# Patient Record
Sex: Female | Born: 1955
Health system: Southern US, Community
[De-identification: ages and names within clinical notes are randomized; demographics above are authoritative.]

## PROBLEM LIST (undated history)

## (undated) DIAGNOSIS — J302 Other seasonal allergic rhinitis: Secondary | ICD-10-CM

## (undated) DIAGNOSIS — F329 Major depressive disorder, single episode, unspecified: Secondary | ICD-10-CM

## (undated) DIAGNOSIS — I10 Essential (primary) hypertension: Secondary | ICD-10-CM

## (undated) DIAGNOSIS — F419 Anxiety disorder, unspecified: Secondary | ICD-10-CM

## (undated) DIAGNOSIS — F32A Depression, unspecified: Secondary | ICD-10-CM

## (undated) DIAGNOSIS — T7840XA Allergy, unspecified, initial encounter: Secondary | ICD-10-CM

## (undated) DIAGNOSIS — D573 Sickle-cell trait: Secondary | ICD-10-CM

## (undated) DIAGNOSIS — N3281 Overactive bladder: Secondary | ICD-10-CM

## (undated) DIAGNOSIS — Z862 Personal history of diseases of the blood and blood-forming organs and certain disorders involving the immune mechanism: Secondary | ICD-10-CM

## (undated) DIAGNOSIS — M1990 Unspecified osteoarthritis, unspecified site: Secondary | ICD-10-CM

## (undated) DIAGNOSIS — D259 Leiomyoma of uterus, unspecified: Secondary | ICD-10-CM

## (undated) DIAGNOSIS — M545 Low back pain, unspecified: Secondary | ICD-10-CM

## (undated) DIAGNOSIS — Z789 Other specified health status: Secondary | ICD-10-CM

## (undated) HISTORY — DX: Depression, unspecified: F32.A

## (undated) HISTORY — DX: Essential (primary) hypertension: I10

## (undated) HISTORY — DX: Low back pain: M54.5

## (undated) HISTORY — DX: Anxiety disorder, unspecified: F41.9

## (undated) HISTORY — DX: Leiomyoma of uterus, unspecified: D25.9

## (undated) HISTORY — DX: Sickle-cell trait: D57.3

## (undated) HISTORY — DX: Personal history of diseases of the blood and blood-forming organs and certain disorders involving the immune mechanism: Z86.2

## (undated) HISTORY — PX: TUBAL LIGATION: SHX77

## (undated) HISTORY — DX: Unspecified osteoarthritis, unspecified site: M19.90

## (undated) HISTORY — DX: Major depressive disorder, single episode, unspecified: F32.9

## (undated) HISTORY — PX: TUMOR REMOVAL: SHX12

## (undated) HISTORY — DX: Other seasonal allergic rhinitis: J30.2

## (undated) HISTORY — DX: Overactive bladder: N32.81

## (undated) HISTORY — DX: Low back pain, unspecified: M54.50

## (undated) HISTORY — DX: Other specified health status: Z78.9

## (undated) HISTORY — DX: Allergy, unspecified, initial encounter: T78.40XA

---

## 1985-10-11 HISTORY — PX: BUNIONECTOMY: SHX129

## 2005-10-11 ENCOUNTER — Encounter (INDEPENDENT_AMBULATORY_CARE_PROVIDER_SITE_OTHER): Payer: Self-pay | Admitting: Family Medicine

## 2005-10-11 LAB — CONVERTED CEMR LAB

## 2006-05-31 ENCOUNTER — Ambulatory Visit: Payer: Self-pay | Admitting: Family Medicine

## 2006-06-01 ENCOUNTER — Ambulatory Visit: Payer: Self-pay | Admitting: *Deleted

## 2006-06-20 ENCOUNTER — Ambulatory Visit: Payer: Self-pay | Admitting: Family Medicine

## 2006-06-20 LAB — CONVERTED CEMR LAB
RBC count: 4.84 10*6/uL
TSH: 1.075 microintl units/mL
WBC, blood: 5.5 10*3/uL

## 2006-06-22 ENCOUNTER — Ambulatory Visit (HOSPITAL_COMMUNITY): Admission: RE | Admit: 2006-06-22 | Discharge: 2006-06-22 | Payer: Self-pay | Admitting: Family Medicine

## 2006-09-13 ENCOUNTER — Ambulatory Visit: Payer: Self-pay | Admitting: Family Medicine

## 2006-11-29 ENCOUNTER — Ambulatory Visit: Payer: Self-pay | Admitting: Internal Medicine

## 2007-03-14 ENCOUNTER — Emergency Department (HOSPITAL_COMMUNITY): Admission: EM | Admit: 2007-03-14 | Discharge: 2007-03-14 | Payer: Self-pay | Admitting: Emergency Medicine

## 2007-05-11 ENCOUNTER — Encounter (INDEPENDENT_AMBULATORY_CARE_PROVIDER_SITE_OTHER): Payer: Self-pay | Admitting: Gynecology

## 2007-05-11 ENCOUNTER — Ambulatory Visit: Payer: Self-pay | Admitting: Gynecology

## 2007-05-19 ENCOUNTER — Ambulatory Visit: Payer: Self-pay | Admitting: Family Medicine

## 2007-05-19 LAB — CONVERTED CEMR LAB: Urinalysis: NORMAL

## 2007-05-23 ENCOUNTER — Ambulatory Visit (HOSPITAL_COMMUNITY): Admission: RE | Admit: 2007-05-23 | Discharge: 2007-05-23 | Payer: Self-pay | Admitting: Family Medicine

## 2007-05-23 LAB — HM MAMMOGRAPHY: HM Mammogram: NORMAL

## 2007-06-15 ENCOUNTER — Encounter (INDEPENDENT_AMBULATORY_CARE_PROVIDER_SITE_OTHER): Payer: Self-pay | Admitting: Family Medicine

## 2007-06-15 DIAGNOSIS — I10 Essential (primary) hypertension: Secondary | ICD-10-CM | POA: Insufficient documentation

## 2007-06-15 DIAGNOSIS — F341 Dysthymic disorder: Secondary | ICD-10-CM | POA: Insufficient documentation

## 2007-06-20 DIAGNOSIS — M5430 Sciatica, unspecified side: Secondary | ICD-10-CM | POA: Insufficient documentation

## 2007-06-20 DIAGNOSIS — D259 Leiomyoma of uterus, unspecified: Secondary | ICD-10-CM | POA: Insufficient documentation

## 2007-06-28 ENCOUNTER — Encounter (INDEPENDENT_AMBULATORY_CARE_PROVIDER_SITE_OTHER): Payer: Self-pay | Admitting: *Deleted

## 2007-07-20 ENCOUNTER — Ambulatory Visit: Payer: Self-pay | Admitting: Internal Medicine

## 2007-07-26 ENCOUNTER — Ambulatory Visit: Payer: Self-pay | Admitting: Family Medicine

## 2007-07-26 LAB — CONVERTED CEMR LAB
Hepatitis B-Post: 9.9 milliintl units/mL
Mumps IgG: 2.45 — ABNORMAL HIGH
Rubella: 85.6 intl units/mL — ABNORMAL HIGH
Rubeola IgG: 1.97 — ABNORMAL HIGH
Varicella IgG: 2.83 — ABNORMAL HIGH

## 2007-07-29 ENCOUNTER — Emergency Department (HOSPITAL_COMMUNITY): Admission: EM | Admit: 2007-07-29 | Discharge: 2007-07-29 | Payer: Self-pay | Admitting: Emergency Medicine

## 2007-10-24 ENCOUNTER — Ambulatory Visit: Payer: Self-pay | Admitting: Family Medicine

## 2007-10-30 ENCOUNTER — Emergency Department (HOSPITAL_COMMUNITY): Admission: EM | Admit: 2007-10-30 | Discharge: 2007-10-30 | Payer: Self-pay | Admitting: Emergency Medicine

## 2008-03-08 ENCOUNTER — Emergency Department (HOSPITAL_COMMUNITY): Admission: EM | Admit: 2008-03-08 | Discharge: 2008-03-08 | Payer: Self-pay | Admitting: Emergency Medicine

## 2008-03-18 ENCOUNTER — Ambulatory Visit: Payer: Self-pay | Admitting: Internal Medicine

## 2008-03-18 DIAGNOSIS — M545 Low back pain, unspecified: Secondary | ICD-10-CM | POA: Insufficient documentation

## 2008-04-15 ENCOUNTER — Telehealth: Payer: Self-pay | Admitting: Internal Medicine

## 2008-04-24 ENCOUNTER — Ambulatory Visit: Payer: Self-pay | Admitting: Internal Medicine

## 2008-06-12 ENCOUNTER — Ambulatory Visit: Payer: Self-pay | Admitting: Internal Medicine

## 2008-06-12 LAB — CONVERTED CEMR LAB
ALT: 20 units/L (ref 0–35)
AST: 25 units/L (ref 0–37)
Albumin: 4.5 g/dL (ref 3.5–5.2)
Alkaline Phosphatase: 79 units/L (ref 39–117)
BUN: 11 mg/dL (ref 6–23)
Basophils Absolute: 0 10*3/uL (ref 0.0–0.1)
Basophils Relative: 0 % (ref 0–1)
CO2: 26 meq/L (ref 19–32)
Calcium: 9.2 mg/dL (ref 8.4–10.5)
Chloride: 105 meq/L (ref 96–112)
Cholesterol: 217 mg/dL — ABNORMAL HIGH (ref 0–200)
Creatinine, Ser: 0.97 mg/dL (ref 0.40–1.20)
Eosinophils Absolute: 0.2 10*3/uL (ref 0.0–0.7)
Eosinophils Relative: 4 % (ref 0–5)
Glucose, Bld: 98 mg/dL (ref 70–99)
HCT: 35.4 % — ABNORMAL LOW (ref 36.0–46.0)
HDL: 66 mg/dL (ref >39)
Hemoglobin: 11.5 g/dL — ABNORMAL LOW (ref 12.0–15.0)
LDL Cholesterol: 139 mg/dL — ABNORMAL HIGH (ref 0–99)
Lymphocytes Relative: 34 % (ref 12–46)
Lymphs Abs: 1.8 10*3/uL (ref 0.7–4.0)
MCHC: 32.5 g/dL (ref 30.0–36.0)
MCV: 78.7 fL (ref 78.0–100.0)
Microalb, Ur: 2.98 mg/dL — ABNORMAL HIGH (ref 0.00–1.89)
Monocytes Absolute: 0.4 10*3/uL (ref 0.1–1.0)
Monocytes Relative: 7 % (ref 3–12)
Neutro Abs: 2.9 10*3/uL (ref 1.7–7.7)
Neutrophils Relative %: 55 % (ref 43–77)
Platelets: 235 10*3/uL (ref 150–400)
Potassium: 3.8 meq/L (ref 3.5–5.3)
RBC: 4.5 M/uL (ref 3.87–5.11)
RDW: 16.2 % — ABNORMAL HIGH (ref 11.5–15.5)
Sodium: 142 meq/L (ref 135–145)
Total Bilirubin: 0.3 mg/dL (ref 0.3–1.2)
Total CHOL/HDL Ratio: 3.3
Total Protein: 7.5 g/dL (ref 6.0–8.3)
Triglycerides: 62 mg/dL (ref <150)
VLDL: 12 mg/dL (ref 0–40)
WBC: 5.4 10*3/uL (ref 4.0–10.5)

## 2008-08-07 ENCOUNTER — Telehealth: Payer: Self-pay | Admitting: Internal Medicine

## 2008-10-24 ENCOUNTER — Ambulatory Visit: Payer: Self-pay | Admitting: Internal Medicine

## 2008-10-24 DIAGNOSIS — N949 Unspecified condition associated with female genital organs and menstrual cycle: Secondary | ICD-10-CM

## 2008-10-24 DIAGNOSIS — N938 Other specified abnormal uterine and vaginal bleeding: Secondary | ICD-10-CM | POA: Insufficient documentation

## 2008-10-24 DIAGNOSIS — N925 Other specified irregular menstruation: Secondary | ICD-10-CM | POA: Insufficient documentation

## 2008-10-25 ENCOUNTER — Encounter: Payer: Self-pay | Admitting: Internal Medicine

## 2008-10-25 ENCOUNTER — Telehealth: Payer: Self-pay | Admitting: Internal Medicine

## 2008-10-26 ENCOUNTER — Encounter: Payer: Self-pay | Admitting: Internal Medicine

## 2008-10-28 ENCOUNTER — Telehealth: Payer: Self-pay | Admitting: Internal Medicine

## 2008-10-29 ENCOUNTER — Encounter: Payer: Self-pay | Admitting: Internal Medicine

## 2008-10-29 ENCOUNTER — Telehealth: Payer: Self-pay | Admitting: Internal Medicine

## 2008-11-05 ENCOUNTER — Telehealth: Payer: Self-pay | Admitting: Internal Medicine

## 2008-12-16 ENCOUNTER — Ambulatory Visit: Payer: Self-pay | Admitting: Family Medicine

## 2008-12-23 ENCOUNTER — Ambulatory Visit (HOSPITAL_COMMUNITY): Admission: RE | Admit: 2008-12-23 | Discharge: 2008-12-23 | Payer: Self-pay | Admitting: Internal Medicine

## 2009-02-13 ENCOUNTER — Ambulatory Visit: Payer: Self-pay | Admitting: Family Medicine

## 2009-05-22 ENCOUNTER — Ambulatory Visit: Payer: Self-pay | Admitting: Family Medicine

## 2009-05-22 LAB — CONVERTED CEMR LAB
BUN: 7 mg/dL (ref 6–23)
CO2: 23 meq/L (ref 19–32)
Calcium: 8.4 mg/dL (ref 8.4–10.5)
Chloride: 105 meq/L (ref 96–112)
Creatinine, Ser: 0.82 mg/dL (ref 0.40–1.20)
Glucose, Bld: 119 mg/dL — ABNORMAL HIGH (ref 70–99)
Potassium: 3.6 meq/L (ref 3.5–5.3)
Sodium: 137 meq/L (ref 135–145)
TSH: 1.139 microintl units/mL (ref 0.350–4.500)
Vit D, 25-Hydroxy: 12 ng/mL — ABNORMAL LOW (ref 30–89)

## 2009-07-29 ENCOUNTER — Encounter: Admission: RE | Admit: 2009-07-29 | Discharge: 2009-09-16 | Payer: Self-pay | Admitting: Family Medicine

## 2009-12-19 ENCOUNTER — Ambulatory Visit: Payer: Self-pay | Admitting: Internal Medicine

## 2009-12-19 DIAGNOSIS — E669 Obesity, unspecified: Secondary | ICD-10-CM | POA: Insufficient documentation

## 2010-01-02 ENCOUNTER — Encounter: Payer: Self-pay | Admitting: Internal Medicine

## 2010-01-09 ENCOUNTER — Telehealth: Payer: Self-pay

## 2010-02-20 ENCOUNTER — Telehealth: Payer: Self-pay | Admitting: Internal Medicine

## 2010-02-24 ENCOUNTER — Encounter: Admission: RE | Admit: 2010-02-24 | Discharge: 2010-03-12 | Payer: Self-pay | Admitting: Internal Medicine

## 2010-03-02 ENCOUNTER — Encounter: Payer: Self-pay | Admitting: Internal Medicine

## 2010-04-02 ENCOUNTER — Encounter: Payer: Self-pay | Admitting: Internal Medicine

## 2010-04-09 ENCOUNTER — Ambulatory Visit: Payer: Self-pay | Admitting: Family Medicine

## 2010-06-22 ENCOUNTER — Telehealth: Payer: Self-pay | Admitting: Internal Medicine

## 2010-07-17 ENCOUNTER — Telehealth: Payer: Self-pay | Admitting: Internal Medicine

## 2010-11-10 NOTE — Letter (Signed)
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   [place], [place] [address]   Phone: [phone]  Fax: [phone]    October 25, 2008   Employee:  Annetta Maw    To Whom It May Concern:   For Medical reasons, please excuse the above named employee from work for the following dates:  Start:   10-24-08  End:   10-31-08  If you need additional information, please feel free to contact our office.         Sincerely,    Gordy Savers  MD  Appended Document: Out of Work ok to return to work 11/04/08 per PK.

## 2010-11-10 NOTE — Miscellaneous (Signed)
Summary: Initial Summary for PT Services/[facility]  Initial Summary for PT Services/[facility]   Imported By: Maryln Gottron 03/05/2010 15:34:53  _____________________________________________________________________  External Attachment:    Type:   Image     Comment:   External Document

## 2010-11-10 NOTE — Progress Notes (Signed)
Summary: Request to Switch PCP  Phone Note Call from Patient Call back at [phone]   Caller: Patient Summary of Call: Pt requests tranfer to Dr. Fabian Sharp.  Please advise if pt can switch pcp. Initial call taken by: Heron Sabins,  July 17, 2010 9:31 AM  Follow-up for Phone Call        ok     if ok with Dr Kirtland Bouchard but  remind her she will be charged for no show appts.   Follow-up by: Madelin Headings MD,  July 19, 2010 10:04 PM  Additional Follow-up for Phone Call Additional follow up Details #1::        OK Additional Follow-up by: Gordy Savers  MD,  July 21, 2010 12:43 PM    Additional Follow-up for Phone Call Additional follow up Details #2::    lmom Follow-up by: Heron Sabins,  July 23, 2010 8:06 AM  Additional Follow-up for Phone Call Additional follow up Details #3:: Details for Additional Follow-up Action Taken: PT IS AWARE OK TO SWITCH TO [facility] Additional Follow-up by: Heron Sabins,  July 27, 2010 9:20 AM

## 2010-11-10 NOTE — Progress Notes (Signed)
Summary: pt ins will not pay for Detrol can she chg to [employer] or [employer] Note Other Incoming   Call placed by: [facility] Call placed to: k Summary of Call: patient insurance will no longer pay for Detrol LA.  They will pay for Enablex or Vesicare.  Can the patient change to one of these?   Initial call taken by: Roselle Locus,  October 28, 2008 3:10 PM  Follow-up for Phone Call        vesicare 15 mg  #90 one daily  RF 4 Follow-up by: Gordy Savers  MD,  October 28, 2008 5:20 PM      Appended Document: pt ins will not pay for Detrol can she chg to Enablex or Vesicar Dr Kirtland Bouchard wrote Vesicare rx on prior authorization form and I faxed it to [facility]   Appended Document: pt ins will not pay for Detrol can she chg to Enablex or Vesicar Medications Added VESICARE 10 MG TABS (SOLIFENACIN SUCCINATE) 1 once daily          Clinical Lists Changes  Medications: Removed medication of DETROL LA 4 MG  CP24 (TOLTERODINE TARTRATE) 1 cap by mouth daily Added new medication of VESICARE 10 MG TABS (SOLIFENACIN SUCCINATE) 1 once daily - Signed Rx of VESICARE 10 MG TABS (SOLIFENACIN SUCCINATE) 1 once daily;  #90 x 4;  Signed;  Entered by: Raechel Ache, RN;  Authorized by: Gordy Savers  MD;  Method used: Historical    Prescriptions: VESICARE 10 MG TABS (SOLIFENACIN SUCCINATE) 1 once daily  #90 x 4   Entered by:   Raechel Ache, RN   Authorized by:   Gordy Savers  MD   Signed by:   Raechel Ache, RN on 10/29/2008   Method used:   Historical   RxID:   [id number]

## 2010-11-10 NOTE — Progress Notes (Signed)
Summary: EFFEXOR XR 75 MG 90 PER MONTH AT LOCAL PHARMACY   Phone Note Other Incoming   Call placed by: [facility] Call placed to: K Summary of Call: EFFEXOR XR 75 MG DOES NOT REQUIRE PRIOR AUTH.  SHE CAN GET 90 PER 30 DAYS FROM LOCAL PHARMACY AND 270 IF SHE USES MAIL ORDER WITH NO PRIOR AUTH  Initial call taken by: Roselle Locus,  November 05, 2008 9:39 AM

## 2010-11-10 NOTE — Progress Notes (Signed)
Summary: rx refill  Phone Note Call from Patient Call back at [phone]   Caller: pt live Call For: K Summary of Call: Patient needs a rx refill for hctz .    [facility] [address]. Initial call taken by: Celine Ahr,  August 07, 2008 2:41 PM      Prescriptions: TRIAMTERENE-HCTZ 37.5-25 MG  CAPS (TRIAMTERENE-HCTZ) one daily  #90 x 3   Entered by:   Raechel Ache, RN   Authorized by:   Gordy Savers  MD   Signed by:   Raechel Ache, RN on 08/08/2008   Method used:   Electronically to        [facility] [address] (retail)       [phone]       [place], [place]  [address]       Ph: [phone]       Fax: [phone]   RxID:   [id number]

## 2010-11-10 NOTE — Progress Notes (Signed)
----   Converted from flag ---- ---- 10/29/2008 12:27 PM, Gordy Savers  MD wrote: 10 mg  ---- 10/29/2008 11:38 AM, Raechel Ache, RN wrote: Assunta Found comes only in 5mg  or 10. ------------------------------

## 2010-11-10 NOTE — Medication Information (Signed)
Summary: Quantity Limit Exception Request Form  Quantity Limit Exception Request Form   Imported By: Maryln Gottron 01/16/2010 13:09:15  _____________________________________________________________________  External Attachment:    Type:   Image     Comment:   External Document

## 2010-11-10 NOTE — Medication Information (Signed)
Summary: Refill Authorization Request-Detrol LA  Refill Authorization Request-Detrol LA   Imported By: Maryln Gottron 11/04/2008 14:37:40  _____________________________________________________________________  External Attachment:    Type:   Image     Comment:   External Document

## 2010-11-10 NOTE — Progress Notes (Signed)
Summary: NO CALL NO SHOW CPX  Phone Note Outgoing Call   Call placed by: Duard Brady LPN,  June 22, 2010 2:48 PM Call placed to: Patient Summary of Call: attempted to call hm# -  "the number or code you have dialed in incorrect , please check the number and try again"   NO CALL NO SHOW for CPX  Initial call taken by: Duard Brady LPN,  June 22, 2010 2:50 PM

## 2010-11-10 NOTE — Progress Notes (Signed)
Summary: out of work note  Phone Note Call from Patient   Caller: Patient Call For: Dr. Kirtland Bouchard Summary of Call: Pt wants an OOW note for next M, T, and Wednesday due to menorrhagia. Call when ready and leave voice message. [phone] Initial call taken by: Lynann Beaver CMA,  October 25, 2008 10:56 AM
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
## 2010-11-10 NOTE — Progress Notes (Signed)
Summary: REQUEST FOR REFERRAL  Phone Note Call from Patient   Caller: Patient    [phone] Summary of Call: Pt called in to request that the referral for a Nutritionist and for Physical Therapy be redone.... Pt adv that she never went to her appts that were scheduled and she wants to have another appt made for her so she can see a Nutritionist and have Physical Therapy.... Pt can be reached at [phone] with any questions or concerns.  Initial call taken by: Debbra Riding,  Feb 20, 2010 12:20 PM  Follow-up for Phone Call        ok to reschedule Follow-up by: Gordy Savers  MD,  Feb 23, 2010 8:01 AM  Additional Follow-up for Phone Call Additional follow up Details #1::        orders done and sent to terri. KIK Additional Follow-up by: Duard Brady LPN,  Feb 23, 2010 11:40 AM

## 2010-11-10 NOTE — Progress Notes (Signed)
Summary: Ins and medication rx  Phone Note Outgoing Call   Call placed by: Duard Brady LPN,  January 09, 2010 4:43 PM Call placed to: Patient Summary of Call: attempt to call r/t  effexor and ins not paying for rx the way it is written. LMTCB to discuss. KIK Initial call taken by: Duard Brady LPN,  January 09, 2010 4:44 PM

## 2010-11-10 NOTE — Assessment & Plan Note (Signed)
Summary: to be est/mhf   Vital Signs:  Patient Profile:   55 Years Old Female Weight:      208 pounds Temp:     98.3 degrees F oral Pulse rate:   92 / minute Pulse rhythm:   regular BP sitting:   162 / 88  (left arm) Cuff size:   regular  Vitals Entered By: Raechel Ache, RN (March 18, 2008 3:13 PM)                 Chief Complaint:  New- to establish. C/o high BP.Marland Kitchen  History of Present Illness: 55 year old, African-American female, who seen to establish with our practice.  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 otherwise she feels well, without concerns or complaints.  Father with a history of hypertension    Current Allergies: No known allergies   Past Medical History:    Reviewed history and no changes required:       Allergic rhinitis       Depression       Low back pain  Past Surgical History:    Reviewed history from 06/15/2007 and no changes required:       S/P Caesarean section and Tubal ligation   (1994)       S/P Foot surgery  (1989)       S/P Removal of Tumor behind Left Eye  (1997)          Family History:    Reviewed history and no changes required:       father died recently at age 21.  History senile dementia, and diabetes, hypertension              Mother is 55 things be in reasonably good health, but daughter unaware of health specifics              One brother in good health       3 sisters positive for schizophrenia and bipolar disorder  Social History:    Single    works in [place] at the psychiatric facility    Never Smoked   Risk Factors:  Tobacco use:  never   Review of Systems  The patient denies anorexia, fever, weight loss, weight gain, vision loss, decreased hearing, hoarseness, chest pain, syncope, dyspnea on exertion, peripheral edema, prolonged cough, headaches, hemoptysis, abdominal pain, melena, hematochezia, severe  indigestion/heartburn, hematuria, incontinence, genital sores, muscle weakness, suspicious skin lesions, transient blindness, difficulty walking, depression, unusual weight change, abnormal bleeding, enlarged lymph nodes, angioedema, and breast masses.     Physical Exam  General:     overweight-appearing.  160/86 Head:     Normocephalic and atraumatic without obvious abnormalities. No apparent alopecia or balding. Eyes:     No corneal or conjunctival inflammation noted. EOMI. Perrla. Funduscopic exam benign, without hemorrhages, exudates or papilledema. Vision grossly normal. Ears:     External ear exam shows no significant lesions or deformities.  Otoscopic examination reveals clear canals, tympanic membranes are intact bilaterally without bulging, retraction, inflammation or discharge. Hearing is grossly normal bilaterally. Mouth:     Oral mucosa and oropharynx without lesions or exudates.  Teeth in good repair. Neck:     No deformities, masses, or tenderness noted. Chest Wall:     No deformities, masses, or tenderness noted. Breasts:     No mass, nodules, thickening, tenderness, bulging, retraction, inflamation, nipple discharge or  skin changes noted.   Lungs:     Normal respiratory effort, chest expands symmetrically. Lungs are clear to auscultation, no crackles or wheezes. Heart:     Normal rate and regular rhythm. S1 and S2 normal without gallop, murmur, click, rub or other extra sounds. Abdomen:     Bowel sounds positive,abdomen soft and non-tender without masses, organomegaly or hernias noted. Msk:     No deformity or scoliosis noted of thoracic or lumbar spine.   Pulses:     R and L carotid,radial,femoral,dorsalis pedis and posterior tibial pulses are full and equal bilaterally Extremities:     No clubbing, cyanosis, edema, or deformity noted with normal full range of motion of all joints.   Neurologic:     No cranial nerve deficits noted. Station and gait are normal.  Plantar reflexes are down-going bilaterally. DTRs are symmetrical throughout. Sensory, motor and coordinative functions appear intact. Skin:     Intact without suspicious lesions or rashes Cervical Nodes:     No lymphadenopathy noted Axillary Nodes:     No palpable lymphadenopathy Inguinal Nodes:     No significant adenopathy Psych:     Cognition and judgment appear intact. Alert and cooperative with normal attention span and concentration. No apparent delusions, illusions, hallucinations    Impression & Recommendations:  Problem # 1:  HYPERTENSION, BENIGN (ICD-401.1)  Her updated medication list for this problem includes:    Triamterene-hctz 37.5-25 Mg Caps (Triamterene-hctz) ..... One daily   Problem # 2:  DEPRESSION (ICD-311)  Her updated medication list for this problem includes:    Effexor Xr 75 Mg Cp24 (Venlafaxine hcl) .Marland KitchenMarland KitchenMarland KitchenMarland Kitchen 3 caps by mouth daily   Problem # 3:  ALLERGIC RHINITIS (ICD-477.9)  Complete Medication List: 1)  Effexor Xr 75 Mg Cp24 (Venlafaxine hcl) .... 3 caps by mouth daily 2)  Ibuprofen 800 Mg Tabs (Ibuprofen) .Marland Kitchen.. 1 tab by mouth every 8 hours as needed for pain 3)  Detrol La 4 Mg Cp24 (Tolterodine tartrate) .Marland Kitchen.. 1 cap by mouth daily 4)  Arthrotec 75 75-200 Mg-mcg Tabs (Diclofenac-misoprostol) .Marland Kitchen.. 1 tab by mouth two times a day with meals 5)  Hemocyte Plus 106-1 Mg Caps (B complex-c-min-fe-fa) .Marland Kitchen.. 1 cap by mouth q day with orange juice 6)  Loratidine 10 Mg  .Marland Kitchen.. 1 tab by mouth daily for allergies 7)  Triamterene-hctz 37.5-25 Mg Caps (Triamterene-hctz) .... One daily   Patient Instructions: 1)  Please schedule a follow-up appointment in 1 month. 2)  Limit your Sodium (Salt) to less than 2 grams a day(slightly less than 1/2 a teaspoon) to prevent fluid retention, swelling, or worsening of symptoms. 3)  It is important that you exercise regularly at least 20 minutes 5 times a week. If you develop chest pain, have severe difficulty breathing, or feel  very tired , stop exercising immediately and seek medical attention. 4)  You need to lose weight. Consider a lower calorie diet and regular exercise.    Prescriptions: TRIAMTERENE-HCTZ 37.5-25 MG  CAPS (TRIAMTERENE-HCTZ) one daily  #90 x 2   Entered and Authorized by:   Gordy Savers  MD   Signed by:   Gordy Savers  MD on 03/18/2008   Method used:   Print then Give to Patient   RxID:   [id number] ARTHROTEC 75 75-200 MG-MCG  TABS (DICLOFENAC-MISOPROSTOL) 1 tab by mouth two times a day with meals  #180 x 4   Entered and Authorized by:   Gordy Savers  MD   Signed  by:   Gordy Savers  MD on 03/18/2008   Method used:   Print then Give to Patient   RxID:   [id number] DETROL LA 4 MG  CP24 (TOLTERODINE TARTRATE) 1 cap by mouth daily  #90 x 4   Entered and Authorized by:   Gordy Savers  MD   Signed by:   Gordy Savers  MD on 03/18/2008   Method used:   Print then Give to Patient   RxID:   [id number] EFFEXOR XR 75 MG  CP24 (VENLAFAXINE HCL) 3 caps by mouth daily  #270 x 4   Entered and Authorized by:   Gordy Savers  MD   Signed by:   Gordy Savers  MD on 03/18/2008   Method used:   Print then Give to Patient   RxID:   [id number]  ]

## 2010-11-10 NOTE — Progress Notes (Signed)
Summary: REQUEST FOR REFERRAL  Phone Note Call from Patient   Caller: Patient   [phone] Reason for Call: Talk to Doctor Summary of Call: Pt called in to request that the referral for a Nutritionist and for Physical Therapy be redone.... Pt adv that she never went to her appts that were scheduled and she wants to have another appt made for her so she can see a Nutritionist and have Physical Therapy.... Pt can be reached at [phone] with any questions or concerns. Initial call taken by: Debbra Riding,  Feb 20, 2010 12:18 PM     Appended Document: REQUEST FOR REFERRAL NEW NOTATION MADE.... ACCIDENTALLY SIGNED OFF BEFORE IT WAS SENT....RS

## 2010-11-10 NOTE — Letter (Signed)
Summary: No Show for Appt./[facility]  No Show for Appt./[facility]   Imported By: Maryln Gottron 04/09/2010 14:17:17  _____________________________________________________________________  External Attachment:    Type:   Image     Comment:   External Document

## 2010-11-10 NOTE — Progress Notes (Signed)
Summary: NO SHOW  Phone Note Outgoing Call   Call placed by: Raechel Ache, RN,  April 15, 2008 10:36 AM Summary of Call: Attempted to call re missed appt; phone disconnected.

## 2010-11-10 NOTE — Medication Information (Signed)
Summary: Refill Request for Effixor XR/[facility]  Refill Request for Effixor XR/[facility]   Imported By: Maryln Gottron 11/05/2008 14:52:49  _____________________________________________________________________  External Attachment:    Type:   Image     Comment:   External Document

## 2010-11-23 ENCOUNTER — Emergency Department (HOSPITAL_COMMUNITY): Payer: Self-pay

## 2010-11-23 ENCOUNTER — Emergency Department (HOSPITAL_COMMUNITY)
Admission: EM | Admit: 2010-11-23 | Discharge: 2010-11-24 | Disposition: A | Discharge status: Home / Self Care | Payer: Self-pay | Attending: Emergency Medicine | Admitting: Emergency Medicine

## 2010-11-23 DIAGNOSIS — I10 Essential (primary) hypertension: Secondary | ICD-10-CM | POA: Insufficient documentation

## 2010-11-23 DIAGNOSIS — J984 Other disorders of lung: Secondary | ICD-10-CM | POA: Insufficient documentation

## 2010-11-23 DIAGNOSIS — R0789 Other chest pain: Secondary | ICD-10-CM | POA: Insufficient documentation

## 2010-11-23 LAB — COMPREHENSIVE METABOLIC PANEL
ALT: 59 U/L — ABNORMAL HIGH (ref 0–35)
AST: 36 U/L (ref 0–37)
Albumin: 3.9 g/dL (ref 3.5–5.2)
Alkaline Phosphatase: 85 U/L (ref 39–117)
BUN: 11 mg/dL (ref 6–23)
CO2: 30 mEq/L (ref 19–32)
Calcium: 9.5 mg/dL (ref 8.4–10.5)
Chloride: 104 mEq/L (ref 96–112)
Creatinine, Ser: 0.99 mg/dL (ref 0.4–1.2)
GFR calc non Af Amer: 58 mL/min — ABNORMAL LOW (ref >60)
Glucose, Bld: 105 mg/dL — ABNORMAL HIGH (ref 70–99)
Potassium: 3.7 mEq/L (ref 3.5–5.1)
Sodium: 141 mEq/L (ref 135–145)
Total Bilirubin: 0.3 mg/dL (ref 0.3–1.2)
Total Protein: 7.2 g/dL (ref 6.0–8.3)

## 2010-11-23 LAB — CBC
HCT: 36.8 % (ref 36.0–46.0)
Hemoglobin: 12.2 g/dL (ref 12.0–15.0)
MCH: 26.1 pg (ref 26.0–34.0)
MCHC: 33.2 g/dL (ref 30.0–36.0)
MCV: 78.6 fL (ref 78.0–100.0)
Platelets: 253 10*3/uL (ref 150–400)
RBC: 4.68 MIL/uL (ref 3.87–5.11)
RDW: 14.2 % (ref 11.5–15.5)
WBC: 8.2 10*3/uL (ref 4.0–10.5)

## 2010-11-23 LAB — DIFFERENTIAL
Basophils Absolute: 0 10*3/uL (ref 0.0–0.1)
Basophils Relative: 0 % (ref 0–1)
Eosinophils Absolute: 0.2 10*3/uL (ref 0.0–0.7)
Eosinophils Relative: 2 % (ref 0–5)
Lymphocytes Relative: 36 % (ref 12–46)
Lymphs Abs: 3 10*3/uL (ref 0.7–4.0)
Monocytes Absolute: 0.5 10*3/uL (ref 0.1–1.0)
Monocytes Relative: 6 % (ref 3–12)
Neutro Abs: 4.5 10*3/uL (ref 1.7–7.7)
Neutrophils Relative %: 55 % (ref 43–77)

## 2010-11-23 LAB — POCT CARDIAC MARKERS
CKMB, poc: 6.7 ng/mL (ref 1.0–8.0)
Myoglobin, poc: 160 ng/mL (ref 12–200)
Troponin i, poc: <0.05 ng/mL (ref 0.00–0.09)

## 2011-02-09 ENCOUNTER — Telehealth: Payer: Self-pay | Admitting: Internal Medicine

## 2011-02-09 NOTE — Telephone Encounter (Signed)
ok 

## 2011-02-09 NOTE — Telephone Encounter (Signed)
Dr. Fabian Sharp please advise if ok with request to switch pcp

## 2011-02-09 NOTE — Telephone Encounter (Signed)
Pt called and is req to change pcps from Dr Amador Cunas to Dr Fabian Sharp, because pt is req a female doctor. Pls advise if ok.

## 2011-02-11 NOTE — Telephone Encounter (Signed)
OK 

## 2011-02-11 NOTE — Telephone Encounter (Signed)
Lft vm for pt re: change of pcp. Waiting on cb.

## 2011-02-23 NOTE — Group Therapy Note (Signed)
NAME:  Rachel Fields, Rachel Fields NO.:  [id number]   MEDICAL RECORD NO.:  [id number]          PATIENT TYPE:  WOC   LOCATION:  [facility]                   FACILITY:  [facility]   PHYSICIAN:  Ginger Carne, MD DATE OF BIRTH:  01-20-1956   DATE OF SERVICE:                                  CLINIC NOTE   CHIEF COMPLAINT:  Fibroids.   HISTORY OF PRESENT ILLNESS:  This is a 55 year old, gravida 3 para 3,  African-American female who was diagnosed with fibroids on a pelvic  ultrasound performed in September 2007 for pelvic pain. There were 2  fibroids, the largest of which was 4 cm. Since that time, she reports a  moderate amount of pain with her menstrual cycle. She occasionally has  to take Motrin which does help with the pain. She reports regular  periods every month that last approximately 7 days. The flow is moderate  and there is no bleeding in between periods. The pain with her menstrual  cycle is not debilitating and she is able to go to work despite the  pain.   GYNECOLOGICAL HISTORY:  Her last pap smear was in April 2007. She has  never had any abnormal pap smear in the past.   SURGICAL HISTORY:  She reports that she had an endometrial ablation  performed in [place], [place] in January 2006 due to heavy periods.   FAMILY HISTORY:  The patient's father has diabetes and high blood  pressure.   CURRENT MEDICATIONS:  1. Effexor.  2. Iron sulfate.  3. Biotin.  4. Detrol LA.   ALLERGIES:  The patient is allergic to LATEX.   VITAL SIGNS:  Pulse 103, blood pressure 154/93, weight 190.2 pounds,  repeat blood pressure was 142/75.   PHYSICAL EXAMINATION:  GENERAL:  The patient is alert and pleasant.  HEART:  Regular rate and rhythm without murmur.  LUNGS:  Clear to auscultation bilaterally with normal effort.  BREASTS:  No skin changes. No nipple discharge. No obvious masses  palpated.  GENITOURINARY:  Introitus is normal with no external lesion. Vagina is  pink and  well rugated with some thin watery clear discharge present.  Uterus appears normal with no lesion. There is no adnexal tenderness or  masses.   ASSESSMENT AND PLAN:  This is a 55 year old African-American female with  a history of fibroids.  1. Fibroids. The patient is having regular menstrual cycles with a      normal amount of flow. She is having some pain with her periods and      I have recommended that she continue taking Motrin as needed for      the pain. I have reassured her that fibroids are very common and      normally do not need any type of surgical procedure.  2. Hypertension. The patient's blood pressure today is mildly      elevated. She will follow up with her regular doctor at the      [facility] clinic.  3. Cervical cancer screening. A pap smear was performed today.  4. Breast cancer screening. The patient was referred for mammography  through the assistance program.     ______________________________  Elsie Lincoln, MD    ______________________________  Ginger Carne, MD    KL/MEDQ  D:  05/11/2007  T:  05/12/2007  Job:  [id number]

## 2011-02-23 NOTE — Telephone Encounter (Signed)
Pt has still not returned call.

## 2011-04-01 ENCOUNTER — Encounter: Payer: Self-pay | Admitting: Internal Medicine

## 2011-04-05 ENCOUNTER — Ambulatory Visit (INDEPENDENT_AMBULATORY_CARE_PROVIDER_SITE_OTHER): Payer: Self-pay | Admitting: Internal Medicine

## 2011-04-05 ENCOUNTER — Encounter: Payer: Self-pay | Admitting: Internal Medicine

## 2011-04-05 VITALS — BP 140/80 | HR 78 | Ht 66.25 in | Wt 211.0 lb

## 2011-04-05 DIAGNOSIS — E669 Obesity, unspecified: Secondary | ICD-10-CM

## 2011-04-05 DIAGNOSIS — M5430 Sciatica, unspecified side: Secondary | ICD-10-CM

## 2011-04-05 DIAGNOSIS — M545 Low back pain, unspecified: Secondary | ICD-10-CM

## 2011-04-05 DIAGNOSIS — R0683 Snoring: Secondary | ICD-10-CM

## 2011-04-05 DIAGNOSIS — F341 Dysthymic disorder: Secondary | ICD-10-CM

## 2011-04-05 DIAGNOSIS — R0989 Other specified symptoms and signs involving the circulatory and respiratory systems: Secondary | ICD-10-CM

## 2011-04-05 DIAGNOSIS — R0609 Other forms of dyspnea: Secondary | ICD-10-CM

## 2011-04-05 DIAGNOSIS — I10 Essential (primary) hypertension: Secondary | ICD-10-CM

## 2011-04-05 MED ORDER — SOLIFENACIN SUCCINATE 10 MG PO TABS: 10.0000 mg | ORAL_TABLET | Freq: Every day | ORAL | Status: DC

## 2011-04-05 NOTE — Patient Instructions (Signed)
Consider weight watchers .   Record  All intake and beverages   For the next 3-4 weeks at least . Include sleep   Patterns  . And any extra exercise .  No change in meds at this point  Will have to review any records  available  .  3500 calories is the energy content of a pound of body weight .Must have a 3500 cal deficit to lose one pound . Thus decrease 500 calorie equivalent per day in food or drink intake / or exercise  for 7 days to lose one pound.  Consider check for sleep apnea  Possible.

## 2011-04-05 NOTE — Progress Notes (Signed)
Subjective:    Patient ID: Rachel Fields, female    DOB: 03-28-1956, 55 y.o.   MRN: [id number]  HPI Patient comes in as new patient to this provider  Has seen Dr Kirtland Bouchard in the past but has been cared for at [facility] over the last year .   She now has a job and insurance so needs PCP and comes in today .  preve records NA but by her hx.   She is concerned about her weight climbing and ways to control weight.As well as back pain. She has had issues with her back has to lift and use back at work. No surgery  Has had some issues in the past but not like this.  NO fever or fal or weakness .  Has tight sciatica and has seen chiro and pt with out resolution.. Is bothering her today at right back area . No bwoel or bladder dysfunction except oab ui.Has been given tramadol for pain.  Also has been on effexor for mood issues   Works 3 12 and 2 12 hour shifts .   Snores bad  NO stopped breathing.  Review of Systems ROS:  GEN/ HEENTNo fever, significant weight changes sweats headaches vision problems hearing changes, CV/ PULM; No chest pain shortness of breath cough, syncope,edema  change in exercise tolerance. GI /GU: No adominal pain, vomiting, change in bowel habits. No blood in the stool. Has some ui sx and has been on meds   Needs refill of vesicare. SKIN/HEME: ,no acute skin rashes suspicious lesions or bleeding. No lymphadenopathy, nodules, masses.  NEURO/ PSYCH:  No neurologic signs such as weakness numbness No depression anxiety. IMM/ Allergy: No unusual infections.   Allergic rhinitis on otc meds as needed. Marland Kitchen   REST of 12 system review negative Except as per HPI  Past Medical History  Diagnosis Date  . Allergic rhinitis   . Depression   . Low back pain   . Uterine fibroid    Past Surgical History  Procedure Date  . Cesarean section   . Tubal ligation   . Foot surgery   . Tumor removal     behind left eye    reports that she has never smoked. She does not have any smokeless tobacco  history on file. She reports that she drinks alcohol. She reports that she does not use illicit drugs. family history includes Bipolar disorder in her sisters; Dementia in her father; Diabetes in her father; Hypertension in her father; Lung cancer in her mother; Other in her mother; and Schizophrenia in her sisters. No Known Allergies     Objective:   Physical Exam Physical Exam: Vital signs reviewed WUJ:WJXB is a well-developed well-nourished alert cooperative  aa female who appears her stated age in no acute distress.  HEENT: normocephalic  traumatic , Eyes: PERRL EOM's full, conjunctiva clear, Nares: paten,t no deformity discharge or tenderness., Ears: no deformity EAC's clear TMs with normal landmarks. Mouth: clear OP, no lesions, edema.  Moist mucous membranes. Dentition in adequate repair. NECK: supple without masses, thyromegaly or bruits. CHEST/PULM:  Clear to auscultation and percussion breath sounds equal no wheeze , rales or rhonchi. No chest wall deformities or tenderness. CV: PMI is nondisplaced, S1 S2 no gallops, murmurs, rubs. Peripheral pulses are full without delay.No JVD .  ABDOMEN: Bowel sounds normal nontender  No guard or rebound, no hepato splenomegal no CVA tenderness.   Extremtities:  No clubbing cyanosis or edema, no acute joint swelling or redness no  focal atrophy NEURO:  Oriented x3, cranial nerves 3-12 appear to be intact, no obvious focal weakness,gait within normal limits no abnormal reflexes or asymmetrical SKIN: No acute rashes normal turgor, color, no bruising or petechiae. PSYCH: Oriented, good eye contact, no obvious depression anxiety, cognition and judgment appear normal. Tender at right si ls area  Nl le strength  Neg slr.       Assessment & Plan:  Weight   Obesity    Exogenous   By hx and back is problematic . Counseled. About strategies . And many  Oral meds ineffective or risk of se.   Back pain  Right sciatic sx  Mechanical  without alarm features   But  Quite  Prolonged . No records available but has had some interventions. OAB  Ok to refill meds  Depression  On meds     HT  Controlled  Allergic rhinitis   > 50% spent counseling and coordinating care   Total visit 45 minutes

## 2011-04-18 ENCOUNTER — Encounter: Payer: Self-pay | Admitting: Internal Medicine

## 2011-04-26 ENCOUNTER — Other Ambulatory Visit (INDEPENDENT_AMBULATORY_CARE_PROVIDER_SITE_OTHER): Payer: Self-pay

## 2011-04-26 DIAGNOSIS — Z0000 Encounter for general adult medical examination without abnormal findings: Secondary | ICD-10-CM

## 2011-04-26 LAB — CBC WITH DIFFERENTIAL/PLATELET
Basophils Absolute: 0 10*3/uL (ref 0.0–0.1)
Basophils Relative: 0.5 % (ref 0.0–3.0)
Eosinophils Absolute: 0.2 10*3/uL (ref 0.0–0.7)
Eosinophils Relative: 2.7 % (ref 0.0–5.0)
HCT: 37.6 % (ref 36.0–46.0)
Hemoglobin: 12.3 g/dL (ref 12.0–15.0)
Lymphocytes Relative: 34.7 % (ref 12.0–46.0)
Lymphs Abs: 1.9 10*3/uL (ref 0.7–4.0)
MCHC: 32.8 g/dL (ref 30.0–36.0)
MCV: 81.6 fl (ref 78.0–100.0)
Monocytes Absolute: 0.4 10*3/uL (ref 0.1–1.0)
Monocytes Relative: 7.5 % (ref 3.0–12.0)
Neutro Abs: 3 10*3/uL (ref 1.4–7.7)
Neutrophils Relative %: 54.6 % (ref 43.0–77.0)
Platelets: 304 10*3/uL (ref 150.0–400.0)
RBC: 4.61 Mil/uL (ref 3.87–5.11)
RDW: 15.4 % — ABNORMAL HIGH (ref 11.5–14.6)
WBC: 5.6 10*3/uL (ref 4.5–10.5)

## 2011-04-26 LAB — BASIC METABOLIC PANEL
BUN: 12 mg/dL (ref 6–23)
CO2: 27 mEq/L (ref 19–32)
Calcium: 8.6 mg/dL (ref 8.4–10.5)
Chloride: 101 mEq/L (ref 96–112)
Creatinine, Ser: 1 mg/dL (ref 0.4–1.2)
GFR: 71.55 mL/min (ref >60.00)
Glucose, Bld: 99 mg/dL (ref 70–99)
Potassium: 3.7 mEq/L (ref 3.5–5.1)
Sodium: 136 mEq/L (ref 135–145)

## 2011-04-26 LAB — LIPID PANEL
Cholesterol: 200 mg/dL (ref 0–200)
HDL: 57.9 mg/dL (ref >39.00)
LDL Cholesterol: 122 mg/dL — ABNORMAL HIGH (ref 0–99)
Total CHOL/HDL Ratio: 3
Triglycerides: 100 mg/dL (ref 0.0–149.0)
VLDL: 20 mg/dL (ref 0.0–40.0)

## 2011-04-26 LAB — POCT URINALYSIS DIPSTICK
Bilirubin, UA: NEGATIVE
Blood, UA: NEGATIVE
Glucose, UA: NEGATIVE
Ketones, UA: NEGATIVE
Leukocytes, UA: NEGATIVE
Nitrite, UA: NEGATIVE
Protein, UA: NEGATIVE
Spec Grav, UA: 1.02
Urobilinogen, UA: 0.2
pH, UA: 6.5

## 2011-04-26 LAB — HEPATIC FUNCTION PANEL
ALT: 48 U/L — ABNORMAL HIGH (ref 0–35)
AST: 37 U/L (ref 0–37)
Albumin: 4.2 g/dL (ref 3.5–5.2)
Alkaline Phosphatase: 100 U/L (ref 39–117)
Bilirubin, Direct: 0 mg/dL (ref 0.0–0.3)
Total Bilirubin: 0.4 mg/dL (ref 0.3–1.2)
Total Protein: 7.2 g/dL (ref 6.0–8.3)

## 2011-04-26 LAB — TSH: TSH: 1.5 u[IU]/mL (ref 0.35–5.50)

## 2011-05-03 ENCOUNTER — Encounter: Payer: Self-pay | Admitting: Internal Medicine

## 2011-05-03 DIAGNOSIS — Z0289 Encounter for other administrative examinations: Secondary | ICD-10-CM

## 2012-04-12 ENCOUNTER — Emergency Department (HOSPITAL_COMMUNITY): Payer: Self-pay

## 2012-04-12 ENCOUNTER — Encounter (HOSPITAL_COMMUNITY): Payer: Self-pay | Admitting: *Deleted

## 2012-04-12 ENCOUNTER — Emergency Department (HOSPITAL_COMMUNITY)
Admission: EM | Admit: 2012-04-12 | Discharge: 2012-04-12 | Disposition: A | Discharge status: Home / Self Care | Payer: Self-pay | Attending: Emergency Medicine | Admitting: Emergency Medicine

## 2012-04-12 DIAGNOSIS — Z801 Family history of malignant neoplasm of trachea, bronchus and lung: Secondary | ICD-10-CM | POA: Insufficient documentation

## 2012-04-12 DIAGNOSIS — F3289 Other specified depressive episodes: Secondary | ICD-10-CM | POA: Insufficient documentation

## 2012-04-12 DIAGNOSIS — R0789 Other chest pain: Secondary | ICD-10-CM

## 2012-04-12 DIAGNOSIS — M545 Low back pain, unspecified: Secondary | ICD-10-CM | POA: Insufficient documentation

## 2012-04-12 DIAGNOSIS — R079 Chest pain, unspecified: Secondary | ICD-10-CM | POA: Insufficient documentation

## 2012-04-12 DIAGNOSIS — Z8249 Family history of ischemic heart disease and other diseases of the circulatory system: Secondary | ICD-10-CM | POA: Insufficient documentation

## 2012-04-12 DIAGNOSIS — F329 Major depressive disorder, single episode, unspecified: Secondary | ICD-10-CM | POA: Insufficient documentation

## 2012-04-12 DIAGNOSIS — J309 Allergic rhinitis, unspecified: Secondary | ICD-10-CM | POA: Insufficient documentation

## 2012-04-12 DIAGNOSIS — I10 Essential (primary) hypertension: Secondary | ICD-10-CM | POA: Insufficient documentation

## 2012-04-12 DIAGNOSIS — Z818 Family history of other mental and behavioral disorders: Secondary | ICD-10-CM | POA: Insufficient documentation

## 2012-04-12 DIAGNOSIS — Z833 Family history of diabetes mellitus: Secondary | ICD-10-CM | POA: Insufficient documentation

## 2012-04-12 HISTORY — DX: Essential (primary) hypertension: I10

## 2012-04-12 LAB — POCT I-STAT, CHEM 8
BUN: 11 mg/dL (ref 6–23)
Calcium, Ion: 1.26 mmol/L (ref 1.12–1.32)
Chloride: 104 mEq/L (ref 96–112)
Creatinine, Ser: 1 mg/dL (ref 0.50–1.10)
Glucose, Bld: 117 mg/dL — ABNORMAL HIGH (ref 70–99)
HCT: 39 % (ref 36.0–46.0)
Hemoglobin: 13.3 g/dL (ref 12.0–15.0)
Potassium: 3.6 mEq/L (ref 3.5–5.1)
Sodium: 141 mEq/L (ref 135–145)
TCO2: 26 mmol/L (ref 0–100)

## 2012-04-12 LAB — CBC
HCT: 36.7 % (ref 36.0–46.0)
Hemoglobin: 12.3 g/dL (ref 12.0–15.0)
MCH: 26 pg (ref 26.0–34.0)
MCHC: 33.5 g/dL (ref 30.0–36.0)
MCV: 77.6 fL — ABNORMAL LOW (ref 78.0–100.0)
Platelets: 228 10*3/uL (ref 150–400)
RBC: 4.73 MIL/uL (ref 3.87–5.11)
RDW: 14.1 % (ref 11.5–15.5)
WBC: 7.9 10*3/uL (ref 4.0–10.5)

## 2012-04-12 LAB — BASIC METABOLIC PANEL
BUN: 12 mg/dL (ref 6–23)
CO2: 28 mEq/L (ref 19–32)
Calcium: 9.7 mg/dL (ref 8.4–10.5)
Chloride: 102 mEq/L (ref 96–112)
Creatinine, Ser: 0.91 mg/dL (ref 0.50–1.10)
GFR calc Af Amer: 81 mL/min — ABNORMAL LOW (ref >90)
GFR calc non Af Amer: 70 mL/min — ABNORMAL LOW (ref >90)
Glucose, Bld: 119 mg/dL — ABNORMAL HIGH (ref 70–99)
Potassium: 3.5 mEq/L (ref 3.5–5.1)
Sodium: 140 mEq/L (ref 135–145)

## 2012-04-12 LAB — POCT I-STAT TROPONIN I
Troponin i, poc: 0 ng/mL (ref 0.00–0.08)
Troponin i, poc: 0 ng/mL (ref 0.00–0.08)

## 2012-04-12 MED ORDER — TRAMADOL HCL 50 MG PO TABS: 50.0000 mg | ORAL_TABLET | Freq: Four times a day (QID) | ORAL | Status: AC | PRN

## 2012-04-12 MED ORDER — TRAMADOL HCL 50 MG PO TABS
ORAL_TABLET | ORAL | Status: AC
  Filled 2012-04-12: qty 1

## 2012-04-12 NOTE — ED Notes (Signed)
Patient reports onset of chest pain x 45 min.  Sharp pressure that was sudden onset while shopping at [employer].  She had also done a workout this morning. Patient states her pain is non radiating.  Patient denies n/v.  No diaphoresis.  No sob.  Patient given aspirin 324mg  and 1 nitro sl.  Patient with nsr on ekg per ems.  Patient has iv to left hand.

## 2012-04-12 NOTE — ED Provider Notes (Addendum)
History     CSN: [id number]  Arrival date & time 04/12/12  0450   First MD Initiated Contact with Patient 04/12/12 [phone]      Chief Complaint  Patient presents with  . Chest Pain    (Consider location/radiation/quality/duration/timing/severity/associated sxs/prior treatment) HPI Complains of left-sided chest pain described as sharp at left parasternal area nonradiating worse with changing positions onset 45 minutes prior to coming here. Pain was onset 15 minutes after completing her exercise program at a gym. Reports doing work with weights and using an exercise bicycle, with no chest pain. Pain feels like a muscle pull . She denies shortness of breath and denies sweatiness with pain, though she was nauseated for a few minutes. She was treated by EMS with aspirin and one sublingual nitroglycerin. Pain began to subside 20 minutes after nitroglycerin was administered. Discomfort is minimal at present. No other associated symptoms Past Medical History  Diagnosis Date  . Allergic rhinitis   . Depression   . Low back pain   . Uterine fibroid   . Hypertension     Past Surgical History  Procedure Date  . Cesarean section   . Tubal ligation   . Foot surgery   . Tumor removal     behind left eye    Family History  Problem Relation Age of Onset  . Diabetes Father   . Hypertension Father   . Dementia Father     senile  . Bipolar disorder Sister   . Schizophrenia Sister   . Schizophrenia Sister   . Bipolar disorder Sister   . Schizophrenia Sister   . Bipolar disorder Sister   . Other Mother     smoker  . Lung cancer Mother     2010    History  Substance Use Topics  . Smoking status: Never Smoker   . Smokeless tobacco: Not on file  . Alcohol Use: Yes     socially    OB History    Grav Para Term Preterm Abortions TAB SAB Ect Mult Living                  Review of Systems  Constitutional: Negative.   HENT: Negative.   Respiratory: Positive for chest tightness.     Cardiovascular: Negative.   Gastrointestinal: Positive for nausea.  Musculoskeletal: Negative.   Skin: Negative.   Neurological: Negative.   Hematological: Negative.   Psychiatric/Behavioral: Negative.   All other systems reviewed and are negative.    Allergies  Review of patient's allergies indicates no known allergies.  Home Medications   Current Outpatient Rx  Name Route Sig Dispense Refill  . HEMOCYTE PLUS 106-1 MG PO CAPS Oral Take 1 capsule by mouth daily.     Marland Kitchen CALCIUM + D PO Oral Take 2 tablets by mouth daily.    Marland Kitchen CLONIDINE HCL 0.1 MG PO TABS Oral Take 0.1 mg by mouth at bedtime.    . CYCLOBENZAPRINE HCL 10 MG PO TABS Oral Take 10 mg by mouth 3 (three) times daily as needed. spasms    . LISINOPRIL 5 MG PO TABS Oral Take 5 mg by mouth daily.    Marland Kitchen LORATADINE 10 MG PO TABS Oral Take 10 mg by mouth daily. Allergies    . FISH OIL 1000 MG PO CAPS Oral Take 3 capsules by mouth daily.    . TOLTERODINE TARTRATE ER 4 MG PO CP24 Oral Take 4 mg by mouth daily.      . TRAMADOL HCL  50 MG PO TABS Oral Take 50 mg by mouth every 6 (six) hours as needed.     . VENLAFAXINE HCL ER 75 MG PO CP24 Oral Take 75 mg by mouth daily. 3 caps daily     . OVER THE COUNTER MEDICATION  2 tablets. Biotin      BP 157/79  Pulse 79  Temp 97.5 F (36.4 C) (Oral)  Ht 5\' 7"  (1.702 m)  Wt 210 lb (95.255 kg)  BMI 32.89 kg/m2  SpO2 100%  Physical Exam  Nursing note and vitals reviewed. Constitutional: She appears well-developed and well-nourished.  HENT:  Head: Normocephalic and atraumatic.  Eyes: Conjunctivae are normal. Pupils are equal, round, and reactive to light.  Neck: Neck supple. No tracheal deviation present. No thyromegaly present.  Cardiovascular: Normal rate and regular rhythm.   No murmur heard. Pulmonary/Chest: Effort normal and breath sounds normal. She exhibits tenderness.       Left-sided parasternal tenderness, reproducing pain exactly  Abdominal: Soft. Bowel sounds are  normal. She exhibits no distension. There is no tenderness.  Musculoskeletal: Normal range of motion. She exhibits no edema and no tenderness.  Neurological: She is alert. Coordination normal.  Skin: Skin is warm and dry. No rash noted.  Psychiatric: She has a normal mood and affect.    ED Course  Procedures (including critical care time)  Labs Reviewed  CBC - Abnormal; Notable for the following:    MCV 77.6 (*)     All other components within normal limits  POCT I-STAT TROPONIN I  BASIC METABOLIC PANEL   Date: 04/12/2012  Rate: 75  Rhythm: normal sinus rhythm  QRS Axis: normal  Intervals: normal  ST/T Wave abnormalities: normal  Conduction Disutrbances: none  Narrative Interpretation: unremarkable Unchanged from 11/23/2010 interpreted by me Results for orders placed during the hospital encounter of 04/12/12  CBC      Component Value Range   WBC 7.9  4.0 - 10.5 K/uL   RBC 4.73  3.87 - 5.11 MIL/uL   Hemoglobin 12.3  12.0 - 15.0 g/dL   HCT 40.9  81.1 - 91.4 %   MCV 77.6 (*) 78.0 - 100.0 fL   MCH 26.0  26.0 - 34.0 pg   MCHC 33.5  30.0 - 36.0 g/dL   RDW 78.2  95.6 - 21.3 %   Platelets 228  150 - 400 K/uL  BASIC METABOLIC PANEL      Component Value Range   Sodium 140  135 - 145 mEq/L   Potassium 3.5  3.5 - 5.1 mEq/L   Chloride 102  96 - 112 mEq/L   CO2 28  19 - 32 mEq/L   Glucose, Bld 119 (*) 70 - 99 mg/dL   BUN 12  6 - 23 mg/dL   Creatinine, Ser 0.86  0.50 - 1.10 mg/dL   Calcium 9.7  8.4 - 57.8 mg/dL   GFR calc non Af Amer 70 (*) >90 mL/min   GFR calc Af Amer 81 (*) >90 mL/min  POCT I-STAT TROPONIN I      Component Value Range   Troponin i, poc 0.00  0.00 - 0.08 ng/mL   Comment 3           POCT I-STAT, CHEM 8      Component Value Range   Sodium 141  135 - 145 mEq/L   Potassium 3.6  3.5 - 5.1 mEq/L   Chloride 104  96 - 112 mEq/L   BUN 11  6 -  23 mg/dL   Creatinine, Ser 4.09  0.50 - 1.10 mg/dL   Glucose, Bld 811 (*) 70 - 99 mg/dL   Calcium, Ion 9.14  7.82 - 1.32  mmol/L   TCO2 26  0 - 100 mmol/L   Hemoglobin 13.3  12.0 - 15.0 g/dL   HCT 95.6  21.3 - 08.6 %   Dg Chest 2 View  04/12/2012  *RADIOLOGY REPORT*  Clinical Data: Chest pain  CHEST - 2 VIEW  Comparison: 11/23/2010  Findings: Lungs are clear.  The previously questioned nodule is not identified on today's examination.  No pleural effusion or pneumothorax. The cardiomediastinal contours are within normal limits. The visualized bones and soft tissues are without significant appreciable abnormality.  IMPRESSION: No radiographic evidence of acute cardiopulmonary process.  Original Report Authenticated By: Waneta Martins, M.D.     No results found.   No diagnosis found.  Chest x-ray reviewed by me  MDM  Very low pretest probability for acute coronary syndrome. Symptoms highly atypical onset after exercising and pain is easily reproducible normal EKG. pain most likely musculoskeletal in etiology Patient signed out to [facility] at 7:45 AM we'll check further lab work and reevaluate patient Diagnosis atypical chest pain        Doug Sou, MD 04/12/12 [id number]  Doug Sou, MD 04/12/12 6962  Repeat troponin is normal. The patient will be discharged and advised to use over-the-counter ibuprofen as needed. Prescription is given for tramadol for times pain might be more severe.  Results for orders placed during the hospital encounter of 04/12/12  CBC      Component Value Range   WBC 7.9  4.0 - 10.5 K/uL   RBC 4.73  3.87 - 5.11 MIL/uL   Hemoglobin 12.3  12.0 - 15.0 g/dL   HCT 95.2  84.1 - 32.4 %   MCV 77.6 (*) 78.0 - 100.0 fL   MCH 26.0  26.0 - 34.0 pg   MCHC 33.5  30.0 - 36.0 g/dL   RDW 40.1  02.7 - 25.3 %   Platelets 228  150 - 400 K/uL  BASIC METABOLIC PANEL      Component Value Range   Sodium 140  135 - 145 mEq/L   Potassium 3.5  3.5 - 5.1 mEq/L   Chloride 102  96 - 112 mEq/L   CO2 28  19 - 32 mEq/L   Glucose, Bld 119 (*) 70 - 99 mg/dL   BUN 12  6 - 23 mg/dL    Creatinine, Ser 6.64  0.50 - 1.10 mg/dL   Calcium 9.7  8.4 - 40.3 mg/dL   GFR calc non Af Amer 70 (*) >90 mL/min   GFR calc Af Amer 81 (*) >90 mL/min  POCT I-STAT TROPONIN I      Component Value Range   Troponin i, poc 0.00  0.00 - 0.08 ng/mL   Comment 3           POCT I-STAT, CHEM 8      Component Value Range   Sodium 141  135 - 145 mEq/L   Potassium 3.6  3.5 - 5.1 mEq/L   Chloride 104  96 - 112 mEq/L   BUN 11  6 - 23 mg/dL   Creatinine, Ser 4.74  0.50 - 1.10 mg/dL   Glucose, Bld 259 (*) 70 - 99 mg/dL   Calcium, Ion 5.63  8.75 - 1.32 mmol/L   TCO2 26  0 - 100 mmol/L   Hemoglobin 13.3  12.0 -  15.0 g/dL   HCT 40.9  81.1 - 91.4 %  POCT I-STAT TROPONIN I      Component Value Range   Troponin i, poc 0.00  0.00 - 0.08 ng/mL   Comment 3            Dg Chest 2 View  04/12/2012  *RADIOLOGY REPORT*  Clinical Data: Chest pain  CHEST - 2 VIEW  Comparison: 11/23/2010  Findings: Lungs are clear.  The previously questioned nodule is not identified on today's examination.  No pleural effusion or pneumothorax. The cardiomediastinal contours are within normal limits. The visualized bones and soft tissues are without significant appreciable abnormality.  IMPRESSION: No radiographic evidence of acute cardiopulmonary process.  Original Report Authenticated By: Waneta Martins, M.D.      Dione Booze, MD 04/12/12 [phone]

## 2012-04-12 NOTE — ED Notes (Signed)
Patient reports she had onset of chest pain while in the grocery.  Patient with no diaphoresis.  No sob.  Patient had performed her exercise routine w/o diff today.    Patient was grocery shopping when she had onset of pain.  No noted edema.  No sob at rest.  Patient noted to get anxious.

## 2012-04-12 NOTE — ED Notes (Signed)
Patient denies any complaints at this time.  Patient resting.  Remains on cardiac monitoring

## 2013-05-15 ENCOUNTER — Telehealth: Payer: Self-pay | Admitting: Internal Medicine

## 2013-05-15 NOTE — Telephone Encounter (Signed)
appt made/kh 

## 2013-05-15 NOTE — Telephone Encounter (Signed)
Pt would like to switch to [employer] from Dr Fabian Sharp. Is that OK w/ you Dr Fabian Sharp? Is that of w/ you Ms Orvan Falconer?

## 2013-05-15 NOTE — Telephone Encounter (Signed)
Ok  With me   ( Have only seen her once 2 years ago.)

## 2013-05-15 NOTE — Telephone Encounter (Signed)
Ok with me 

## 2013-05-30 ENCOUNTER — Ambulatory Visit: Payer: Self-pay | Admitting: Family

## 2013-05-30 DIAGNOSIS — Z0289 Encounter for other administrative examinations: Secondary | ICD-10-CM

## 2013-05-30 NOTE — Telephone Encounter (Signed)
Patient did not show for her new appt with Padonda. Per Oran Rein, do not schedule patient with her again. At this point, Dr Fabian Sharp would still be PCP unless Dr Fabian Sharp chooses not to be. Please advise.

## 2013-05-30 NOTE — Telephone Encounter (Signed)
Do not schedule  With me  But can take a message about her needs . Do not put me as PCP at this time  as she requested to change.

## 2013-05-31 NOTE — Telephone Encounter (Signed)
Noted. Did not change PCP.

## 2014-02-14 ENCOUNTER — Encounter: Payer: Self-pay | Admitting: Internal Medicine

## 2014-02-14 ENCOUNTER — Ambulatory Visit: Payer: Self-pay | Attending: Internal Medicine | Admitting: Internal Medicine

## 2014-02-14 ENCOUNTER — Ambulatory Visit (HOSPITAL_COMMUNITY)
Admission: RE | Admit: 2014-02-14 | Discharge: 2014-02-14 | Disposition: A | Discharge status: Home / Self Care | Payer: Self-pay | Source: Ambulatory Visit | Attending: Internal Medicine | Admitting: Internal Medicine

## 2014-02-14 VITALS — BP 122/77 | HR 82 | Temp 98.3°F | Resp 16 | Ht 67.0 in | Wt 210.0 lb

## 2014-02-14 DIAGNOSIS — Z1211 Encounter for screening for malignant neoplasm of colon: Secondary | ICD-10-CM

## 2014-02-14 DIAGNOSIS — M545 Low back pain, unspecified: Secondary | ICD-10-CM | POA: Insufficient documentation

## 2014-02-14 DIAGNOSIS — I10 Essential (primary) hypertension: Secondary | ICD-10-CM | POA: Insufficient documentation

## 2014-02-14 DIAGNOSIS — M79672 Pain in left foot: Secondary | ICD-10-CM | POA: Insufficient documentation

## 2014-02-14 DIAGNOSIS — F329 Major depressive disorder, single episode, unspecified: Secondary | ICD-10-CM | POA: Insufficient documentation

## 2014-02-14 DIAGNOSIS — M79609 Pain in unspecified limb: Secondary | ICD-10-CM | POA: Insufficient documentation

## 2014-02-14 DIAGNOSIS — E785 Hyperlipidemia, unspecified: Secondary | ICD-10-CM | POA: Insufficient documentation

## 2014-02-14 DIAGNOSIS — J309 Allergic rhinitis, unspecified: Secondary | ICD-10-CM | POA: Insufficient documentation

## 2014-02-14 DIAGNOSIS — M722 Plantar fascial fibromatosis: Secondary | ICD-10-CM | POA: Insufficient documentation

## 2014-02-14 DIAGNOSIS — F3289 Other specified depressive episodes: Secondary | ICD-10-CM | POA: Insufficient documentation

## 2014-02-14 DIAGNOSIS — D259 Leiomyoma of uterus, unspecified: Secondary | ICD-10-CM | POA: Insufficient documentation

## 2014-02-14 DIAGNOSIS — E1169 Type 2 diabetes mellitus with other specified complication: Secondary | ICD-10-CM | POA: Insufficient documentation

## 2014-02-14 LAB — POCT GLYCOSYLATED HEMOGLOBIN (HGB A1C): Hemoglobin A1C: 5.7

## 2014-02-14 MED ORDER — VENLAFAXINE HCL ER 75 MG PO CP24: 75.0000 mg | ORAL_CAPSULE | Freq: Every day | ORAL | Status: DC

## 2014-02-14 MED ORDER — LORATADINE 10 MG PO TABS: 10.0000 mg | ORAL_TABLET | Freq: Every day | ORAL | Status: DC

## 2014-02-14 MED ORDER — TOLTERODINE TARTRATE ER 4 MG PO CP24: 4.0000 mg | ORAL_CAPSULE | Freq: Every day | ORAL | Status: DC

## 2014-02-14 MED ORDER — CYCLOBENZAPRINE HCL 10 MG PO TABS: 10.0000 mg | ORAL_TABLET | Freq: Three times a day (TID) | ORAL | Status: DC | PRN

## 2014-02-14 MED ORDER — TRAMADOL HCL 50 MG PO TABS: 50.0000 mg | ORAL_TABLET | Freq: Four times a day (QID) | ORAL | Status: DC | PRN

## 2014-02-14 MED ORDER — CALCIUM CARBONATE-VITAMIN D 250-125 MG-UNIT PO TABS: 1.0000 | ORAL_TABLET | Freq: Every day | ORAL | Status: AC

## 2014-02-14 MED ORDER — FISH OIL 1000 MG PO CAPS: 3.0000 | ORAL_CAPSULE | Freq: Every day | ORAL | Status: DC

## 2014-02-14 MED ORDER — LISINOPRIL 5 MG PO TABS: 5.0000 mg | ORAL_TABLET | Freq: Every day | ORAL | Status: DC

## 2014-02-14 MED ORDER — HEMOCYTE PLUS 106-1 MG PO CAPS: 1.0000 | ORAL_CAPSULE | Freq: Every day | ORAL | Status: AC

## 2014-02-14 MED ORDER — HEMOCYTE PLUS 106-1 MG PO CAPS: 1.0000 | ORAL_CAPSULE | Freq: Every day | ORAL | Status: DC

## 2014-02-14 MED ORDER — CALCIUM CARBONATE-VITAMIN D 250-125 MG-UNIT PO TABS: 1.0000 | ORAL_TABLET | Freq: Every day | ORAL | Status: DC

## 2014-02-14 NOTE — Addendum Note (Signed)
Addended by: Candie Chroman D on: 02/14/2014 04:50 PM   Modules accepted: Orders

## 2014-02-14 NOTE — Addendum Note (Signed)
Addended by: Candie Chroman D on: 02/14/2014 04:37 PM   Modules accepted: Orders, Medications

## 2014-02-14 NOTE — Progress Notes (Signed)
Pt here to establish care for hypertension,left foot pain and sinus problems' Pt c/o left heel pain x 3 weeks radiating left calf with worsening  Pt need Pap Smear/Mammogram and Colonoscopy

## 2014-02-14 NOTE — Progress Notes (Signed)
Patient ID: Rachel Fields, female   DOB: [birth year], 58 y.o.   MRN: [id number]  CC: New patient  HPI: 58 year old female with past medical history of hypertension, depression, plantar fasciitis who presents to clinic to establish primary care physician. She needs refills on medications and also complains of left heel pain and inability to walk without experiencing pain. She has a history of plantar fasciitis but it subsequently resolved on its own many years ago. No reports of falls. No dizziness or lightheadedness. No chest pain or palpitations.  No Known Allergies Past Medical History  Diagnosis Date  . Allergic rhinitis   . Depression   . Low back pain   . Uterine fibroid   . Hypertension    Current Outpatient Prescriptions on File Prior to Visit  Medication Sig Dispense Refill  . [DISCONTINUED] Calcium Carbonate-Vitamin D (CALCIUM + D PO) Take 2 tablets by mouth daily.      . [DISCONTINUED] tolterodine (DETROL LA) 4 MG 24 hr capsule Take 4 mg by mouth daily.        Marland Kitchen OVER THE COUNTER MEDICATION 2 tablets. Biotin 505mcg       No current facility-administered medications on file prior to visit.   Family History  Problem Relation Age of Onset  . Diabetes Father   . Hypertension Father   . Dementia Father     senile  . Bipolar disorder Sister   . Schizophrenia Sister   . Schizophrenia Sister   . Bipolar disorder Sister   . Schizophrenia Sister   . Bipolar disorder Sister   . Other Mother     smoker  . Lung cancer Mother     2010   History   Social History  . Marital Status: Single    Spouse Name: N/A    Number of Children: N/A  . Years of Education: N/A   Occupational History  . Not on file.   Social History Main Topics  . Smoking status: Never Smoker   . Smokeless tobacco: Not on file  . Alcohol Use: Yes     Comment: socially  . Drug Use: No  . Sexual Activity:    Other Topics Concern  . Not on file   Social History Narrative   Single   Moved from [place].     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   [place]  Of 2  55 yo son.   Pet dogs                 Review of Systems  Constitutional: Negative for fever, chills, diaphoresis, activity change, appetite change and fatigue.  HENT: Negative for ear pain, nosebleeds, congestion, facial swelling, rhinorrhea, neck pain, neck stiffness and ear discharge.   Eyes: Negative for pain, discharge, redness, itching and visual disturbance.  Respiratory: Negative for cough, choking, chest tightness, shortness of breath, wheezing and stridor.   Cardiovascular: Negative for chest pain, palpitations and leg swelling.  Gastrointestinal: Negative for abdominal distention.  Genitourinary: Negative for dysuria, urgency, frequency, hematuria, flank pain, decreased urine volume, difficulty urinating and dyspareunia.  Musculoskeletal: Negative for back pain, joint swelling, arthralgias and gait problem. Left heel pain  Neurological: Negative for dizziness, tremors, seizures, syncope, facial asymmetry, speech difficulty, weakness, light-headedness, numbness and headaches.  Hematological: Negative for adenopathy. Does not bruise/bleed easily.  Psychiatric/Behavioral: Negative for hallucinations, behavioral problems, confusion, dysphoric mood, decreased concentration and agitation.    Objective:   Filed Vitals:   02/14/14 1600  BP: 122/77  Pulse: 82  Temp: 98.3 F (36.8 C)  Resp: 16    Physical Exam  Constitutional: Appears well-developed and well-nourished. No distress.  HENT: Normocephalic. External right and left ear normal. Oropharynx is clear and moist.  Eyes: Conjunctivae and EOM are normal. PERRLA, no scleral icterus.  Neck: Normal ROM. Neck supple. No JVD. No tracheal deviation. No thyromegaly.  CVS: RRR, S1/S2 +, no murmurs, no gallops, no carotid bruit.  Pulmonary: Effort and breath sounds normal, no stridor, rhonchi, wheezes, rales.  Abdominal: Soft. BS +,  no distension, tenderness, rebound or  guarding.  Musculoskeletal: Normal range of motion. No edema and no tenderness.  Lymphadenopathy: No lymphadenopathy noted, cervical, inguinal. Neuro: Alert. Normal reflexes, muscle tone coordination. No cranial nerve deficit. Skin: Skin is warm and dry. No rash noted. Not diaphoretic. No erythema. No pallor.  Psychiatric: Normal mood and affect. Behavior, judgment, thought content normal.   Lab Results  Component Value Date   WBC 7.9 04/12/2012   HGB 13.3 04/12/2012   HCT 39.0 04/12/2012   MCV 77.6* 04/12/2012   PLT 228 04/12/2012   Lab Results  Component Value Date   CREATININE 1.00 04/12/2012   BUN 11 04/12/2012   NA 141 04/12/2012   K 3.6 04/12/2012   CL 104 04/12/2012   CO2 28 04/12/2012    No results found for this basename: HGBA1C   Lipid Panel     Component Value Date/Time   CHOL 200 04/26/2011 0849   TRIG 100.0 04/26/2011 0849   HDL 57.90 04/26/2011 0849   CHOLHDL 3 04/26/2011 0849   VLDL 20.0 04/26/2011 0849   LDLCALC 122* 04/26/2011 0849       Assessment and plan:   Patient Active Problem List   Diagnosis Date Noted  . Dyslipidemia 02/14/2014    Priority: Medium - Check lipid panel  - Continue omega-3   . Pain of left heel 02/14/2014    Priority: Medium - X-ray of the foot ordered; possible plantar fasciitis or a bone spur   . HYPERTENSION, BENIGN 06/15/2007    Priority: Medium - Patient no longer takes clonidine. Continue Prinivil and Venlafaxine - We have discussed target BP range - I have advised pt to check BP regularly and to call us back if the numbers are higher than 140/90 - discussed the importance of compliance with medical therapy and diet

## 2014-02-15 LAB — TSH: TSH: 1.337 u[IU]/mL (ref 0.350–4.500)

## 2014-02-15 LAB — BASIC METABOLIC PANEL
BUN: 16 mg/dL (ref 6–23)
CO2: 32 mEq/L (ref 19–32)
Calcium: 8.6 mg/dL (ref 8.4–10.5)
Chloride: 104 mEq/L (ref 96–112)
Creat: 0.94 mg/dL (ref 0.50–1.10)
Glucose, Bld: 90 mg/dL (ref 70–99)
Potassium: 3.8 mEq/L (ref 3.5–5.3)
Sodium: 140 mEq/L (ref 135–145)

## 2014-02-15 LAB — LIPID PANEL
Cholesterol: 184 mg/dL (ref 0–200)
HDL: 45 mg/dL (ref >39)
LDL Cholesterol: 109 mg/dL — ABNORMAL HIGH (ref 0–99)
Total CHOL/HDL Ratio: 4.1 Ratio
Triglycerides: 149 mg/dL (ref <150)
VLDL: 30 mg/dL (ref 0–40)

## 2014-02-27 ENCOUNTER — Ambulatory Visit: Payer: Self-pay

## 2014-03-19 ENCOUNTER — Ambulatory Visit: Payer: Self-pay | Admitting: Internal Medicine

## 2014-03-20 ENCOUNTER — Encounter: Payer: Self-pay | Admitting: Internal Medicine

## 2014-04-29 ENCOUNTER — Ambulatory Visit: Payer: Self-pay

## 2015-01-21 ENCOUNTER — Ambulatory Visit (INDEPENDENT_AMBULATORY_CARE_PROVIDER_SITE_OTHER): Payer: Managed Care, Other (non HMO) | Admitting: Family Medicine

## 2015-01-21 ENCOUNTER — Encounter: Payer: Self-pay | Admitting: Family Medicine

## 2015-01-21 VITALS — BP 132/98 | HR 84 | Temp 97.9°F | Ht 67.0 in | Wt 206.3 lb

## 2015-01-21 DIAGNOSIS — I10 Essential (primary) hypertension: Secondary | ICD-10-CM

## 2015-01-21 DIAGNOSIS — D649 Anemia, unspecified: Secondary | ICD-10-CM | POA: Diagnosis not present

## 2015-01-21 DIAGNOSIS — E669 Obesity, unspecified: Secondary | ICD-10-CM

## 2015-01-21 DIAGNOSIS — M545 Low back pain: Secondary | ICD-10-CM | POA: Diagnosis not present

## 2015-01-21 DIAGNOSIS — F341 Dysthymic disorder: Secondary | ICD-10-CM

## 2015-01-21 DIAGNOSIS — N3281 Overactive bladder: Secondary | ICD-10-CM

## 2015-01-21 DIAGNOSIS — E785 Hyperlipidemia, unspecified: Secondary | ICD-10-CM

## 2015-01-21 DIAGNOSIS — Z1211 Encounter for screening for malignant neoplasm of colon: Secondary | ICD-10-CM

## 2015-01-21 DIAGNOSIS — J302 Other seasonal allergic rhinitis: Secondary | ICD-10-CM

## 2015-01-21 MED ORDER — VENLAFAXINE HCL ER 75 MG PO CP24: 75.0000 mg | ORAL_CAPSULE | Freq: Every day | ORAL | Status: DC

## 2015-01-21 MED ORDER — FLUTICASONE PROPIONATE 50 MCG/ACT NA SUSP: 1.0000 | Freq: Every day | NASAL | Status: DC

## 2015-01-21 MED ORDER — TOLTERODINE TARTRATE ER 4 MG PO CP24: 4.0000 mg | ORAL_CAPSULE | Freq: Every day | ORAL | Status: DC

## 2015-01-21 MED ORDER — LISINOPRIL 5 MG PO TABS: 5.0000 mg | ORAL_TABLET | Freq: Every day | ORAL | Status: DC

## 2015-01-21 NOTE — Patient Instructions (Addendum)
BEFORE YOU LEAVE: -physical with pap in 3 months - please come fasting (can drink water or black coffee) -low back exercises  Please call the number provided to schedule your mammogram  -We placed a referral for you as discussed for the colonoscopy. It usually takes about 1-2 weeks to process and schedule this referral. If you have not heard from [place] regarding this appointment in 2 weeks please contact our office.  -We placed a referral for you as discussed. It usually takes about 1-2 weeks to process and schedule this referral. If you have not heard from [place] regarding this appointment in 2 weeks please contact our office.  -For the back: -do the exercises at least 3-4 days per week for the rest of your life if possible -for flares in pain: heat, topical sports creams with capsaicin or menthol and tylenol 500-1000mg  up to 3 times daily or 1-2 aleve up to 2 times daily   We recommend the following healthy lifestyle measures: - eat a healthy diet consisting of lots of vegetables, fruits, beans, nuts, seeds, healthy meats such as white chicken and fish and whole grains.  - avoid fried foods, fast food, processed foods, sodas, red meet and other fattening foods.  - get a least 150 minutes of aerobic exercise per week.

## 2015-01-21 NOTE — Progress Notes (Signed)
Pre visit review using our clinic review tool, if applicable. No additional management support is needed unless otherwise documented below in the visit note. 

## 2015-01-21 NOTE — Progress Notes (Signed)
HPI:  Rachel Fields is here to establish care. She used to see Dr. Sharren Bridge but it has been over 3 years as she did not have insurance for some time.  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; in school for RN -denies: hx of SI, manic depression, hospitalization  Sickle Cell Trait/Reports hx of iron def anemia her whole life: -reports takes daily iron -reports hx heavy menstrual bleeding, but no postmenopausal and no bleeding -denies: palpitations, weakness, hx transfusion  Seasonal Allergies: -uses flonase nasal spray -seasonal nasal symptoms  Chronic back pain: -started many years ago -reports hx of DDD per her chiropractor -lumbar -chronic intermittent bilat low back pain -denies: weakness, numbness, bowel or bladder incontinence  Hx over active bladder -chronic after had 3 kids -on detrol in the past and really did great on this -wants refill on the dtrol  Never had colonoscopy and wants referral  ROS negative for unless reported above: fevers, unintentional weight loss, hearing or vision loss, chest pain, palpitations, struggling to breath, hemoptysis, melena, hematochezia, hematuria, falls, loc, si, thoughts of self harm  Past Medical History  Diagnosis Date  . HTN (hypertension)   . Anxiety and depression   . Low back pain     chronic, reports hx DDD treated by chiropractor  . Hx of iron deficiency anemia     reports [place] trait; reports on iron her whole life  . Seasonal allergies   . No blood products     Jehovah's Witness  . OAB (overactive bladder)   . Uterine fibroid     Past Surgical History  Procedure  Laterality Date  . Cesarean section    . Tubal ligation    . Foot surgery    . Tumor removal      behind left eye    Family History  Problem Relation Age of Onset  . Diabetes Father   . Hypertension Father   . Dementia Father     senile  . Bipolar disorder Sister   . Schizophrenia Sister   . Schizophrenia Sister   . Bipolar disorder Sister   . Schizophrenia Sister   . Bipolar disorder Sister   . Other Mother     smoker  . Lung cancer Mother     2010  . Bipolar disorder Daughter     History   Social History  . Marital Status: Single    Spouse Name: N/A  . Number of Children: N/A  . Years of Education: N/A   Social History Main Topics  . Smoking status: Never Smoker   . Smokeless tobacco: Not on file  . Alcohol Use: Yes     Comment: socially - rare; 2 drinks a few times per month  . Drug Use: No  . Sexual Activity: Not on file   Other Topics Concern  . None   Social History Narrative   Work or School: CNA, working on [employer]      Home Situation: Lives with husband - married in 2015; hx of homelessness in 2015      Spiritual Beliefs: [place]  Lifestyle: no regular CV exercise; diet improving - reports she has been able to loose weight                       Current outpatient prescriptions:  .  calcium-vitamin D (CALCIUM + D) 250-125 MG-UNIT per tablet, Take 1 tablet by mouth daily., Disp: 30 tablet, Rfl: 3 .  Fe Fum-FA-B Cmp-C-Zn-Mg-Mn-Cu (HEMOCYTE PLUS) 106-1 MG CAPS, Take 1 capsule by mouth daily., Disp: 30 each, Rfl: 3 .  fluticasone (FLONASE) 50 MCG/ACT nasal spray, Place 1 spray into both nostrils at bedtime., Disp: 16 g, Rfl: 6 .  lisinopril (PRINIVIL,ZESTRIL) 5 MG tablet, Take 1 tablet (5 mg total) by mouth daily., Disp: 90 tablet, Rfl: 3 .  naproxen (NAPROSYN) 500 MG tablet, Take 500 mg by mouth daily., Disp: , Rfl:  .  OVER THE COUNTER MEDICATION, 2 tablets. Biotin 5047mcg, Disp: , Rfl:  .  tolterodine (DETROL LA) 4 MG 24 hr  capsule, Take 1 capsule (4 mg total) by mouth daily., Disp: 90 capsule, Rfl: 3 .  venlafaxine XR (EFFEXOR-XR) 75 MG 24 hr capsule, Take 1 capsule (75 mg total) by mouth daily with breakfast., Disp: 90 capsule, Rfl: 3 .  [DISCONTINUED] Calcium Carbonate-Vitamin D (CALCIUM + D PO), Take 2 tablets by mouth daily., Disp: , Rfl:  .  [DISCONTINUED] tolterodine (DETROL LA) 4 MG 24 hr capsule, Take 4 mg by mouth daily.  , Disp: , Rfl:   EXAM:  Filed Vitals:   01/21/15 1616  BP: 132/98  Pulse: 84  Temp: 97.9 F (36.6 C)    Body mass index is 32.3 kg/(m^2).  GENERAL: vitals reviewed and listed above, alert, oriented, appears well hydrated and in no acute distress  HEENT: atraumatic, conjunttiva clear, no obvious abnormalities on inspection of external nose and ears  NECK: no obvious masses on inspection  LUNGS: clear to auscultation bilaterally, no wheezes, rales or rhonchi, good air movement  CV: HRRR, no peripheral edema  Normal Gait Normal inspection of back, no obvious scoliosis or leg length descrepancy No bony TTP Soft tissue TTP at: R lower lumbar paraspinal muscle ant R PSIS -/+ tests: neg trendelenburg,-facet loading, -SLRT, -CLRT, -FABER, -FADIR Normal muscle strength, sensation to light touch and DTRs in LEs bilaterally  PSYCH: pleasant and cooperative, no obvious depression or anxiety  ASSESSMENT AND PLAN:  Discussed the following assessment and plan:  DEPRESSION/ANXIETY -refilled effexor  HYPERTENSION, BENIGN -recheck back on medication at follow up  Low back pain without sciatica, unspecified back pain laterality -no neurodeficits -HEP, conservative tx, advised against opiods for this type of pain  Anemia, unspecified anemia type -check CBC at physical, if normal may try trial off iron  Dyslipidemia Obesity -check labs at physical  Seasonal allergies  OAB (overactive bladder) -restart detrol  Colon cancer screening - Plan: Ambulatory referral to  Gastroenterology  -We reviewed the PMH, PSH, FH, SH, Meds and Allergies. -We provided refills for any medications we will prescribe as needed. -We addressed current concerns per orders and patient instructions. -We have asked for records for pertinent exams, studies, vaccines and notes from previous providers. -We have advised patient to follow up per instructions below. -needs CPE/pap/labs - she reorts is going to schedule this as she leaves  -Patient advised to return or notify a doctor immediately if symptoms worsen or persist or new concerns arise.  Patient Instructions  BEFORE YOU LEAVE: -physical with pap in 3 months - please come fasting (can drink water  or black coffee) -low back exercises  Please call the number provided to schedule your mammogram  -We placed a referral for you as discussed for the colonoscopy. It usually takes about 1-2 weeks to process and schedule this referral. If you have not heard from [place] regarding this appointment in 2 weeks please contact our office.  -We placed a referral for you as discussed. It usually takes about 1-2 weeks to process and schedule this referral. If you have not heard from [place] regarding this appointment in 2 weeks please contact our office.  -For the back: -do the exercises at least 3-4 days per week for the rest of your life if possible -for flares in pain: heat, topical sports creams with capsaicin or menthol and tylenol 500-1000mg  up to 3 times daily or 1-2 aleve up to 2 times daily   We recommend the following healthy lifestyle measures: - eat a healthy diet consisting of lots of vegetables, fruits, beans, nuts, seeds, healthy meats such as white chicken and fish and whole grains.  - avoid fried foods, fast food, processed foods, sodas, red meet and other fattening foods.  - get a least 150 minutes of aerobic exercise per week.       Colin Benton R.

## 2015-02-28 ENCOUNTER — Other Ambulatory Visit: Payer: Self-pay | Admitting: Family Medicine

## 2015-02-28 ENCOUNTER — Telehealth: Payer: Self-pay | Admitting: *Deleted

## 2015-02-28 DIAGNOSIS — Z1239 Encounter for other screening for malignant neoplasm of breast: Secondary | ICD-10-CM

## 2015-02-28 NOTE — Telephone Encounter (Signed)
Left a message for the pt to return my call.  

## 2015-02-28 NOTE — Telephone Encounter (Signed)
I advise tylenol [phone] mg up to 3 times daily or aleve per instructions only as needed for chronic musculoskeletal pain. Has she been doing the home exercises? If pain is not improving or is that bad despite the home exercises, needs office visit to re-eval.

## 2015-02-28 NOTE — Telephone Encounter (Signed)
Pt stated she seen Dr Maudie Mercury for low back pain last month.  She was wanting know if Dr Maudie Mercury would give her something for pain.  She is not sleeping well at night.  Please advise

## 2015-03-07 NOTE — Telephone Encounter (Signed)
Pt aware, she stated that she has not been doing her home exercises everyday but she will give it a couple more weeks and if not better she will schedule an OV with Dr Maudie Mercury.  Dr Maudie Mercury made aware

## 2015-03-24 ENCOUNTER — Ambulatory Visit: Payer: Self-pay | Admitting: Family Medicine

## 2015-04-09 ENCOUNTER — Other Ambulatory Visit: Payer: Self-pay | Admitting: Family Medicine

## 2015-04-09 MED ORDER — VENLAFAXINE HCL ER 75 MG PO CP24: 225.0000 mg | ORAL_CAPSULE | Freq: Every day | ORAL | Status: DC

## 2015-04-09 NOTE — Progress Notes (Signed)
Received request from pharmacy pt take 225 effexor daily (3 tabs) rx changed.

## 2015-05-12 ENCOUNTER — Ambulatory Visit (INDEPENDENT_AMBULATORY_CARE_PROVIDER_SITE_OTHER): Payer: Self-pay | Admitting: Family Medicine

## 2015-05-12 DIAGNOSIS — R69 Illness, unspecified: Secondary | ICD-10-CM

## 2015-05-12 NOTE — Progress Notes (Signed)
  NO SHOW FOR 30 MIN CPE. 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

## 2015-08-07 ENCOUNTER — Telehealth: Payer: Self-pay | Admitting: Family Medicine

## 2015-08-07 DIAGNOSIS — Z1211 Encounter for screening for malignant neoplasm of colon: Secondary | ICD-10-CM

## 2015-08-07 NOTE — Telephone Encounter (Signed)
Ok to [employer] for [employer] - I placed referral.  I think some of our folks see the dental practice here in our plaza and like it [facility]. Dr. Morrie Sheldon is good, but is in [place]. Please give her the optho list. Thanks.

## 2015-08-07 NOTE — Telephone Encounter (Signed)
Pt states she never went for the colonoscopy because she lost her job. Pt now works for [employer] and the previous referral dr Maudie Mercury put in has expired. Pt would like to know if dr Maudie Mercury will do another referral for GI.  Also, pt wants to know if Dr Maudie Mercury knows a good dentist and an eye dr she can see. Pt does not need a referral to be seen by these drs, just does not know where to go.

## 2015-08-08 ENCOUNTER — Encounter: Payer: Self-pay | Admitting: Gastroenterology

## 2015-08-08 NOTE — Telephone Encounter (Signed)
Left a message for the pt to return my call.  

## 2015-08-20 NOTE — Telephone Encounter (Signed)
I called the pt and informed her of the information below.

## 2015-08-25 ENCOUNTER — Ambulatory Visit: Payer: Self-pay

## 2015-09-10 ENCOUNTER — Ambulatory Visit: Payer: Self-pay

## 2015-10-14 ENCOUNTER — Encounter: Payer: Self-pay | Admitting: Gastroenterology

## 2015-11-01 ENCOUNTER — Encounter (HOSPITAL_COMMUNITY): Payer: Self-pay | Admitting: Emergency Medicine

## 2015-11-01 ENCOUNTER — Emergency Department (HOSPITAL_COMMUNITY)
Admission: EM | Admit: 2015-11-01 | Discharge: 2015-11-01 | Disposition: A | Discharge status: Home / Self Care | Payer: 59 | Attending: Emergency Medicine | Admitting: Emergency Medicine

## 2015-11-01 ENCOUNTER — Emergency Department (HOSPITAL_COMMUNITY): Payer: 59

## 2015-11-01 DIAGNOSIS — Z79899 Other long term (current) drug therapy: Secondary | ICD-10-CM | POA: Insufficient documentation

## 2015-11-01 DIAGNOSIS — Z7951 Long term (current) use of inhaled steroids: Secondary | ICD-10-CM | POA: Insufficient documentation

## 2015-11-01 DIAGNOSIS — R079 Chest pain, unspecified: Secondary | ICD-10-CM | POA: Diagnosis present

## 2015-11-01 DIAGNOSIS — R0789 Other chest pain: Secondary | ICD-10-CM | POA: Insufficient documentation

## 2015-11-01 DIAGNOSIS — Z8742 Personal history of other diseases of the female genital tract: Secondary | ICD-10-CM | POA: Diagnosis not present

## 2015-11-01 DIAGNOSIS — Z862 Personal history of diseases of the blood and blood-forming organs and certain disorders involving the immune mechanism: Secondary | ICD-10-CM | POA: Insufficient documentation

## 2015-11-01 DIAGNOSIS — G8929 Other chronic pain: Secondary | ICD-10-CM | POA: Insufficient documentation

## 2015-11-01 DIAGNOSIS — Z8542 Personal history of malignant neoplasm of other parts of uterus: Secondary | ICD-10-CM | POA: Diagnosis not present

## 2015-11-01 DIAGNOSIS — F419 Anxiety disorder, unspecified: Secondary | ICD-10-CM | POA: Insufficient documentation

## 2015-11-01 DIAGNOSIS — F329 Major depressive disorder, single episode, unspecified: Secondary | ICD-10-CM | POA: Insufficient documentation

## 2015-11-01 DIAGNOSIS — I10 Essential (primary) hypertension: Secondary | ICD-10-CM | POA: Insufficient documentation

## 2015-11-01 DIAGNOSIS — E669 Obesity, unspecified: Secondary | ICD-10-CM | POA: Diagnosis not present

## 2015-11-01 LAB — CBC
HCT: 37.5 % (ref 36.0–46.0)
Hemoglobin: 12.5 g/dL (ref 12.0–15.0)
MCH: 26.3 pg (ref 26.0–34.0)
MCHC: 33.3 g/dL (ref 30.0–36.0)
MCV: 78.8 fL (ref 78.0–100.0)
Platelets: 243 10*3/uL (ref 150–400)
RBC: 4.76 MIL/uL (ref 3.87–5.11)
RDW: 14.4 % (ref 11.5–15.5)
WBC: 8.2 10*3/uL (ref 4.0–10.5)

## 2015-11-01 LAB — BASIC METABOLIC PANEL
Anion gap: 10 (ref 5–15)
BUN: 9 mg/dL (ref 6–20)
CO2: 29 mmol/L (ref 22–32)
Calcium: 9.6 mg/dL (ref 8.9–10.3)
Chloride: 102 mmol/L (ref 101–111)
Creatinine, Ser: 0.92 mg/dL (ref 0.44–1.00)
GFR calc Af Amer: >60 mL/min (ref >60)
GFR calc non Af Amer: >60 mL/min (ref >60)
Glucose, Bld: 119 mg/dL — ABNORMAL HIGH (ref 65–99)
Potassium: 3.6 mmol/L (ref 3.5–5.1)
Sodium: 141 mmol/L (ref 135–145)

## 2015-11-01 LAB — I-STAT TROPONIN, ED: Troponin i, poc: 0.01 ng/mL (ref 0.00–0.08)

## 2015-11-01 MED ORDER — ASPIRIN 81 MG PO CHEW
324.0000 mg | CHEWABLE_TABLET | Freq: Once | ORAL | Status: AC
  Administered 2015-11-01: 324 mg via ORAL
  Filled 2015-11-01: qty 4

## 2015-11-01 MED ORDER — IBUPROFEN 800 MG PO TABS
800.0000 mg | ORAL_TABLET | Freq: Once | ORAL | Status: AC
  Administered 2015-11-01: 800 mg via ORAL
  Filled 2015-11-01: qty 1

## 2015-11-01 MED ORDER — GI COCKTAIL ~~LOC~~
30.0000 mL | Freq: Once | ORAL | Status: AC
  Administered 2015-11-01: 30 mL via ORAL
  Filled 2015-11-01: qty 30

## 2015-11-01 MED ORDER — NAPROXEN 500 MG PO TABS: 500.0000 mg | ORAL_TABLET | Freq: Two times a day (BID) | ORAL | Status: DC

## 2015-11-01 NOTE — Discharge Instructions (Signed)

## 2015-11-01 NOTE — ED Notes (Signed)
Pt denies chest pain at this time.

## 2015-11-01 NOTE — ED Provider Notes (Signed)
CSN: [id number]     Arrival date & time 11/01/15  0025 History   First MD Initiated Contact with Patient 11/01/15 0102     Chief Complaint  Patient presents with  . Chest Pain     (Consider location/radiation/quality/duration/timing/severity/associated sxs/prior Treatment) HPI  Patient is a 60 year old female with history of hypertension, presents to the emergency department with left-sided upper chest pain which began at 10 PM while she was at work moving equipment. She describes the pain as a pressure and sharp which is worse when she pushes on it. There is no increase in chest pain with inspiration or exertion. She states she was sweating before the pain began. She did not have any nausea, vomiting, diarrhea, sweating, lightheadedness, or shortness of breath with the onset of chest pain.  She denies any recent URI symptoms, no cough, no fever.  She denies palpitations,no LE swelling. No recent travel.  The patient is not on any hormone replacement therapy is not a smoker.  At the time of exam in the ER the patient does not have any chest pain at rest but states she can reproduce it if she presses on her chest wall.  She states her father died of a heart attack at age 55.  Past Medical History  Diagnosis Date  . HTN (hypertension)   . Anxiety and depression   . Low back pain     chronic, reports hx DDD treated by chiropractor  . Hx of iron deficiency anemia     reports [place] trait; reports on iron her whole life  . Seasonal allergies   . No blood products     Jehovah's Witness  . OAB (overactive bladder)   . Uterine fibroid    Past Surgical History  Procedure Laterality Date  . Cesarean section    . Tubal ligation    . Foot surgery    . Tumor removal      behind left eye   Family History  Problem Relation Age of Onset  . Diabetes Father   . Hypertension Father   . Dementia Father     senile  . Bipolar disorder Sister   . Schizophrenia Sister   . Schizophrenia Sister   .  Bipolar disorder Sister   . Schizophrenia Sister   . Bipolar disorder Sister   . Other Mother     smoker  . Lung cancer Mother     2010  . Bipolar disorder Daughter    Social History  Substance Use Topics  . Smoking status: Never Smoker   . Smokeless tobacco: None  . Alcohol Use: Yes     Comment: socially - rare; 2 drinks a few times per month   OB History    No data available     Review of Systems  Constitutional: Negative for fever, chills, diaphoresis, activity change, appetite change and fatigue.  HENT: Negative.   Eyes: Negative.   Respiratory: Negative for apnea, cough, choking, chest tightness, shortness of breath, wheezing and stridor.   Cardiovascular: Positive for chest pain. Negative for palpitations and leg swelling.  Gastrointestinal: Negative.  Negative for nausea, vomiting, abdominal pain, diarrhea and constipation.  Endocrine: Negative.   Genitourinary: Negative.   Musculoskeletal: Negative.   Skin: Negative.   Neurological: Negative for dizziness, syncope, weakness, light-headedness and headaches.  Hematological: Negative.   Psychiatric/Behavioral: Negative.       Allergies  Review of patient's allergies indicates no known allergies.  Home Medications   Prior to Admission medications  Medication Sig Start Date End Date Taking? Authorizing Provider  calcium-vitamin D (CALCIUM + D) 250-125 MG-UNIT per tablet Take 1 tablet by mouth daily. 02/14/14  Yes Robbie Lis, MD  Fe Fum-FA-B Cmp-C-Zn-Mg-Mn-Cu (HEMOCYTE PLUS) 106-1 MG CAPS Take 1 capsule by mouth daily. 02/14/14  Yes Robbie Lis, MD  fluticasone [facility]) 50 MCG/ACT nasal spray Place 1 spray into both nostrils at bedtime. 01/21/15  Yes Lucretia Kern, DO  lisinopril (PRINIVIL,ZESTRIL) 5 MG tablet Take 1 tablet (5 mg total) by mouth daily. 01/21/15  Yes Lucretia Kern, DO  OVER THE COUNTER MEDICATION Take 2 tablets by mouth daily. Biotin 5067mcg   Yes Historical Provider, MD  tolterodine (DETROL LA) 4 MG  24 hr capsule Take 1 capsule (4 mg total) by mouth daily. 01/21/15  Yes Lucretia Kern, DO  venlafaxine XR (EFFEXOR-XR) 75 MG 24 hr capsule Take 3 capsules (225 mg total) by mouth daily with breakfast. 04/09/15  Yes Lucretia Kern, DO  naproxen (NAPROSYN) 500 MG tablet Take 1 tablet (500 mg total) by mouth 2 (two) times daily with a meal. 11/01/15   Delsa Grana, PA-C   BP 153/75 mmHg  Pulse 77  Temp(Src) 98.7 F (37.1 C)  Resp 15  SpO2 95% Physical Exam  Constitutional: She is oriented to person, place, and time. Vital signs are normal. She appears well-developed and well-nourished. She is cooperative.  Non-toxic appearance. She does not have a sickly appearance. No distress.  Well appearing female, appears stated age, smiling, cooperative, NAD  HENT:  Head: Normocephalic and atraumatic.  Nose: Nose normal.  Mouth/Throat: Oropharynx is clear and moist. No oropharyngeal exudate.  Eyes: Conjunctivae and EOM are normal. Pupils are equal, round, and reactive to light. Right eye exhibits no discharge. Left eye exhibits no discharge. No scleral icterus.  Neck: Normal range of motion. No JVD present. No tracheal deviation present. No thyromegaly present.  Cardiovascular: Normal rate, regular rhythm, normal heart sounds and intact distal pulses.  Exam reveals no gallop and no friction rub.   No murmur heard. Radial pulse and DP pulses 2+ and symmetrical, no LE edema  Pulmonary/Chest: Effort normal and breath sounds normal. No accessory muscle usage. No respiratory distress. She has no decreased breath sounds. She has no wheezes. She has no rhonchi. She has no rales. She exhibits tenderness.    ttp over left upper anterior chest wall  Abdominal: Soft. Bowel sounds are normal. She exhibits no distension and no mass. There is no tenderness. There is no rebound and no guarding.  Musculoskeletal: Normal range of motion. She exhibits no edema or tenderness.  Lymphadenopathy:    She has no cervical  adenopathy.  Neurological: She is alert and oriented to person, place, and time. She has normal reflexes. No cranial nerve deficit. She exhibits normal muscle tone. Coordination normal.  Skin: Skin is warm and dry. No rash noted. She is not diaphoretic. No erythema. No pallor.  Psychiatric: She has a normal mood and affect. Her behavior is normal. Judgment and thought content normal.  Nursing note and vitals reviewed.   ED Course  Procedures (including critical care time) Labs Review Labs Reviewed  BASIC METABOLIC PANEL - Abnormal; Notable for the following:    Glucose, Bld 119 (*)    All other components within normal limits  CBC  I-STAT TROPOININ, ED    Imaging Review Dg Chest 2 View  11/01/2015  CLINICAL DATA:  60 year old female with left-sided chest pain EXAM: CHEST  2  VIEW COMPARISON:  Radiograph dated 04/12/2012 FINDINGS: The heart size and mediastinal contours are within normal limits. Both lungs are clear. The visualized skeletal structures are unremarkable. IMPRESSION: No active cardiopulmonary disease. Electronically Signed   By: Anner Crete M.D.   On: 11/01/2015 01:22   I have personally reviewed and evaluated these images and lab results as part of my medical decision-making.   EKG Interpretation   Date/Time:  Saturday November 01 2015 00:30:57 EST Ventricular Rate:  88 PR Interval:  150 QRS Duration: 80 QT Interval:  368 QTC Calculation: 445 R Axis:   51 Text Interpretation:  Normal sinus rhythm Normal ECG No significant change  was found Confirmed by CAMPOS  MD, Lennette Bihari ([id number]) on 11/01/2015 2:10:09 AM      MDM   Patient with left-sided chest pain which began at 10 PM Patient has cardiac risk factors of hypertension and obesity, chest pain history non-concerning for ACS, reproducible with palpation of chest wall, began after moving and lifting heavy equipment at work.   The patient's lab work chest x-ray and EKG are unremarkable, EKG shows normal sinus  rhythm, troponin negative  Patient's pain is most consistent with muscle skeletal pain, will be discharged with NSAIDs. Return precautions reviewed. Patient to follow-up with her PCP.  Final diagnoses:  Left-sided chest wall pain      Delsa Grana, PA-C 11/02/15 [place], MD 11/02/15 [id number]

## 2015-11-01 NOTE — ED Notes (Signed)
C/o sharp L sided chest pain with mild sob since 10pm.  Denies nausea and vomiting.

## 2015-11-10 MED FILL — TOLTERODINE TART ER 4 MG CA: 4 | 90 days supply | Qty: 90 | Fill #2

## 2015-11-10 MED FILL — FLUTICASONE PROP 50 MCG SPR: 50 | 30 days supply | Qty: 16 | Fill #6

## 2015-11-10 MED FILL — VENLAFAXINE HCL ER 75 MG CA: 75 | 90 days supply | Qty: 90 | Fill #1

## 2015-12-07 ENCOUNTER — Emergency Department (HOSPITAL_COMMUNITY)
Admission: EM | Admit: 2015-12-07 | Discharge: 2015-12-07 | Disposition: A | Discharge status: Home / Self Care | Payer: 59 | Source: Home / Self Care | Attending: Emergency Medicine | Admitting: Emergency Medicine

## 2015-12-07 ENCOUNTER — Encounter (HOSPITAL_COMMUNITY): Payer: Self-pay | Admitting: Emergency Medicine

## 2015-12-07 DIAGNOSIS — R059 Cough, unspecified: Secondary | ICD-10-CM

## 2015-12-07 DIAGNOSIS — J988 Other specified respiratory disorders: Secondary | ICD-10-CM | POA: Diagnosis not present

## 2015-12-07 DIAGNOSIS — R05 Cough: Secondary | ICD-10-CM | POA: Diagnosis not present

## 2015-12-07 MED ORDER — AMOXICILLIN-POT CLAVULANATE 875-125 MG PO TABS: 1.0000 | ORAL_TABLET | Freq: Two times a day (BID) | ORAL | Status: DC

## 2015-12-07 MED ORDER — HYDROCODONE-HOMATROPINE 5-1.5 MG/5ML PO SYRP
5.0000 mL | ORAL_SOLUTION | Freq: Four times a day (QID) | ORAL | Status: DC | PRN
Start: 2015-12-07 — End: 2016-01-06

## 2015-12-07 NOTE — Discharge Instructions (Signed)
Cough, Adult A cough helps to clear your throat and lungs. A cough may last only 2-3 weeks (acute), or it may last longer than 8 weeks (chronic). Many different things can cause a cough. A cough may be a sign of an illness or another medical condition. HOME CARE  Pay attention to any changes in your cough.  Take medicines only as told by your doctor.  If you were prescribed an antibiotic medicine, take it as told by your doctor. Do not stop taking it even if you start to feel better.  Talk with your doctor before you try using a cough medicine.  Drink enough fluid to keep your pee (urine) clear or pale yellow.  If the air is dry, use a cold steam vaporizer or humidifier in your home.  Stay away from things that make you cough at work or at home.  If your cough is worse at night, try using extra pillows to raise your head up higher while you sleep.  Do not smoke, and try not to be around smoke. If you need help quitting, ask your doctor.  Do not have caffeine.  Do not drink alcohol.  Rest as needed. GET HELP IF:  You have new problems (symptoms).  You cough up yellow fluid (pus).  Your cough does not get better after 2-3 weeks, or your cough gets worse.  Medicine does not help your cough and you are not sleeping well.  You have pain that gets worse or pain that is not helped with medicine.  You have a fever.  You are losing weight and you do not know why.  You have night sweats. GET HELP RIGHT AWAY IF:  You cough up blood.  You have trouble breathing.  Your heartbeat is very fast.   This information is not intended to replace advice given to you by your health care provider. Make sure you discuss any questions you have with your health care provider.  You have a respiratory infection. Covering you with antibiotics. May use night time cough medication as needed or daytime Delsym is best. Mucinex also works well for congestion with a lot of water. Feel better.      Document Released: 06/10/2011 Document Revised: 06/18/2015 Document Reviewed: 12/04/2014 Elsevier Interactive Patient Education [employer].

## 2015-12-07 NOTE — ED Notes (Addendum)
Pt here with frequent cough, nasal congestion and yellow phlegm that started 4 days ago Taking Tussin for cough, diarrhea reported with sx's

## 2015-12-07 NOTE — ED Provider Notes (Signed)
CSN: [id number]     Arrival date & time 12/07/15  1545 History   First MD Initiated Contact with Patient 12/07/15 1718     Chief Complaint  Patient presents with  . Cough  . URI   (Consider location/radiation/quality/duration/timing/severity/associated sxs/prior Treatment) HPI Comments: Rachel Fields is a 60 yo black female who presents with a week of productive cough (yellow), malaise, sinus congestion and subjective fevers. Known "crud" going around at work.   Patient is a 60 y.o. female presenting with cough and URI. The history is provided by the patient.  Cough Associated symptoms: myalgias   Associated symptoms: no shortness of breath and no wheezing   URI Presenting symptoms: cough   Associated symptoms: myalgias   Associated symptoms: no wheezing     Past Medical History  Diagnosis Date  . HTN (hypertension)   . Anxiety and depression   . Low back pain     chronic, reports hx DDD treated by chiropractor  . Hx of iron deficiency anemia     reports [place] trait; reports on iron her whole life  . Seasonal allergies   . No blood products     Jehovah's Witness  . OAB (overactive bladder)   . Uterine fibroid    Past Surgical History  Procedure Laterality Date  . Cesarean section    . Tubal ligation    . Foot surgery    . Tumor removal      behind left eye   Family History  Problem Relation Age of Onset  . Diabetes Father   . Hypertension Father   . Dementia Father     senile  . Bipolar disorder Sister   . Schizophrenia Sister   . Schizophrenia Sister   . Bipolar disorder Sister   . Schizophrenia Sister   . Bipolar disorder Sister   . Other Mother     smoker  . Lung cancer Mother     2010  . Bipolar disorder Daughter    Social History  Substance Use Topics  . Smoking status: Never Smoker   . Smokeless tobacco: None  . Alcohol Use: Yes     Comment: socially - rare; 2 drinks a few times per month   OB History    No data available     Review of Systems   HENT: Positive for postnasal drip and sinus pressure.   Respiratory: Positive for cough. Negative for shortness of breath and wheezing.   Cardiovascular: Negative.   Musculoskeletal: Positive for myalgias.  Psychiatric/Behavioral: Negative.     Allergies  Review of patient's allergies indicates no known allergies.  Home Medications   Prior to Admission medications   Medication Sig Start Date End Date Taking? Authorizing Provider  amoxicillin-clavulanate (AUGMENTIN) 875-125 MG tablet Take 1 tablet by mouth 2 (two) times daily. 12/07/15   Bjorn Pippin, PA-C  calcium-vitamin D (CALCIUM + D) 250-125 MG-UNIT per tablet Take 1 tablet by mouth daily. 02/14/14   Robbie Lis, MD  Fe Fum-FA-B Cmp-C-Zn-Mg-Mn-Cu (HEMOCYTE PLUS) 106-1 MG CAPS Take 1 capsule by mouth daily. 02/14/14   Robbie Lis, MD  fluticasone (FLONASE) 50 MCG/ACT nasal spray Place 1 spray into both nostrils at bedtime. 01/21/15   Lucretia Kern, DO  HYDROcodone-homatropine (HYCODAN) 5-1.5 MG/5ML syrup Take 5 mLs by mouth every 6 (six) hours as needed for cough. 12/07/15   Bjorn Pippin, PA-C  lisinopril (PRINIVIL,ZESTRIL) 5 MG tablet Take 1 tablet (5 mg total) by mouth daily. 01/21/15  Lucretia Kern, DO  naproxen (NAPROSYN) 500 MG tablet Take 1 tablet (500 mg total) by mouth 2 (two) times daily with a meal. 11/01/15   Delsa Grana, PA-C  OVER THE COUNTER MEDICATION Take 2 tablets by mouth daily. Biotin 503mcg    Historical Provider, MD  tolterodine (DETROL LA) 4 MG 24 hr capsule Take 1 capsule (4 mg total) by mouth daily. 01/21/15   Lucretia Kern, DO  venlafaxine XR (EFFEXOR-XR) 75 MG 24 hr capsule Take 3 capsules (225 mg total) by mouth daily with breakfast. 04/09/15   Lucretia Kern, DO   Meds Ordered and Administered this Visit  Medications - No data to display  BP 178/81 mmHg  Pulse 85  Temp(Src) 98.1 F (36.7 C) (Oral)  Resp 17  SpO2 100% No data found.   Physical Exam  Constitutional: She is oriented to person, place,  and time. She appears well-developed and well-nourished. No distress.  HENT:  Head: Normocephalic and atraumatic.  Right Ear: External ear normal.  Left Ear: External ear normal.  Mouth/Throat: Oropharynx is clear and moist. No oropharyngeal exudate.  Neck: Normal range of motion.  Cardiovascular: Normal rate and regular rhythm.   Pulmonary/Chest: Effort normal. No respiratory distress. She has no wheezes. She has no rales.  Few rhonchi in the bases, mild crackle  Lymphadenopathy:    She has no cervical adenopathy.  Neurological: She is alert and oriented to person, place, and time.  Skin: Skin is warm and dry. She is not diaphoretic.  Psychiatric: Her behavior is normal.  Nursing note and vitals reviewed.   ED Course  Procedures (including critical care time)  Labs Review Labs Reviewed - No data to display  Imaging Review No results found.   Visual Acuity Review  Right Eye Distance:   Left Eye Distance:   Bilateral Distance:    Right Eye Near:   Left Eye Near:    Bilateral Near:         MDM   1. Respiratory infection   2. Cough    Given duration and exam treat empirically with abx therapy. Supportive care with Hycodan, rest and fluids. Work note given for 2 days if needed.    Bjorn Pippin, PA-C 12/07/15 1745

## 2015-12-18 ENCOUNTER — Telehealth: Payer: Self-pay | Admitting: Family Medicine

## 2015-12-18 NOTE — Telephone Encounter (Signed)
I left a message at the pts home number to return my call. 

## 2015-12-18 NOTE — Telephone Encounter (Signed)
Rachel Fields w/ [facility] called to advise   pt called them and states she lost her rx  venlafaxine XR (EFFEXOR-XR) 75 MG 24 hr capsule  Pt has refills, but, since it is 52 days early, they need  Dr Maudie Mercury permission to refill early.

## 2015-12-18 NOTE — Telephone Encounter (Signed)
Please call pt. Have not seen her in almost 1 year. Needs CPE in next 1 month. Assit her in scheduling, Then ok to send 1 month refill to get to visit.

## 2015-12-19 MED ORDER — VENLAFAXINE HCL ER 75 MG PO CP24: 225.0000 mg | ORAL_CAPSULE | Freq: Every day | ORAL | Status: DC

## 2015-12-19 MED ORDER — FLUTICASONE PROPIONATE 50 MCG/ACT NA SUSP: 1.0000 | Freq: Every day | NASAL | Status: DC

## 2015-12-19 MED FILL — FLUTICASONE PROP 50 MCG SPR: 50 | 60 days supply | Qty: 16 | Fill #0

## 2015-12-19 MED FILL — VENLAFAXINE HCL ER 75 MG CA: 75 | 30 days supply | Qty: 90 | Fill #0

## 2015-12-19 NOTE — Telephone Encounter (Signed)
I called the pt and informed her of the message below.  Physical appt was scheduled for 4/10 and she is aware the Rx was sent to her pharmacy along with Flonase which she requested today.  She stated the pharmacy will be sending a request for pain medication refill (?Tramadol) and I advised her I have not seen this but will forward this message to Dr Maudie Mercury.

## 2015-12-19 NOTE — Telephone Encounter (Signed)
I am not prescribing tramadol for her. Is she seeing another provider for this.

## 2015-12-19 NOTE — Telephone Encounter (Signed)
I called the pt and informed her of the message below

## 2016-01-06 ENCOUNTER — Ambulatory Visit (INDEPENDENT_AMBULATORY_CARE_PROVIDER_SITE_OTHER): Payer: 59 | Admitting: Family Medicine

## 2016-01-06 ENCOUNTER — Encounter: Payer: Self-pay | Admitting: Family Medicine

## 2016-01-06 VITALS — BP 144/82 | HR 106 | Temp 98.0°F | Ht 67.0 in | Wt 206.1 lb

## 2016-01-06 DIAGNOSIS — R030 Elevated blood-pressure reading, without diagnosis of hypertension: Secondary | ICD-10-CM

## 2016-01-06 DIAGNOSIS — Z1211 Encounter for screening for malignant neoplasm of colon: Secondary | ICD-10-CM | POA: Diagnosis not present

## 2016-01-06 DIAGNOSIS — M545 Low back pain, unspecified: Secondary | ICD-10-CM

## 2016-01-06 DIAGNOSIS — IMO0001 Reserved for inherently not codable concepts without codable children: Secondary | ICD-10-CM

## 2016-01-06 MED ORDER — CYCLOBENZAPRINE HCL 5 MG PO TABS: 5.0000 mg | ORAL_TABLET | Freq: Every day | ORAL | Status: DC

## 2016-01-06 MED FILL — CYCLOBENZAPRINE 5 MG TABLET: 5 | 30 days supply | Qty: 30 | Fill #0

## 2016-01-06 NOTE — Progress Notes (Signed)
HPI:  Rachel Fields is a pleasant 60 year old whom I have not seen in about 1 year. She is here today for an acute visit regarding low back pain. She reports this pain is chronic for at least 10 years. She reports workup remotely with MRI and x-rays. She reports treated by a chiropractor about 4 or 5 years ago. Reports moderate to severe pain at times in the bilateral lower back muscles that is worsened with prolonged sitting or with pushing or lifting, especially if he does so with improper mechanics. Denies radiation of pain, weakness or numbness in the lower extremities, malaise, fevers, bowel or bladder dysfunction. She uses over-the-counter analgesics on occasion, which are helpful with the pain. She has also found that naproxen and muscle relaxers are helpful for the pain. She has not done physical therapy in a long time, but reports this did help in the past.  ROS: See pertinent positives and negatives per HPI.  Past Medical History  Diagnosis Date  . HTN (hypertension)   . Anxiety and depression   . Low back pain     chronic, reports hx DDD treated by chiropractor  . Hx of iron deficiency anemia     reports [place] trait; reports on iron her whole life  . Seasonal allergies   . No blood products     Jehovah's Witness  . OAB (overactive bladder)   . Uterine fibroid     Past Surgical History  Procedure Laterality Date  . Cesarean section    . Tubal ligation    . Foot surgery    . Tumor removal      behind left eye    Family History  Problem Relation Age of Onset  . Diabetes Father   . Hypertension Father   . Dementia Father     senile  . Bipolar disorder Sister   . Schizophrenia Sister   . Schizophrenia Sister   . Bipolar disorder Sister   . Schizophrenia Sister   . Bipolar disorder Sister   . Other Mother     smoker  . Lung cancer Mother     2010  . Bipolar disorder Daughter     Social History   Social History  . Marital Status: Single    Spouse Name: N/A  .  Number of Children: N/A  . Years of Education: N/A   Social History Main Topics  . Smoking status: Never Smoker   . Smokeless tobacco: None  . Alcohol Use: Yes     Comment: socially - rare; 2 drinks a few times per month  . Drug Use: No  . Sexual Activity: Not Asked   Other Topics Concern  . None   Social History Narrative   Work or School: Quarry manager, working on Paediatric nurse Situation: Lives with husband - married in 2015; hx of homelessness in 2015      Spiritual Beliefs: Jehovah's Witness      Lifestyle: no regular CV exercise; diet improving - reports she has been able to loose weight                       Current outpatient prescriptions:  .  calcium-vitamin D (CALCIUM + D) 250-125 MG-UNIT per tablet, Take 1 tablet by mouth daily., Disp: 30 tablet, Rfl: 3 .  Fe Fum-FA-B Cmp-C-Zn-Mg-Mn-Cu (HEMOCYTE PLUS) 106-1 MG CAPS, Take 1 capsule by mouth daily., Disp: 30 each, Rfl: 3 .  fluticasone (FLONASE) 50 MCG/ACT  nasal spray, Place 1 spray into both nostrils at bedtime., Disp: 16 g, Rfl: 0 .  lisinopril (PRINIVIL,ZESTRIL) 5 MG tablet, Take 1 tablet (5 mg total) by mouth daily., Disp: 90 tablet, Rfl: 3 .  naproxen (NAPROSYN) 500 MG tablet, Take 1 tablet (500 mg total) by mouth 2 (two) times daily with a meal., Disp: 30 tablet, Rfl: 0 .  OVER THE COUNTER MEDICATION, Take 2 tablets by mouth daily. Biotin 5068mcg, Disp: , Rfl:  .  tolterodine (DETROL LA) 4 MG 24 hr capsule, Take 1 capsule (4 mg total) by mouth daily., Disp: 90 capsule, Rfl: 3 .  venlafaxine XR (EFFEXOR-XR) 75 MG 24 hr capsule, Take 3 capsules (225 mg total) by mouth daily with breakfast., Disp: 90 capsule, Rfl: 0 .  cyclobenzaprine (FLEXERIL) 5 MG tablet, Take 1 tablet (5 mg total) by mouth at bedtime., Disp: 30 tablet, Rfl: 0  EXAM:  Filed Vitals:   01/06/16 1615  BP: 144/82  Pulse: 106  Temp: 98 F (36.7 C)    Body mass index is 32.27 kg/(m^2).  GENERAL: vitals reviewed and listed above, alert,  oriented, appears well hydrated and in no acute distress  HEENT: atraumatic, conjunttiva clear, no obvious abnormalities on inspection of external nose and ears  NECK: no obvious masses on inspection  LUNGS: clear to auscultation bilaterally, no wheezes, rales or rhonchi, good air movement  CV: HRRR, no peripheral edema  MS: moves all extremities without noticeable abnormality Normal Gait Normal inspection of back, no obvious scoliosis or leg length descrepancy No bony TTP Soft tissue TTP at: Bilateral lumbar paraspinal muscles -/+ tests: neg trendelenburg,-facet loading, -SLRT, -CLRT, -FABER, -FADIR Normal muscle strength, sensation to light touch and DTRs in LEs bilaterally  PSYCH: pleasant and cooperative, no obvious depression or anxiety  ASSESSMENT AND PLAN:  Discussed the following assessment and plan:  Bilateral low back pain without sciatica - Plan: DG Lumbar Spine Complete -Discussed potential etiologies and evaluation- -Long-standing, suspect DDD, lumbar strain -No neurological deficits or severe symptoms -We will obtain plain lumbar films, start physical therapy with home exercises provided,  proper mechanics when lifting and pulling discussed, muscle relaxer at night, and as needed over-the-counter analgesics after discussion of risks/benefits -Follow up in 1 month   Colon cancer screening - Plan: Ambulatory referral to Gastroenterology  Elevated blood pressure  Poor compliance with follow-up in the past. Needs physical. Many health maintenance measures to. Advised physical. Recheck blood pressure before leaving. Follow up in 1 month.    -Patient advised to return or notify a doctor immediately if symptoms worsen or persist or new concerns arise.  Patient Instructions  BEFORE YOU LEAVE: -low back exercises -xray sheet -recheck blood pressure -schedule physical/back follow up in 1 month  Flexeril at night  Tylenol 500-1000mg  up to 3 times daily as needed  for pain  Heat 15 minutes twice daily  Proper mechanics when lifting  Ease into regular aerobic exercise  Do the exercises provided 4 days per week     Ayauna Mcnay R.

## 2016-01-06 NOTE — Patient Instructions (Addendum)
BEFORE YOU LEAVE: -low back exercises -xray sheet -recheck blood pressure -schedule physical/back follow up in 1 month  Flexeril at night  Tylenol 500-1000mg  up to 3 times daily as needed for pain  Heat 15 minutes twice daily  Proper mechanics when lifting  Ease into regular aerobic exercise  Do the exercises provided 4 days per week

## 2016-01-06 NOTE — Progress Notes (Signed)
Pre visit review using our clinic review tool, if applicable. No additional management support is needed unless otherwise documented below in the visit note. 

## 2016-01-14 ENCOUNTER — Emergency Department (HOSPITAL_COMMUNITY): Payer: 59

## 2016-01-14 ENCOUNTER — Encounter (HOSPITAL_COMMUNITY): Payer: Self-pay | Admitting: Emergency Medicine

## 2016-01-14 ENCOUNTER — Emergency Department (HOSPITAL_COMMUNITY)
Admission: EM | Admit: 2016-01-14 | Discharge: 2016-01-14 | Disposition: A | Discharge status: Home / Self Care | Payer: 59 | Attending: Emergency Medicine | Admitting: Emergency Medicine

## 2016-01-14 ENCOUNTER — Ambulatory Visit (INDEPENDENT_AMBULATORY_CARE_PROVIDER_SITE_OTHER): Payer: 59 | Admitting: Family Medicine

## 2016-01-14 DIAGNOSIS — R234 Changes in skin texture: Secondary | ICD-10-CM | POA: Diagnosis not present

## 2016-01-14 DIAGNOSIS — Z86018 Personal history of other benign neoplasm: Secondary | ICD-10-CM | POA: Diagnosis not present

## 2016-01-14 DIAGNOSIS — Z7951 Long term (current) use of inhaled steroids: Secondary | ICD-10-CM | POA: Diagnosis not present

## 2016-01-14 DIAGNOSIS — G8929 Other chronic pain: Secondary | ICD-10-CM | POA: Diagnosis not present

## 2016-01-14 DIAGNOSIS — I10 Essential (primary) hypertension: Secondary | ICD-10-CM | POA: Insufficient documentation

## 2016-01-14 DIAGNOSIS — Z79899 Other long term (current) drug therapy: Secondary | ICD-10-CM | POA: Insufficient documentation

## 2016-01-14 DIAGNOSIS — N3281 Overactive bladder: Secondary | ICD-10-CM | POA: Diagnosis not present

## 2016-01-14 DIAGNOSIS — M79675 Pain in left toe(s): Secondary | ICD-10-CM | POA: Diagnosis not present

## 2016-01-14 DIAGNOSIS — Z791 Long term (current) use of non-steroidal anti-inflammatories (NSAID): Secondary | ICD-10-CM | POA: Insufficient documentation

## 2016-01-14 DIAGNOSIS — F419 Anxiety disorder, unspecified: Secondary | ICD-10-CM | POA: Diagnosis not present

## 2016-01-14 DIAGNOSIS — F329 Major depressive disorder, single episode, unspecified: Secondary | ICD-10-CM | POA: Insufficient documentation

## 2016-01-14 DIAGNOSIS — D509 Iron deficiency anemia, unspecified: Secondary | ICD-10-CM | POA: Insufficient documentation

## 2016-01-14 DIAGNOSIS — Z0289 Encounter for other administrative examinations: Secondary | ICD-10-CM

## 2016-01-14 DIAGNOSIS — R69 Illness, unspecified: Secondary | ICD-10-CM

## 2016-01-14 NOTE — ED Notes (Signed)
Patient able to ambulate independently  

## 2016-01-14 NOTE — ED Notes (Signed)
Pt. reports left middle toe pain for 2 weeks , denies injury . No swelling or deformity.

## 2016-01-14 NOTE — Progress Notes (Signed)
No-show. However, it appears this visit was scheduled in error. Patient has a set up in 1 month as well. Have advised assistant that patient not be charged a no-show fee.  On recheck. 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; in school for RN -denied in the past: hx of SI, manic depression, hospitalization  Sickle Cell Trait/Reports hx of iron def anemia her whole life: -hx of daily iron -hx heavy menstrual bleeding -denies:  Seasonal Allergies: -uses flonase nasal spray -seasonal nasal symptoms  Hx over active bladder -chronic after had 3 kids -on detrol in the past and really did great on this  Back pain: -seen 1 week ago for this and adviesed CPE/f/u in 1 month -ongoing for > 10 years -flares intermittently -pain, mod-severe at times with certain movements, bilat low back muscles -no radiation, fevers, malaise, weakness or numbness in leg or bowel/bladder dysfunction -Nsaids help, muscle relaxers help -report had plain films, MRI and PT remotely

## 2016-01-14 NOTE — ED Provider Notes (Signed)
CSN: [id number]     Arrival date & time 01/14/16  1918 History  By signing my name below, I, Rachel Fields, attest that this documentation has been prepared under the direction and in the presence of [place], NP.   Electronically Signed: [name], ED Scribe 01/15/2016 at 9:23 PM.    Chief Complaint  Patient presents with  . Toe Injury    Patient is a 60 y.o. female presenting with toe pain. The history is provided by the patient. No language interpreter was used.  Toe Pain This is a new problem. The current episode started more than 2 days ago. The problem occurs constantly. The problem has not changed since onset.Nothing aggravates the symptoms. Nothing relieves the symptoms. She has tried nothing for the symptoms.   HPI Comments: Rachel Fields is a 60 y.o. female who presents to the Emergency Department complaining of gradual onset, left third toe pain, onset about one week ago. She states she is on her feet a lot for her job. No associated symptoms noted. No worsening or alleviating factors noted. Pt denies injuring the area, drainage, swelling, deformity, or any other pertinent symptoms.   Past Medical History  Diagnosis Date  . HTN (hypertension)   . Anxiety and depression   . Low back pain     chronic, reports hx DDD treated by chiropractor  . Hx of iron deficiency anemia     reports [place] trait; reports on iron her whole life  . Seasonal allergies   . No blood products     Jehovah's Witness  . OAB (overactive bladder)   . Uterine fibroid    Past Surgical History  Procedure Laterality Date  . Cesarean section    . Tubal ligation    . Foot surgery    . Tumor removal      behind left eye   Family History  Problem Relation Age of Onset  . Diabetes Father   . Hypertension Father   . Dementia Father     senile  . Bipolar disorder Sister   . Schizophrenia Sister   . Schizophrenia Sister   . Bipolar disorder Sister   . Schizophrenia Sister   . Bipolar  disorder Sister   . Other Mother     smoker  . Lung cancer Mother     2010  . Bipolar disorder Daughter    Social History  Substance Use Topics  . Smoking status: Never Smoker   . Smokeless tobacco: None  . Alcohol Use: Yes     Comment: socially - rare; 2 drinks a few times per month   OB History    No data available     Review of Systems  Musculoskeletal: Positive for arthralgias. Negative for joint swelling.       Left, third toe pain.   Skin: Negative for color change, rash and wound.    Allergies  Review of patient's allergies indicates no known allergies.  Home Medications   Prior to Admission medications   Medication Sig Start Date End Date Taking? Authorizing Provider  calcium-vitamin D (CALCIUM + D) 250-125 MG-UNIT per tablet Take 1 tablet by mouth daily. 02/14/14   Robbie Lis, MD  cyclobenzaprine (FLEXERIL) 5 MG tablet Take 1 tablet (5 mg total) by mouth at bedtime. 01/06/16   Lucretia Kern, DO  Fe Fum-FA-B Cmp-C-Zn-Mg-Mn-Cu (HEMOCYTE PLUS) 106-1 MG CAPS Take 1 capsule by mouth daily. 02/14/14   Robbie Lis, MD  fluticasone (FLONASE) 50 MCG/ACT  nasal spray Place 1 spray into both nostrils at bedtime. 12/19/15   Lucretia Kern, DO  lisinopril (PRINIVIL,ZESTRIL) 5 MG tablet Take 1 tablet (5 mg total) by mouth daily. 01/21/15   Lucretia Kern, DO  naproxen (NAPROSYN) 500 MG tablet Take 1 tablet (500 mg total) by mouth 2 (two) times daily with a meal. 11/01/15   Delsa Grana, PA-C  OVER THE COUNTER MEDICATION Take 2 tablets by mouth daily. Biotin 5058mcg    Historical Provider, MD  tolterodine (DETROL LA) 4 MG 24 hr capsule Take 1 capsule (4 mg total) by mouth daily. 01/21/15   Lucretia Kern, DO  venlafaxine XR (EFFEXOR-XR) 75 MG 24 hr capsule Take 3 capsules (225 mg total) by mouth daily with breakfast. 12/19/15   Lucretia Kern, DO   BP 174/87 mmHg  Pulse 95  Temp(Src) 97.7 F (36.5 C) (Oral)  Resp 18  Ht 5\' 7"  (1.702 m)  Wt 209 lb 8 oz (95.029 kg)  BMI 32.80 kg/m2  SpO2  97% Physical Exam  Constitutional: She is oriented to person, place, and time. She appears well-developed and well-nourished.  HENT:  Head: Normocephalic.  Eyes: EOM are normal.  Neck: Normal range of motion.  Cardiovascular: Normal rate.   Pulmonary/Chest: Effort normal.  Musculoskeletal: Normal range of motion. She exhibits no edema.       Left foot: There is tenderness. There is normal range of motion, no swelling, normal capillary refill and no deformity.       Feet:  Pulses 2+. Sensation normal.   Neurological: She is alert and oriented to person, place, and time. No sensory deficit.  Skin: Skin is warm and dry.  Cracks in the skin between the left 3rd and 4th toes and at the base of the 3rd toe plantar aspect. Tender on exam  Psychiatric: She has a normal mood and affect. Her behavior is normal.  Nursing note and vitals reviewed.   ED Course  Procedures (including critical care time) DIAGNOSTIC STUDIES: Oxygen Saturation is 97% on RA, normal by my interpretation.    COORDINATION OF CARE: 9:07 PM-Discussed treatment plan which includes DG toe 3rd left with pt at bedside and pt agreed to plan.  Imaging Review Dg Toe 3rd Left  01/14/2016  CLINICAL DATA:  Nontraumatic left third toe pain for 1 week. EXAM: LEFT THIRD TOE COMPARISON:  None. FINDINGS: Negative for acute fracture, dislocation or radiopaque foreign body. No bone lesion or bony destruction. No soft tissue gas. Prior osteotomy at the first MTP. IMPRESSION: No acute findings. Electronically Signed   By: Andreas Newport M.D.   On: 01/14/2016 21:26    MDM  60 y.o. female with left toe pain x 2 weeks stable for d/c without acute finding on x-ray. No red streaking or signs of infection. I discussed break down in skin between toes and at base of third toe and need for drying toes completely after bath or shower. She will f/u with Dr. Paulla Dolly for further evaluation.   Final diagnoses:  Toe pain, left    I personally  performed the services described in this documentation, which was scribed in my presence. The recorded information has been reviewed and is accurate.    [place], [place] 01/15/16 2129  Veryl Speak, MD 01/15/16 [phone]

## 2016-01-14 NOTE — Discharge Instructions (Signed)
Your x-ray tonight shows not broken bone, dislocation, foreign body or signs of infection. We are buddy taping your toes and putting cotton between them. Be sure to dry between your toes and follow up with Dr. Paulla Dolly.

## 2016-01-23 ENCOUNTER — Other Ambulatory Visit: Payer: Self-pay | Admitting: Family Medicine

## 2016-01-26 MED FILL — VENLAFAXINE HCL ER 75 MG CA: 75 | 30 days supply | Qty: 90 | Fill #0

## 2016-01-27 ENCOUNTER — Other Ambulatory Visit: Payer: Self-pay | Admitting: Family Medicine

## 2016-01-27 MED FILL — FLUTICASONE PROP 50 MCG SPR: 50 | 60 days supply | Qty: 16 | Fill #0

## 2016-02-03 ENCOUNTER — Telehealth: Payer: Self-pay | Admitting: *Deleted

## 2016-02-03 MED ORDER — LISINOPRIL 5 MG PO TABS: 5.0000 mg | ORAL_TABLET | Freq: Every day | ORAL | Status: DC

## 2016-02-03 MED FILL — LISINOPRIL 5 MG TABLET: 5 | 90 days supply | Qty: 90 | Fill #0

## 2016-02-03 NOTE — Telephone Encounter (Signed)
I called the pt and informed her of the message below and she stated she will discuss this at her appt on 4/27.

## 2016-02-03 NOTE — Telephone Encounter (Signed)
Lisinopril sent. I don't usually recommend or prescribe opioid pain medications (tramadol) for chronic musculoskeletal pain. Can discuss pain concerns at her appointment.okay to work in sooner if she cannot wait until her scheduled appointment.

## 2016-02-03 NOTE — Telephone Encounter (Signed)
[facility] [phone] Refill request  Tramadol HCL 50 mg tablet Take 1 tablet by mouth every 6 hours as needed

## 2016-02-03 NOTE — Telephone Encounter (Signed)
Patient also request a refill of Lisinopril 5 mg, 1 tab daily [place]

## 2016-02-05 ENCOUNTER — Encounter: Payer: 59 | Admitting: Family Medicine

## 2016-02-17 ENCOUNTER — Encounter: Payer: 59 | Admitting: Family Medicine

## 2016-02-25 ENCOUNTER — Ambulatory Visit (INDEPENDENT_AMBULATORY_CARE_PROVIDER_SITE_OTHER)
Admission: RE | Admit: 2016-02-25 | Discharge: 2016-02-25 | Disposition: A | Discharge status: Home / Self Care | Payer: 59 | Source: Ambulatory Visit | Attending: Family Medicine | Admitting: Family Medicine

## 2016-02-25 DIAGNOSIS — M545 Low back pain, unspecified: Secondary | ICD-10-CM

## 2016-02-25 DIAGNOSIS — M5136 Other intervertebral disc degeneration, lumbar region: Secondary | ICD-10-CM | POA: Diagnosis not present

## 2016-02-26 ENCOUNTER — Ambulatory Visit (INDEPENDENT_AMBULATORY_CARE_PROVIDER_SITE_OTHER): Payer: 59 | Admitting: Family Medicine

## 2016-02-26 ENCOUNTER — Other Ambulatory Visit (HOSPITAL_COMMUNITY)
Admission: RE | Admit: 2016-02-26 | Discharge: 2016-02-26 | Disposition: A | Discharge status: Home / Self Care | Payer: 59 | Source: Ambulatory Visit | Attending: Family Medicine | Admitting: Family Medicine

## 2016-02-26 ENCOUNTER — Encounter: Payer: Self-pay | Admitting: Family Medicine

## 2016-02-26 VITALS — BP 140/90 | HR 86 | Temp 98.0°F | Ht 66.0 in | Wt 201.4 lb

## 2016-02-26 DIAGNOSIS — Z0001 Encounter for general adult medical examination with abnormal findings: Secondary | ICD-10-CM

## 2016-02-26 DIAGNOSIS — M545 Low back pain: Secondary | ICD-10-CM

## 2016-02-26 DIAGNOSIS — Z1151 Encounter for screening for human papillomavirus (HPV): Secondary | ICD-10-CM | POA: Insufficient documentation

## 2016-02-26 DIAGNOSIS — Z1211 Encounter for screening for malignant neoplasm of colon: Secondary | ICD-10-CM

## 2016-02-26 DIAGNOSIS — I10 Essential (primary) hypertension: Secondary | ICD-10-CM

## 2016-02-26 DIAGNOSIS — Z124 Encounter for screening for malignant neoplasm of cervix: Secondary | ICD-10-CM

## 2016-02-26 DIAGNOSIS — F341 Dysthymic disorder: Secondary | ICD-10-CM

## 2016-02-26 DIAGNOSIS — Z01419 Encounter for gynecological examination (general) (routine) without abnormal findings: Secondary | ICD-10-CM | POA: Insufficient documentation

## 2016-02-26 DIAGNOSIS — E785 Hyperlipidemia, unspecified: Secondary | ICD-10-CM

## 2016-02-26 DIAGNOSIS — M79675 Pain in left toe(s): Secondary | ICD-10-CM

## 2016-02-26 DIAGNOSIS — Z0000 Encounter for general adult medical examination without abnormal findings: Secondary | ICD-10-CM | POA: Diagnosis not present

## 2016-02-26 LAB — HEMOGLOBIN A1C: Hgb A1c MFr Bld: 6.4 % (ref 4.6–6.5)

## 2016-02-26 LAB — LIPID PANEL
Cholesterol: 206 mg/dL — ABNORMAL HIGH (ref 0–200)
HDL: 47.6 mg/dL (ref >39.00)
LDL Cholesterol: 140 mg/dL — ABNORMAL HIGH (ref 0–99)
NonHDL: 158.68
Total CHOL/HDL Ratio: 4
Triglycerides: 92 mg/dL (ref 0.0–149.0)
VLDL: 18.4 mg/dL (ref 0.0–40.0)

## 2016-02-26 MED ORDER — VENLAFAXINE HCL ER 75 MG PO CP24: ORAL_CAPSULE | ORAL | Status: DC

## 2016-02-26 MED ORDER — LISINOPRIL 5 MG PO TABS: 5.0000 mg | ORAL_TABLET | Freq: Every day | ORAL | Status: DC

## 2016-02-26 MED ORDER — TOLTERODINE TARTRATE ER 4 MG PO CP24: 4.0000 mg | ORAL_CAPSULE | Freq: Every day | ORAL | Status: DC

## 2016-02-26 MED ORDER — CYCLOBENZAPRINE HCL 5 MG PO TABS: 5.0000 mg | ORAL_TABLET | Freq: Every day | ORAL | Status: DC

## 2016-02-26 MED ORDER — LISINOPRIL 10 MG PO TABS: 10.0000 mg | ORAL_TABLET | Freq: Every day | ORAL | Status: DC

## 2016-02-26 MED ORDER — FLUTICASONE PROPIONATE 50 MCG/ACT NA SUSP: 1.0000 | Freq: Every day | NASAL | Status: DC

## 2016-02-26 MED FILL — VENLAFAXINE HCL ER 75 MG CA: 75 | 90 days supply | Qty: 270 | Fill #0

## 2016-02-26 NOTE — Patient Instructions (Addendum)
BEFORE YOU LEAVE: -labs -follow up in 3-4 months  Call the breast center today to schedule your mammogram  -We placed a referral for you as discussed for the back pain and for a colonoscopy. It usually takes about 1-2 weeks to process and schedule this referral. If you have not heard from [place] regarding this appointment in 2 weeks please contact our office.  Refill on flexeril sent Do the home exercises 4 days per week for the back See the specialist as planned per your request  For the toe pain: -insert/pad between 3rd and 4th toes -shoes with wide toe and plenty of room -call if this is not helping and we will send referral for podiatrist  We recommend the following healthy lifestyle measures: - eat a healthy whole foods diet consisting of regular small meals composed of vegetables, fruits, beans, nuts, seeds, healthy meats such as white chicken and fish and whole grains.  - avoid sweets, white starchy foods, fried foods, fast food, processed foods, sodas, red meet and other fattening foods.  - get a least 150-300 minutes of aerobic exercise per week.

## 2016-02-26 NOTE — Progress Notes (Signed)
Pre visit review using our clinic review tool, if applicable. No additional management support is needed unless otherwise documented below in the visit note. 

## 2016-02-26 NOTE — Progress Notes (Signed)
HPI:  Here for CPE. She has a number of concerns she also wishes to address today. We will do our best. She was given a heads up and written handout that explained that extra charge can occur when addressing new or worsening issues at a physical exam.  Concerns and/or follow up today:   Back pain: -ongoing for > 10 years; feels worse for 4 months -flares intermittently, she finally did xray advised at last appt yesterday (radiology report not yet available) -she has not been doing exercises advised at last visit -pain, mod-severe at times with certain movements, bilat low back muscles -no radiation, fevers, malaise, weakness or numbness in leg or bowel/bladder dysfunction -Nsaids help, tylenol and muscle relaxers help a lot -she wants a referral to a specialist for this and wonders if she should have work restrictions -reports had plain films, MRI and PT remotely  Toe pain -L 3rd and 4th digits -for > several months -went to ER for this as reports pain is severe at times -she thinks is ingrown toenail -denies swelling, redness, weakness, injury  HTN: -meds: lisinopril 5 mg -needs refills  GAD/Depression: -meds: effexor xr 75 mg -reports is doing great as long as on effexor -she was gong to the [facility] - for medications, when she was homeless  -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; in school for RN -denied in the past: hx of SI, manic depression, hospitalization  Sickle Cell Trait/Reports hx of iron def anemia her whole life: -hx of daily iron -hx heavy menstrual bleeding -no bleeding currently  Seasonal Allergies: -uses flonase nasal spray -seasonal nasal symptoms  Hx over active bladder -chronic after had 3 kids -on detrol in the past and really did great on this  -Diet: variety of foods, balance and well rounded, larger portion sizes  -Exercise: no regular exercise  -Taking folic acid, vitamin D or calcium: no  -Diabetes and Dyslipidemia Screening:  FASTING for labs  -Vaccines: UTD  -pap history: she thinks last pap was 4 years ago, reports never had abnormal pap  -FDLMP: n/a, no bleedings  -sexual activity: yes, female partner, no new partners  -wants STI testing (Hep C if born [birth year]): no  -FH breast, colon or ovarian ca: see FH Last mammogram: advised to schedule Last colon cancer screening: no FH, wants colonoscopy after discussion options  -Alcohol, Tobacco, drug use: see social history  Review of Systems - no fevers, unintentional weight loss, vision loss, hearing loss, chest pain, sob, hemoptysis, melena, hematochezia, hematuria, genital discharge, changing or concerning skin lesions, bleeding, bruising, loc, thoughts of self harm or SI  Past Medical History  Diagnosis Date  . HTN (hypertension)   . Anxiety and depression   . Low back pain     chronic, reports hx DDD treated by chiropractor  . Hx of iron deficiency anemia     reports  trait; reports on iron her whole life  . Seasonal allergies   . No blood products     Jehovah's Witness  . OAB (overactive bladder)   . Uterine fibroid     Past Surgical History  Procedure Laterality Date  . Cesarean section    . Tubal ligation    . Foot surgery    . Tumor removal      behind left eye    Family History  Problem Relation Age of Onset  . Diabetes Father   . Hypertension Father   . Dementia Father     senile  .  Bipolar disorder Sister   . Schizophrenia Sister   . Schizophrenia Sister   . Bipolar disorder Sister   . Schizophrenia Sister   . Bipolar disorder Sister   . Other Mother     smoker  . Lung cancer Mother     2010  . Bipolar disorder Daughter     Social History   Social History  . Marital Status: Single    Spouse Name: N/A  . Number of Children: N/A  . Years of Education: N/A   Social History Main Topics  . Smoking status: Never Smoker   . Smokeless tobacco: None  . Alcohol Use: Yes     Comment: socially - rare; 2 drinks a few  times per month  . Drug Use: No  . Sexual Activity: Not Asked   Other Topics Concern  . None   Social History Narrative   Work or School: Quarry manager, working on Paediatric nurse Situation: Lives with husband - married in 2015; hx of homelessness in 2015      Spiritual Beliefs: Jehovah's Witness      Lifestyle: no regular CV exercise; diet improving - reports she has been able to loose weight                       Current outpatient prescriptions:  .  calcium-vitamin D (CALCIUM + D) 250-125 MG-UNIT per tablet, Take 1 tablet by mouth daily., Disp: 30 tablet, Rfl: 3 .  cyclobenzaprine (FLEXERIL) 5 MG tablet, Take 1 tablet (5 mg total) by mouth at bedtime., Disp: 30 tablet, Rfl: 0 .  Fe Fum-FA-B Cmp-C-Zn-Mg-Mn-Cu (HEMOCYTE PLUS) 106-1 MG CAPS, Take 1 capsule by mouth daily., Disp: 30 each, Rfl: 3 .  fluticasone (FLONASE) 50 MCG/ACT nasal spray, Place 1 spray into both nostrils at bedtime., Disp: 16 g, Rfl: 3 .  lisinopril (PRINIVIL,ZESTRIL) 10 MG tablet, Take 1 tablet (10 mg total) by mouth daily., Disp: 90 tablet, Rfl: 3 .  naproxen (NAPROSYN) 500 MG tablet, Take 1 tablet (500 mg total) by mouth 2 (two) times daily with a meal., Disp: 30 tablet, Rfl: 0 .  OVER THE COUNTER MEDICATION, Take 2 tablets by mouth daily. Biotin 50102mcg, Disp: , Rfl:  .  tolterodine (DETROL LA) 4 MG 24 hr capsule, Take 1 capsule (4 mg total) by mouth daily., Disp: 90 capsule, Rfl: 3 .  venlafaxine XR (EFFEXOR-XR) 75 MG 24 hr capsule, Take 3 capsules by mouth daily with breakfast, Disp: 270 capsule, Rfl: 3  EXAM:  Filed Vitals:   02/26/16 0737  BP: 140/90  Pulse: 86  Temp: 98 F (36.7 C)    GENERAL: vitals reviewed and listed below, alert, oriented, appears well hydrated and in no acute distress  HEENT: head atraumatic, PERRLA, normal appearance of eyes, ears, nose and mouth. moist mucus membranes.  NECK: supple, no masses or lymphadenopathy  LUNGS: clear to auscultation bilaterally, no rales,  rhonchi or wheeze  CV: HRRR, no peripheral edema or cyanosis, normal pedal pulses  BREAST: normal appearance - no lesions or discharge, on palpation normal breast tissue without any suspicious masses  ABDOMEN: bowel sounds normal, soft, non tender to palpation, no masses, no rebound or guarding  GU: normal appearance of external genitalia - no lesions or masses, normal vaginal mucosa - no abnormal discharge, normal appearance of cervix - no lesions or abnormal discharge, limited pelvic exam due to body habitus.  SKIN: no rash or abnormal lesions  MS:  normal gait, moves all extremities normally, normal gait - no limping, moves into the lithotomy position for her pap without any signs of weakness or disability in the lower extermities, L 4th digit curves medially under 3rd digit and edge of nail articulates with distal IP jt in area of pain, no swelling or signs of infection, no ingrown toenail, pes planus   NEURO: CN II-XII grossly intact, normal muscle strength and sensation to light touch on extremities  PSYCH: normal affect, pleasant and cooperative  ASSESSMENT AND PLAN:  She was here for a physical, but had a number of issues to address today including new problems and worsening or changing chronic issues that required significant work above and beyond the routine physical.  Discussed the following assessment and plan:  Visit for preventive health examination - Plan: Lipid Panel, Hemoglobin A1c, HIV antibody (with reflex), Hep C Antibody -Discussed and advised all [place] preventive services health task force level A and B recommendations for age, sex and risks. -referral for colonoscopy -pap obtained -advised needs to do mammo, offered to set up, she prefers to call to schedule, number provided -Advised at least 150 minutes of exercise per week and a healthy diet low in saturated fats and sweets and consisting of fresh fruits and vegetables, lean meats such as fish and white chicken and  whole grains. -FASTING labs, studies and vaccines per orders this encounter  Low back pain without sciatica, unspecified back pain laterality - Plan: Ambulatory referral to Orthopedic Surgery, Ambulatory referral to Orthopedic Surgery -she wonders if needs work restrictions for direct pt care with her back issues, she wants to see specialist and referral place. I did recommend proper body mechanics and advised will defer to specialist to make determination if work restrictions or limitations needed as advised regular activities and exercise usually has > benefits then risks if done properly -she has not done the HEP, advised to start -advised PT to learn proper mechanics while doing her work may be helpful, advised staying active and exercise would likely have more benefits then risks -tylenol and flexeril for symptoms as this seems to help -xray report pending at time of appt -received report after appt with mild-mod ddd - attempted to call pt to go over report, LM and will have assistant review with her and provide her with copy of report to take to her specialist appt  DEPRESSION/ANXIETY -she reports she is doing great -continue effexor  HYPERTENSION, BENIGN -elevated -will advise increased lisinopril to 10mg  -recheck in 1 month  Dyslipidemia -labs  Pain of toe of left foot -this does not appear to be an ingrown toenail, but rather a toe deformity/mechanical issue -I think a pad between the toes and a wide toe box may help short term and she opted to try this -no infection, corn, callus or ingrown toenail today -advised podiatry eval if pain persist and she agreed to call if wishes to pursue  Colon cancer screening - Plan: Ambulatory referral to Gastroenterology -discussed options, she wishes to do colonscopy  Cervical cancer screening - Plan: PAP [[place]]   Orders Placed This Encounter  Procedures  . Lipid Panel  . Hemoglobin A1c  . HIV antibody (with reflex)  . Hep C  Antibody  . Ambulatory referral to Orthopedic Surgery    Referral Priority:  Routine    Referral Type:  Surgical    Referral Reason:  Specialty Services Required    Requested Specialty:  Orthopedic Surgery    Number of Visits  Requested:  1  . Ambulatory referral to Gastroenterology    Referral Priority:  Routine    Referral Type:  Consultation    Referral Reason:  Specialty Services Required    Number of Visits Requested:  1  . Ambulatory referral to Orthopedic Surgery    Referral Priority:  Routine    Referral Type:  Surgical    Referral Reason:  Specialty Services Required    Requested Specialty:  Orthopedic Surgery    Number of Visits Requested:  1    Patient advised to return to clinic immediately if symptoms worsen or persist or new concerns.  Patient Instructions  BEFORE YOU LEAVE: -labs -follow up in 3-4 months  Call the breast center today to schedule your mammogram  -We placed a referral for you as discussed for the back pain and for a colonoscopy. It usually takes about 1-2 weeks to process and schedule this referral. If you have not heard from [place] regarding this appointment in 2 weeks please contact our office.  Refill on flexeril sent Do the home exercises 4 days per week for the back See the specialist as planned per your request  For the toe pain: -insert/pad between 3rd and 4th toes -shoes with wide toe and plenty of room -call if this is not helping and we will send referral for podiatrist  We recommend the following healthy lifestyle measures: - eat a healthy whole foods diet consisting of regular small meals composed of vegetables, fruits, beans, nuts, seeds, healthy meats such as white chicken and fish and whole grains.  - avoid sweets, white starchy foods, fried foods, fast food, processed foods, sodas, red meet and other fattening foods.  - get a least 150-300 minutes of aerobic exercise per week.      No Follow-up on file.  Colin Benton R.

## 2016-02-27 LAB — HIV ANTIBODY (ROUTINE TESTING W REFLEX): HIV 1&2 Ab, 4th Generation: NONREACTIVE

## 2016-02-27 LAB — HEPATITIS C ANTIBODY: HCV Ab: NEGATIVE

## 2016-03-01 LAB — CYTOLOGY - PAP

## 2016-03-03 MED FILL — TOLTERODINE TART ER 4 MG CA: 4 | 30 days supply | Qty: 30 | Fill #0

## 2016-03-04 ENCOUNTER — Telehealth: Payer: Self-pay | Admitting: Family Medicine

## 2016-03-04 NOTE — Telephone Encounter (Signed)
Pt would like a call back about her x-ray  results 

## 2016-03-09 ENCOUNTER — Encounter: Payer: Self-pay | Admitting: *Deleted

## 2016-03-09 NOTE — Telephone Encounter (Signed)
No answer at the pts cell number and message at home number states this number is no longer accepting calls.

## 2016-03-09 NOTE — Telephone Encounter (Signed)
Pt following up on request for xray results. Please call on [phone]  Also pt is using more than one spray of fluticasone (FLONASE) 50 MCG/ACT nasal spray  Would like another rx sent to  [facility] out pt pharm/ [address]

## 2016-03-09 NOTE — Telephone Encounter (Signed)
Patient called back and I informed her of the results.  She asked that the instructions for Flonase be changed as one spray does not help her.  Message forwarded to Dr Maudie Mercury.

## 2016-03-10 NOTE — Telephone Encounter (Signed)
Ok to use flonase 2 sprays each nostril daily for 1 month, but then should be one spray after that. Thanks

## 2016-03-10 NOTE — Telephone Encounter (Signed)
Attempted to contact patient on home & mobile. Unable to leave message. No voicemail

## 2016-03-11 MED ORDER — FLUTICASONE PROPIONATE 50 MCG/ACT NA SUSP: 2.0000 | Freq: Every day | NASAL | Status: DC

## 2016-03-11 NOTE — Addendum Note (Signed)
Addended by: Agnes Lawrence on: 03/11/2016 07:57 AM   Modules accepted: Orders

## 2016-03-11 NOTE — Telephone Encounter (Signed)
Rx done.  No answer at the pts cell number.

## 2016-03-26 ENCOUNTER — Telehealth: Payer: Self-pay | Admitting: Family Medicine

## 2016-03-26 ENCOUNTER — Ambulatory Visit: Payer: 59 | Admitting: *Deleted

## 2016-03-26 NOTE — Telephone Encounter (Signed)
Spoke with patient and stated that we can perform the 10 panel drug test here. Informed her that Dr. Maudie Mercury was concerned about her job accepting it if it was done here and that without a diagnosis code the cost of the test could be really expensive. Patient stated she had one previously done through [facility] and that she would see if she could get the results from that. Patient is scheduled for an appointment on Monday but didn't want to cancel it until she found out about getting a copy of the results from [facility].

## 2016-03-26 NOTE — Telephone Encounter (Signed)
[facility] Day - Client [place] Call Center Patient Name: ARABEL BOO DOB: 1956-03-13 Initial Comment Caller states, has question about 10 panel drug screenings. She needs to know if this office does it here ? she needs it for her employer. Nurse Assessment Nurse: Dimas Chyle, RN, Dellis Filbert Date/Time Eilene Ghazi Time): 03/26/2016 10:30:02 AM Confirm and document reason for call. If symptomatic, describe symptoms. You must click the next button to save text entered. ---Caller states, has question about 10 panel drug screenings. She needs to know if this office does it here? She needs it for her employer. Has the patient traveled out of the country within the last 30 days? ---No Does the patient have any new or worsening symptoms? ---No Please document clinical information provided and list any resource used. ---Contacted office on backline and they advised that the lab at the office was able to do a 10 panel drug screen but has to be ordered by PCP. Advised caller and scheduled appointment with PCP for lab order. Guidelines Guideline Title Affirmed Question Affirmed Notes Final Disposition User Clinical Call [place], RN, Dellis Filbert

## 2016-03-29 ENCOUNTER — Other Ambulatory Visit: Payer: Self-pay | Admitting: Family Medicine

## 2016-03-30 ENCOUNTER — Encounter: Payer: Self-pay | Admitting: Family Medicine

## 2016-04-02 ENCOUNTER — Ambulatory Visit (INDEPENDENT_AMBULATORY_CARE_PROVIDER_SITE_OTHER): Payer: 59 | Admitting: *Deleted

## 2016-04-02 DIAGNOSIS — Z111 Encounter for screening for respiratory tuberculosis: Secondary | ICD-10-CM

## 2016-04-05 ENCOUNTER — Ambulatory Visit: Payer: 59 | Admitting: *Deleted

## 2016-04-05 ENCOUNTER — Encounter: Payer: Self-pay | Admitting: *Deleted

## 2016-04-05 LAB — TB SKIN TEST
Induration: 0 mm
TB Skin Test: NEGATIVE

## 2016-04-07 ENCOUNTER — Other Ambulatory Visit: Payer: Self-pay | Admitting: Family Medicine

## 2016-04-07 DIAGNOSIS — Z1231 Encounter for screening mammogram for malignant neoplasm of breast: Secondary | ICD-10-CM

## 2016-04-08 DIAGNOSIS — M5136 Other intervertebral disc degeneration, lumbar region: Secondary | ICD-10-CM | POA: Diagnosis not present

## 2016-04-09 ENCOUNTER — Ambulatory Visit: Payer: 59

## 2016-04-22 ENCOUNTER — Ambulatory Visit
Admission: RE | Admit: 2016-04-22 | Discharge: 2016-04-22 | Disposition: A | Discharge status: Home / Self Care | Payer: 59 | Source: Ambulatory Visit | Attending: Family Medicine | Admitting: Family Medicine

## 2016-04-22 DIAGNOSIS — Z1231 Encounter for screening mammogram for malignant neoplasm of breast: Secondary | ICD-10-CM

## 2016-04-27 ENCOUNTER — Encounter: Payer: Self-pay | Admitting: Physical Therapy

## 2016-04-27 ENCOUNTER — Ambulatory Visit: Payer: 59 | Attending: Family Medicine | Admitting: Physical Therapy

## 2016-04-27 DIAGNOSIS — M545 Low back pain: Secondary | ICD-10-CM | POA: Insufficient documentation
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
## 2016-04-27 NOTE — Patient Instructions (Signed)
Access Code: [email or web address]  URL: [email or web address]  Date: 04/27/2016  Prepared by: Selinda Eon   Exercises  Hooklying Transversus Abdominis Palpation  Supine Double Knee to Chest - 5 reps - 1 sets - 5 hold - 2x daily - 7x weekly  Hooklying Isometric Hip Abduction Adduciton with Belt and Ball - 10 reps - 1 sets - 5 hold - 2x daily - 7x weekly

## 2016-04-28 ENCOUNTER — Ambulatory Visit: Payer: 59 | Admitting: Physical Therapy

## 2016-04-28 ENCOUNTER — Encounter: Payer: Self-pay | Admitting: Physical Therapy

## 2016-04-28 DIAGNOSIS — M545 Low back pain: Secondary | ICD-10-CM | POA: Diagnosis not present

## 2016-04-28 NOTE — Therapy (Signed)
[place], [place], [address] Phone: [phone]   Fax:  [phone]  Physical Therapy Treatment  Patient Details  Name: Rachel Fields MRN: [id number] Date of Birth: Oct 30, 1955 Referring Provider: Wandra Feinstein, MD  Encounter Date: 04/28/2016      PT End of Session - 04/28/16 1547    Visit Number 2   Number of Visits 13   Date for PT Re-Evaluation 06/11/16   Authorization Type [employer]   PT Start Time 1548   PT Stop Time 1637   PT Time Calculation (min) 49 min   Activity Tolerance Patient tolerated treatment well   Behavior During Therapy [facility] for tasks assessed/performed      Past Medical History  Diagnosis Date  . HTN (hypertension)   . Anxiety and depression   . Low back pain     chronic, reports hx DDD treated by chiropractor  . Hx of iron deficiency anemia     reports [place] trait; reports on iron her whole life  . Seasonal allergies   . No blood products     Jehovah's Witness  . OAB (overactive bladder)   . Uterine fibroid     Past Surgical History  Procedure Laterality Date  . Cesarean section    . Tubal ligation    . Foot surgery    . Tumor removal      behind left eye    There were no vitals filed for this visit.      Subjective Assessment - 04/28/16 1548    Subjective Was doing work around house today and lifted plant which was a little too heavy.   Currently in Pain? Yes   Pain Score 6    Pain Location Back   Pain Orientation Lower;Medial   Pain Descriptors / Indicators Sore                         [facility] Adult PT Treatment/Exercise - 04/28/16 0001    Exercises   Exercises Other Exercises   Other Exercises  supine pec stretch   Lumbar Exercises: Stretches   Single Knee to Chest Stretch Other (comment)  10x5s holds each   Quadruped Mid Back Stretch Limitations child post 5x10s   Lumbar Exercises: Aerobic   Stationary Bike Nustep 5 min L6    Lumbar Exercises: Standing   Functional Squats Limitations squats to lift kettle bell 10lb, cues for form/core   Shoulder Extension 20 reps;Theraband   Theraband Level (Shoulder Extension) Level 2 (Red)   Lumbar Exercises: Supine   Bent Knee Raise 20 reps  both, x10with green tband around knees; x10 with UE abd   Bridge 20 reps   Bridge Limitations x10 with ball squeeze, x10 with clam   Lumbar Exercises: Quadruped   Madcat/Old Horse Limitations rounded spine only x10   Moist Heat Therapy   Number Minutes Moist Heat 10 Minutes   Moist Heat Location Lumbar Spine                PT Education - 04/28/16 1551    Education provided Yes   Education Details exercise form/rationale   Person(s) Educated Patient   Methods Explanation;Demonstration;Tactile cues;Verbal cues   Comprehension Verbalized understanding;Returned demonstration;Verbal cues required;Tactile cues required;Need further instruction          PT Short Term Goals - 04/27/16 1335    PT SHORT TERM GOAL #1   Title pt will verbalize ability to incorporate  abdominal control into daily and work activities to reduce pain by 8/8   Time 3   Period Weeks   Status New   PT SHORT TERM GOAL #2   Title Pt will verbalize centralization of pain to lower back   Time 3   Period Weeks   Status New   PT SHORT TERM GOAL #3   Title independent with HEP as it has been established    Time 3   Period Weeks   Status New           PT Long Term Goals - 04/27/16 1336    PT LONG TERM GOAL #1   Title Pt will be able to perform pt transfers with good abdominal control in order to keep LBP <=2/10 by 9/1   Time 6   Period Weeks   Status New   PT LONG TERM GOAL #2   Title Pt will be able to drive her car with LBP <=2/10   Time 6   Period Weeks   Status New   PT LONG TERM GOAL #3   Title Pt will demonstrate ability to appropraitely push/pull with appropriate core control and without LBP   Time 6   Period Weeks   Status  New   PT LONG TERM GOAL #4   Title FOTO to 53% ability to indicate significant functional improvement   Baseline 43% ability at evaluation   Time 6   Period Weeks   Status New               Plan - 04/28/16 1555    Clinical Impression Statement Pt required heavy VC for proper core contraction and verbalized fatigue. Will continue to benfit from functional training and core strengthening to provide support to spine.    PT Next Visit Plan pilates/reformer with Delsa Sale.  Caution: L4 anterolisthesis   Consulted and Agree with Plan of Care Patient      Patient will benefit from skilled therapeutic intervention in order to improve the following deficits and impairments:     Visit Diagnosis: Midline low back pain, with sciatica presence unspecified     Problem List Patient Active Problem List   Diagnosis Date Noted  . Seasonal allergies 01/21/2015  . OAB (overactive bladder) 01/21/2015  . Dyslipidemia 02/14/2014  . Obesity 12/19/2009  . LOW BACK PAIN 03/18/2008  . DEPRESSION/ANXIETY 06/15/2007  . HYPERTENSION, BENIGN 06/15/2007    Earley Grobe C. Yeilyn Gent PT, DPT 04/28/2016 4:41 PM   [place] [facility] [address] [place], [place], [address] Phone: [phone]   Fax:  [phone]  Name: Rachel Fields MRN: [id number] Date of Birth: 06/24/1956

## 2016-05-04 ENCOUNTER — Ambulatory Visit: Payer: 59 | Admitting: Physical Therapy

## 2016-05-11 ENCOUNTER — Encounter: Payer: 59 | Admitting: Physical Therapy

## 2016-05-14 ENCOUNTER — Encounter: Payer: 59 | Admitting: Physical Therapy

## 2016-05-17 ENCOUNTER — Ambulatory Visit: Payer: 59 | Attending: Family Medicine | Admitting: Physical Therapy

## 2016-05-17 DIAGNOSIS — M545 Low back pain: Secondary | ICD-10-CM | POA: Diagnosis not present

## 2016-05-17 NOTE — Therapy (Signed)
Rachel Fields, [place], [address] Phone: [phone]   Fax:  [phone]  Physical Therapy Treatment  Patient Details  Name: Rachel Fields MRN: [id number] Date of Birth: 11/30/1955 Referring Provider: Wandra Feinstein, MD  Encounter Date: 05/17/2016      PT End of Session - 05/17/16 1629    Visit Number 3   Number of Visits 13   Date for PT Re-Evaluation 06/11/16   Authorization Type [employer]   PT Start Time 1600  pt arrived late   PT Stop Time 1644   PT Time Calculation (min) 44 min   Activity Tolerance Patient tolerated treatment well   Behavior During Therapy [facility] for tasks assessed/performed      Past Medical History:  Diagnosis Date  . Anxiety and depression   . HTN (hypertension)   . Hx of iron deficiency anemia    reports [place] trait; reports on iron her whole life  . Low back pain    chronic, reports hx DDD treated by chiropractor  . No blood products    Jehovah's Witness  . OAB (overactive bladder)   . Seasonal allergies   . Uterine fibroid     Past Surgical History:  Procedure Laterality Date  . CESAREAN SECTION    . FOOT SURGERY    . TUBAL LIGATION    . TUMOR REMOVAL     behind left eye    There were no vitals filed for this visit.      Subjective Assessment - 05/17/16 1601    Subjective Pt reports back is wonderful when she does exercises but has been working doubles the last few days and has not been doing them so pain is increased.    Patient Stated Goals driving, pulling heavy objects, bending and lifting, pulling patients, sleep   Currently in Pain? Yes   Pain Score 7    Pain Location Back   Pain Orientation Lower;Medial   Pain Descriptors / Indicators Sore;Nagging   Aggravating Factors  bending, lifting, standing                         [facility] Adult PT Treatment/Exercise - 05/17/16 0001      Lumbar Exercises: Stretches   Quadruped Mid Back  Stretch Limitations cat/camel/child pose in qped  also educated in standing at counter     Lumbar Exercises: Aerobic   Stationary Bike Nustep 5 min L6     Lumbar Exercises: Seated   Hip Flexion on Ball Limitations post pelvic tilts seated on physioball; pelvic tilts paired with quick trunk rotations     Lumbar Exercises: Supine   Ab Set 20 reps     Lumbar Exercises: Quadruped   Madcat/Old Horse Limitations Qped post pelvic tilts 10x10s     Moist Heat Therapy   Number Minutes Moist Heat 15 Minutes   Moist Heat Location Lumbar Spine                PT Education - 05/17/16 1621    Education provided Yes   Education Details exercsise form/rationale, HEP at home and work   Forensic psychologist) Educated Patient   Methods Explanation;Demonstration;Tactile cues;Verbal cues   Comprehension Verbalized understanding;Returned demonstration;Verbal cues required;Tactile cues required;Need further instruction          PT Short Term Goals - 05/17/16 1710      PT SHORT TERM GOAL #1   Title pt will verbalize ability to incorporate abdominal  control into daily and work activities to reduce pain by 8/8   Status Achieved     PT SHORT TERM GOAL #2   Title Pt will verbalize centralization of pain to lower back   Status Achieved     PT SHORT TERM GOAL #3   Title independent with HEP as it has been established    Status Achieved           PT Long Term Goals - 04/27/16 1336      PT LONG TERM GOAL #1   Title Pt will be able to perform pt transfers with good abdominal control in order to keep LBP <=2/10 by 9/1   Time 6   Period Weeks   Status New     PT LONG TERM GOAL #2   Title Pt will be able to drive her car with LBP <=2/10   Time 6   Period Weeks   Status New     PT LONG TERM GOAL #3   Title Pt will demonstrate ability to appropraitely push/pull with appropriate core control and without LBP   Time 6   Period Weeks   Status New     PT LONG TERM GOAL #4   Title FOTO to 53%  ability to indicate significant functional improvement   Baseline 43% ability at evaluation   Time 6   Period Weeks   Status New               Plan - 05/17/16 1708    Clinical Impression Statement Pt presented today very sleepy and high reports of pain that were significantly decreased with treatment. Discussed seated posture which pt required frequent verbal and tactile cuing for avoidance of excessive lumbar extension.    PT Next Visit Plan pilates/reformer with Delsa Sale.  Caution: L4 anterolisthesis   PT Home Exercise Plan transverse abdominis/postural awareness, supine iso LE abd/add   Consulted and Agree with Plan of Care Patient      Patient will benefit from skilled therapeutic intervention in order to improve the following deficits and impairments:     Visit Diagnosis: Midline low back pain, with sciatica presence unspecified     Problem List Patient Active Problem List   Diagnosis Date Noted  . Seasonal allergies 01/21/2015  . OAB (overactive bladder) 01/21/2015  . Dyslipidemia 02/14/2014  . Obesity 12/19/2009  . LOW BACK PAIN 03/18/2008  . DEPRESSION/ANXIETY 06/15/2007  . HYPERTENSION, BENIGN 06/15/2007    Rachel Fields C. Rachel Fields PT, DPT 05/17/16 5:11 PM   [facility] [address] [place], [place], [address] Phone: [phone]   Fax:  [phone]  Name: Rachel Fields MRN: [id number] Date of Birth: [birth year]

## 2016-05-17 NOTE — Patient Instructions (Signed)
Access Code: [email or web address]  URL: [email or web address]  Date: 05/17/2016  Prepared by: Selinda Eon   Exercises  Cat-Camel to Child's Pose - 3 reps - 1 sets - 3 hold - 2x daily - 7x weekly  (written on to perform VERY minimal arch in camel)

## 2016-05-18 ENCOUNTER — Encounter: Payer: 59 | Admitting: Physical Therapy

## 2016-05-20 ENCOUNTER — Ambulatory Visit: Payer: 59 | Admitting: Physical Therapy

## 2016-05-20 ENCOUNTER — Encounter: Payer: 59 | Admitting: Physical Therapy

## 2016-05-20 MED FILL — TOLTERODINE TART ER 4 MG CA: 4 | 30 days supply | Qty: 30 | Fill #1

## 2016-05-20 MED FILL — VENLAFAXINE HCL ER 75 MG CA: 75 | 90 days supply | Qty: 270 | Fill #1

## 2016-05-20 MED FILL — LISINOPRIL 10 MG TABLET: 10 | 90 days supply | Qty: 90 | Fill #0

## 2016-05-21 MED FILL — traMADol HCL 50 MG TABS: 50 | 5 days supply | Qty: 20 | Fill #0

## 2016-05-24 ENCOUNTER — Encounter: Payer: 59 | Admitting: Physical Therapy

## 2016-05-25 ENCOUNTER — Encounter: Payer: 59 | Admitting: Physical Therapy

## 2016-05-25 ENCOUNTER — Ambulatory Visit: Payer: 59 | Admitting: Physical Therapy

## 2016-05-25 DIAGNOSIS — M545 Low back pain: Secondary | ICD-10-CM

## 2016-05-25 NOTE — Therapy (Addendum)
[place], [place], [address] Phone: [phone]   Fax:  [phone]  Physical Therapy Treatment/Discharge  Patient Details  Name: Rachel Fields MRN: [id number] Date of Birth: 1956/01/11 Referring Provider: Wandra Feinstein, MD  Encounter Date: 05/25/2016      PT End of Session - 05/25/16 1554    Visit Number 4   Number of Visits 13   Date for PT Re-Evaluation 06/11/16   Authorization Type [employer]   PT Start Time 9379   PT Stop Time 1630   PT Time Calculation (min) 45 min   Activity Tolerance Patient tolerated treatment well   Behavior During Therapy [facility] for tasks assessed/performed      Past Medical History:  Diagnosis Date  . Anxiety and depression   . HTN (hypertension)   . Hx of iron deficiency anemia    reports [place] trait; reports on iron her whole life  . Low back pain    chronic, reports hx DDD treated by chiropractor  . No blood products    Jehovah's Witness  . OAB (overactive bladder)   . Seasonal allergies   . Uterine fibroid     Past Surgical History:  Procedure Laterality Date  . CESAREAN SECTION    . FOOT SURGERY    . TUBAL LIGATION    . TUMOR REMOVAL     behind left eye    There were no vitals filed for this visit.      Subjective Assessment - 05/25/16 1544    Subjective Today was a good day. Exercises help.  want to learn about Pilates.    Currently in Pain? No/denies      Pilates Reformer used for LE/core strength, postural strength, lumbopelvic disassociation and core control.  Exercises included:  Footwork 2 red 1 blue x 3 sets with various foot positions, cues to relax ribcage and thoracic  Bridging all springs x 8 flat back and x 8 articulating (unable to finish due to hamstring tightness)  Supine Arm work Writer and T press with legs in table top min A  Long box Prone 1 red x 10 pulling straps   Pt tired and requesting MHP              PT  Education - 05/25/16 1553    Education provided Yes   Education Details Pilates    Person(s) Educated Patient   Methods Explanation;Demonstration   Comprehension Verbalized understanding;Returned demonstration;Need further instruction          PT Short Term Goals - 05/17/16 1710      PT SHORT TERM GOAL #1   Title pt will verbalize ability to incorporate abdominal control into daily and work activities to reduce pain by 8/8   Status Achieved     PT SHORT TERM GOAL #2   Title Pt will verbalize centralization of pain to lower back   Status Achieved     PT SHORT TERM GOAL #3   Title independent with HEP as it has been established    Status Achieved           PT Long Term Goals - 05/25/16 1623      PT LONG TERM GOAL #1   Title Pt will be able to perform pt transfers with good abdominal control in order to keep LBP <=2/10 by 9/1   Status Unable to assess     PT LONG TERM GOAL #2   Title Pt will be able to  drive her car with LBP <=2/10   Status Partially Met     PT LONG TERM GOAL #3   Title Pt will demonstrate ability to appropraitely push/pull with appropriate core control and without LBP   Status On-going     PT LONG TERM GOAL #4   Title FOTO to 53% ability to indicate significant functional improvement   Status On-going               Plan - 05/25/16 1621    Clinical Impression Statement Pt having a good day today, no increase in pain with basic Pilates stabilization ex, kept spine in neutral for exercises.        Patient will benefit from skilled therapeutic intervention in order to improve the following deficits and impairments:  Pain, Improper body mechanics, Postural dysfunction, Decreased strength, Difficulty walking, Decreased activity tolerance  Visit Diagnosis: Midline low back pain, with sciatica presence unspecified     Problem List Patient Active Problem List   Diagnosis Date Noted  . Seasonal allergies 01/21/2015  . OAB (overactive  bladder) 01/21/2015  . Dyslipidemia 02/14/2014  . Obesity 12/19/2009  . LOW BACK PAIN 03/18/2008  . DEPRESSION/ANXIETY 06/15/2007  . HYPERTENSION, BENIGN 06/15/2007    Rachel Fields 05/25/2016, 4:26 PM  [facility] [address] [place], [place], [address] Phone: [phone]   Fax:  [phone]  Name: Rachel Fields MRN: [id number] Date of Birth: [birth year]  Raeford Razor, PT 05/25/16 4:26 PM Phone: [phone] Fax: [phone]   PHYSICAL THERAPY DISCHARGE SUMMARY  Visits from Start of Care: 4  Current functional level related to goals / functional outcomes: See above for most recent level of function.    Remaining deficits: Unknown see above    Education / Equipment: HEP and core stab, posture and body mechanics  Plan: Patient agrees to discharge.  Patient goals were partially met. Patient is being discharged due to not returning since the last visit.  ?????    Raeford Razor, PT 07/21/16 2:13 PM Phone: [phone] Fax: [phone]

## 2016-05-26 ENCOUNTER — Ambulatory Visit: Payer: 59 | Admitting: Physical Therapy

## 2016-05-27 ENCOUNTER — Ambulatory Visit: Payer: 59 | Admitting: Family Medicine

## 2016-05-28 ENCOUNTER — Encounter: Payer: 59 | Admitting: Physical Therapy

## 2016-05-31 ENCOUNTER — Encounter: Payer: 59 | Admitting: Physical Therapy

## 2016-06-01 ENCOUNTER — Ambulatory Visit: Payer: 59 | Admitting: Physical Therapy

## 2016-06-02 ENCOUNTER — Encounter: Payer: 59 | Admitting: Physical Therapy

## 2016-06-03 ENCOUNTER — Ambulatory Visit: Payer: 59 | Admitting: Physical Therapy

## 2016-06-11 ENCOUNTER — Other Ambulatory Visit: Payer: Self-pay | Admitting: Sports Medicine

## 2016-06-11 DIAGNOSIS — M5136 Other intervertebral disc degeneration, lumbar region: Secondary | ICD-10-CM

## 2016-06-11 MED FILL — traMADol HCL 50 MG TABS: 50 | 5 days supply | Qty: 20 | Fill #0

## 2016-06-19 ENCOUNTER — Other Ambulatory Visit: Payer: 59

## 2016-06-23 ENCOUNTER — Other Ambulatory Visit: Payer: 59

## 2016-06-24 ENCOUNTER — Telehealth: Payer: Self-pay | Admitting: Family Medicine

## 2016-06-24 NOTE — Telephone Encounter (Signed)
See other message, pt scheduled tomorrow to see Dr.Kim.

## 2016-06-24 NOTE — Telephone Encounter (Signed)
FYI, pt coming tomorrow.

## 2016-06-24 NOTE — Telephone Encounter (Signed)
[facility] - Client [facility] Patient Name: Rachel Fields DOB: Jan 10, 1956 Initial Comment Caller states, She has high blood pressure - 180/100 - maybe a new rx. Nurse Assessment Nurse: Dimas Chyle, RN, Dellis Filbert Date/Time Eilene Ghazi Time): 06/24/2016 4:19:32 PM Confirm and document reason for call. If symptomatic, describe symptoms. You must click the next button to save text entered. ---Caller states, she has high blood pressure - 180/100 - maybe a new rx. Symptoms started yesterday. Has the patient traveled out of the country within the last 30 days? ---No Does the patient have any new or worsening symptoms? ---Yes Will a triage be completed? ---Yes Related visit to physician within the last 2 weeks? ---No Does the PT have any chronic conditions? (i.e. diabetes, asthma, etc.) ---Yes List chronic conditions. ---HTN Is this a behavioral health or substance abuse call? ---No Guidelines Guideline Title Affirmed Question Affirmed Notes High Blood Pressure BP # 180/110 Final Disposition User See Physician within 24 Hours Dimas Chyle, RN, [employer] Referrals REFERRED TO PCP OFFICE Disagree/Comply: Leta Baptist

## 2016-06-24 NOTE — Telephone Encounter (Signed)
Spoke to pt, blood pressure has been elevated all night and is concerned, thinks medication is not working. Told pt needs to be evaluated can she come in tomorrow to see Dr.Kim? Pt said yes. Appointment scheduled for tomorrow at !:00 PM to see Dr.Kim. Pt verbalized understanding.

## 2016-06-24 NOTE — Telephone Encounter (Signed)
[facility] - Client [place] [facility] Patient Name: Rachel Fields DOB: 1956-09-23 Initial Comment Caller states bp 180/100, has been monitoring it all night at work Nurse Assessment Guidelines Guideline Title Affirmed Question Affirmed Notes Final Disposition User FINAL ATTEMPT MADE - message left [place], Therapist, sports, [employer]

## 2016-06-25 ENCOUNTER — Encounter: Payer: Self-pay | Admitting: Gastroenterology

## 2016-06-25 ENCOUNTER — Encounter: Payer: Self-pay | Admitting: Family Medicine

## 2016-06-25 ENCOUNTER — Encounter: Payer: Self-pay | Admitting: *Deleted

## 2016-06-25 ENCOUNTER — Ambulatory Visit (INDEPENDENT_AMBULATORY_CARE_PROVIDER_SITE_OTHER): Payer: 59 | Admitting: Family Medicine

## 2016-06-25 ENCOUNTER — Ambulatory Visit: Payer: 59 | Admitting: Family Medicine

## 2016-06-25 VITALS — BP 150/96 | HR 90 | Temp 97.7°F | Ht 66.0 in | Wt 202.7 lb

## 2016-06-25 DIAGNOSIS — Z23 Encounter for immunization: Secondary | ICD-10-CM | POA: Diagnosis not present

## 2016-06-25 DIAGNOSIS — Z1211 Encounter for screening for malignant neoplasm of colon: Secondary | ICD-10-CM

## 2016-06-25 DIAGNOSIS — I10 Essential (primary) hypertension: Secondary | ICD-10-CM | POA: Diagnosis not present

## 2016-06-25 DIAGNOSIS — M549 Dorsalgia, unspecified: Secondary | ICD-10-CM

## 2016-06-25 MED ORDER — AMLODIPINE BESYLATE 5 MG PO TABS: 5.0000 mg | ORAL_TABLET | Freq: Every day | ORAL | 3 refills | Status: DC

## 2016-06-25 MED FILL — AMLODIPINE BESYLATE 5 MG TA: 5 | 90 days supply | Qty: 90 | Fill #0

## 2016-06-25 NOTE — Progress Notes (Signed)
HPI:  Acute visit for:  HTN: -meds lisinopril -poor compliance with follow up in the past - advised 1 month f/u after last visit, has been 4 months -reports: elevated on home cuff recently, reports feels fine -denies:CP, HA, SOB -hyperglycemia and hyperlipidemia last labs  Wants to do colonosocpy  ROS: See pertinent positives and negatives per HPI.  Past Medical History:  Diagnosis Date  . Anxiety and depression   . HTN (hypertension)   . Hx of iron deficiency anemia    reports [place] trait; reports on iron her whole life  . Low back pain    chronic, reports hx DDD treated by chiropractor  . No blood products    Jehovah's Witness  . OAB (overactive bladder)   . Seasonal allergies   . Uterine fibroid     Past Surgical History:  Procedure Laterality Date  . CESAREAN SECTION    . FOOT SURGERY    . TUBAL LIGATION    . TUMOR REMOVAL     behind left eye    Family History  Problem Relation Age of Onset  . Diabetes Father   . Hypertension Father   . Dementia Father     senile  . Bipolar disorder Sister   . Schizophrenia Sister   . Schizophrenia Sister   . Bipolar disorder Sister   . Schizophrenia Sister   . Bipolar disorder Sister   . Other Mother     smoker  . Lung cancer Mother     2010  . Bipolar disorder Daughter     Social History   Social History  . Marital status: Single    Spouse name: N/A  . Number of children: N/A  . Years of education: N/A   Social History Main Topics  . Smoking status: Never Smoker  . Smokeless tobacco: None  . Alcohol use Yes     Comment: socially - rare; 2 drinks a few times per month  . Drug use: No  . Sexual activity: Not Asked   Other Topics Concern  . None   Social History Narrative   Work or School: Quarry manager, working on Paediatric nurse Situation: Lives with husband - married in 2015; hx of homelessness in 2015      Spiritual Beliefs: Jehovah's Witness      Lifestyle: no regular CV exercise; diet improving -  reports she has been able to loose weight                       Current Outpatient Prescriptions:  .  calcium-vitamin D (CALCIUM + D) 250-125 MG-UNIT per tablet, Take 1 tablet by mouth daily., Disp: 30 tablet, Rfl: 3 .  cyclobenzaprine (FLEXERIL) 5 MG tablet, Take 1 tablet (5 mg total) by mouth at bedtime., Disp: 30 tablet, Rfl: 0 .  Fe Fum-FA-B Cmp-C-Zn-Mg-Mn-Cu (HEMOCYTE PLUS) 106-1 MG CAPS, Take 1 capsule by mouth daily., Disp: 30 each, Rfl: 3 .  fluticasone (FLONASE) 50 MCG/ACT nasal spray, Place 1 spray into both nostrils at bedtime., Disp: 16 g, Rfl: 3 .  fluticasone (FLONASE) 50 MCG/ACT nasal spray, Place 2 sprays into both nostrils daily. For 1 month, then 1 spray thereafter, Disp: 16 g, Rfl: 6 .  lisinopril (PRINIVIL,ZESTRIL) 10 MG tablet, Take 1 tablet (10 mg total) by mouth daily., Disp: 90 tablet, Rfl: 3 .  naproxen (NAPROSYN) 500 MG tablet, Take 1 tablet (500 mg total) by mouth 2 (two) times daily with a meal.,  Disp: 30 tablet, Rfl: 0 .  OVER THE COUNTER MEDICATION, Take 2 tablets by mouth daily. Biotin 5044mcg, Disp: , Rfl:  .  tolterodine (DETROL LA) 4 MG 24 hr capsule, Take 1 capsule (4 mg total) by mouth daily., Disp: 90 capsule, Rfl: 3 .  venlafaxine XR (EFFEXOR-XR) 75 MG 24 hr capsule, Take 3 capsules by mouth daily with breakfast, Disp: 270 capsule, Rfl: 3 .  amLODipine (NORVASC) 5 MG tablet, Take 1 tablet (5 mg total) by mouth daily., Disp: 90 tablet, Rfl: 3  EXAM:  Vitals:   06/25/16 1036  BP: (!) 150/96  Pulse: 90  Temp: 97.7 F (36.5 C)    Body mass index is 32.72 kg/m.  GENERAL: vitals reviewed and listed above, alert, oriented, appears well hydrated and in no acute distress  HEENT: atraumatic, conjunttiva clear, no obvious abnormalities on inspection of external nose and ears  NECK: no obvious masses on inspection  LUNGS: clear to auscultation bilaterally, no wheezes, rales or rhonchi, good air movement  CV: HRRR, no peripheral edema  MS: moves  all extremities without noticeable abnormality  PSYCH: pleasant and cooperative, no obvious depression or anxiety  ASSESSMENT AND PLAN:  Discussed the following assessment and plan:  HYPERTENSION, BENIGN -discussed options and risks, she decided to add norvasc, monitor and increase lisinopril if needed -follow up in 1 month, labs then -advised on how to check home BP  Screening for colon cancer - Plan: Ambulatory referral to Gastroenterology -discussed options for colon ca screening risks/benefits - she wants to do colonoscopy - referral placed  Side note: discussed options for chronic back pain, advised againt opiods/narcotics. Advised comprehensive PT/exercise, stress management, heat, topical products, tylenol - aleve - risks and advised alt/conservative use  -Patient advised to return or notify a doctor immediately if symptoms worsen or persist or new concerns arise.  Patient Instructions  BEFORE YOU LEAVE: -follow up: in 1 month - morning if possible and fasting if possible to recheck labs  Start the norvasc and take daily in the morning.  Monitor blood pressure at home. Sit for 5 minutes. Take 3 blood pressure readings 1 minute apart with arm cuff and record average. If still elevated in 1 week, increase lisinopril to 20mg  daily.   We recommend the following healthy lifestyle for LIFE: 1) Small portions.   Tip: eat off of a salad plate instead of a dinner plate.  Tip: It is ok to feel hungry after a meal - that likely means you ate an appropriate portion.  Tip: if you need more or a snack choose fruits, veggies and/or a handful of nuts or seeds.  2) Eat a healthy clean diet.  * Tip: Avoid (less then 1 serving per week): processed foods, sweets, sweetened drinks, white starches (rice, flour, bread, potatoes, pasta, etc), red meat, fast foods, butter  *Tip: CHOOSE instead   * 5-9 servings per day of fresh or frozen fruits and vegetables (but not corn, potatoes, bananas,  canned or dried fruit)   *nuts and seeds, beans   *olives and olive oil   *small portions of lean meats such as fish and white chicken    *small portions of whole grains  3)Get at least 150 minutes of sweaty aerobic exercise per week.  4)Reduce stress - consider counseling, meditation and relaxation to balance other aspects of your life.     Colin Benton R., DO

## 2016-06-25 NOTE — Progress Notes (Signed)
Pre visit review using our clinic review tool, if applicable. No additional management support is needed unless otherwise documented below in the visit note. 

## 2016-06-25 NOTE — Patient Instructions (Signed)
BEFORE YOU LEAVE: -follow up: in 1 month - morning if possible and fasting if possible to recheck labs  Start the norvasc and take daily in the morning.  Monitor blood pressure at home. Sit for 5 minutes. Take 3 blood pressure readings 1 minute apart with arm cuff and record average. If still elevated in 1 week, increase lisinopril to 20mg  daily.   We recommend the following healthy lifestyle for LIFE: 1) Small portions.   Tip: eat off of a salad plate instead of a dinner plate.  Tip: It is ok to feel hungry after a meal - that likely means you ate an appropriate portion.  Tip: if you need more or a snack choose fruits, veggies and/or a handful of nuts or seeds.  2) Eat a healthy clean diet.  * Tip: Avoid (less then 1 serving per week): processed foods, sweets, sweetened drinks, white starches (rice, flour, bread, potatoes, pasta, etc), red meat, fast foods, butter  *Tip: CHOOSE instead   * 5-9 servings per day of fresh or frozen fruits and vegetables (but not corn, potatoes, bananas, canned or dried fruit)   *nuts and seeds, beans   *olives and olive oil   *small portions of lean meats such as fish and white chicken    *small portions of whole grains  3)Get at least 150 minutes of sweaty aerobic exercise per week.  4)Reduce stress - consider counseling, meditation and relaxation to balance other aspects of your life.

## 2016-07-28 NOTE — Progress Notes (Deleted)
HPI:  Follow up:  HTN: -meds lisinopril, added norvasc last visit and advised increase in lisinopril as needed per home monitoring -poor compliance with follow up in the past  -reports:  -denies:CP, HA, SOB -hyperglycemia and hyperlipidemia last labs  Obesity: -wt last visit 202; healthy diet and regular aerobic exercise advised -reports:  Prediabetes/HLD: -lifestyle recs advised -reports: see above -denies:   ROS: See pertinent positives and negatives per HPI.  Past Medical History:  Diagnosis Date  . Anxiety and depression   . HTN (hypertension)   . Hx of iron deficiency anemia    reports [place] trait; reports on iron her whole life  . Low back pain    chronic, reports hx DDD treated by chiropractor  . No blood products    Jehovah's Witness  . OAB (overactive bladder)   . Seasonal allergies   . Uterine fibroid     Past Surgical History:  Procedure Laterality Date  . CESAREAN SECTION    . FOOT SURGERY    . TUBAL LIGATION    . TUMOR REMOVAL     behind left eye    Family History  Problem Relation Age of Onset  . Diabetes Father   . Hypertension Father   . Dementia Father     senile  . Bipolar disorder Sister   . Schizophrenia Sister   . Schizophrenia Sister   . Bipolar disorder Sister   . Schizophrenia Sister   . Bipolar disorder Sister   . Other Mother     smoker  . Lung cancer Mother     2010  . Bipolar disorder Daughter     Social History   Social History  . Marital status: Single    Spouse name: N/A  . Number of children: N/A  . Years of education: N/A   Social History Main Topics  . Smoking status: Never Smoker  . Smokeless tobacco: Not on file  . Alcohol use Yes     Comment: socially - rare; 2 drinks a few times per month  . Drug use: No  . Sexual activity: Not on file   Other Topics Concern  . Not on file   Social History Narrative   Work or School: CNA, working on Pensions consultant      Home Situation: Lives with husband - married  in 2015; hx of homelessness in 2015      Spiritual Beliefs: Jehovah's Witness      Lifestyle: no regular CV exercise; diet improving - reports she has been able to loose weight                       Current Outpatient Prescriptions:  .  amLODipine (NORVASC) 5 MG tablet, Take 1 tablet (5 mg total) by mouth daily., Disp: 90 tablet, Rfl: 3 .  calcium-vitamin D (CALCIUM + D) 250-125 MG-UNIT per tablet, Take 1 tablet by mouth daily., Disp: 30 tablet, Rfl: 3 .  cyclobenzaprine (FLEXERIL) 5 MG tablet, Take 1 tablet (5 mg total) by mouth at bedtime., Disp: 30 tablet, Rfl: 0 .  Fe Fum-FA-B Cmp-C-Zn-Mg-Mn-Cu (HEMOCYTE PLUS) 106-1 MG CAPS, Take 1 capsule by mouth daily., Disp: 30 each, Rfl: 3 .  fluticasone (FLONASE) 50 MCG/ACT nasal spray, Place 1 spray into both nostrils at bedtime., Disp: 16 g, Rfl: 3 .  fluticasone (FLONASE) 50 MCG/ACT nasal spray, Place 2 sprays into both nostrils daily. For 1 month, then 1 spray thereafter, Disp: 16 g, Rfl: 6 .  lisinopril (  PRINIVIL,ZESTRIL) 10 MG tablet, Take 1 tablet (10 mg total) by mouth daily., Disp: 90 tablet, Rfl: 3 .  naproxen (NAPROSYN) 500 MG tablet, Take 1 tablet (500 mg total) by mouth 2 (two) times daily with a meal., Disp: 30 tablet, Rfl: 0 .  OVER THE COUNTER MEDICATION, Take 2 tablets by mouth daily. Biotin 5086mcg, Disp: , Rfl:  .  tolterodine (DETROL LA) 4 MG 24 hr capsule, Take 1 capsule (4 mg total) by mouth daily., Disp: 90 capsule, Rfl: 3 .  venlafaxine XR (EFFEXOR-XR) 75 MG 24 hr capsule, Take 3 capsules by mouth daily with breakfast, Disp: 270 capsule, Rfl: 3  EXAM:  There were no vitals filed for this visit.  There is no height or weight on file to calculate BMI.  GENERAL: vitals reviewed and listed above, alert, oriented, appears well hydrated and in no acute distress  HEENT: atraumatic, conjunttiva clear, no obvious abnormalities on inspection of external nose and ears  NECK: no obvious masses on inspection  LUNGS: clear  to auscultation bilaterally, no wheezes, rales or rhonchi, good air movement  CV: HRRR, no peripheral edema  MS: moves all extremities without noticeable abnormality  PSYCH: pleasant and cooperative, no obvious depression or anxiety  ASSESSMENT AND PLAN:  Discussed the following assessment and plan:  No diagnosis found. -labs -lifestyle recs -implications of current metabolic disturbances and role of unhealthy lifestyle and potential risks/progression if changes not initiated discussed -Patient advised to return or notify a doctor immediately if symptoms worsen or persist or new concerns arise.  There are no Patient Instructions on file for this visit.  Colin Benton R., DO

## 2016-07-29 ENCOUNTER — Telehealth: Payer: Self-pay | Admitting: *Deleted

## 2016-07-29 ENCOUNTER — Ambulatory Visit: Payer: 59 | Admitting: Family Medicine

## 2016-07-29 DIAGNOSIS — Z0289 Encounter for other administrative examinations: Secondary | ICD-10-CM

## 2016-07-29 NOTE — Telephone Encounter (Signed)
Patient was a no-show for today's appt.  I called to check on her and she stated she was seen here today by someone since she was sick and rescheduled the appt.

## 2016-08-25 ENCOUNTER — Encounter: Payer: 59 | Admitting: Gastroenterology

## 2016-08-25 NOTE — Progress Notes (Deleted)
HPI:  Madison Herzog is a pleasant 60 yo with a PMH sig for HTN, chronic back pain, HLD, hyperglycemia, obesity, poor compliance, anxiety and depression here for follow up. Added norvasc last visit and advised to titrate lisinopril if needed. Due for labs. Reports. Deneis.  ROS: See pertinent positives and negatives per HPI.  Past Medical History:  Diagnosis Date  . Anxiety and depression   . HTN (hypertension)   . Hx of iron deficiency anemia    reports [place] trait; reports on iron her whole life  . Low back pain    chronic, reports hx DDD treated by chiropractor  . No blood products    Jehovah's Witness  . OAB (overactive bladder)   . Seasonal allergies   . Uterine fibroid     Past Surgical History:  Procedure Laterality Date  . CESAREAN SECTION    . FOOT SURGERY    . TUBAL LIGATION    . TUMOR REMOVAL     behind left eye    Family History  Problem Relation Age of Onset  . Diabetes Father   . Hypertension Father   . Dementia Father     senile  . Bipolar disorder Sister   . Schizophrenia Sister   . Schizophrenia Sister   . Bipolar disorder Sister   . Schizophrenia Sister   . Bipolar disorder Sister   . Other Mother     smoker  . Lung cancer Mother     2010  . Bipolar disorder Daughter     Social History   Social History  . Marital status: Single    Spouse name: N/A  . Number of children: N/A  . Years of education: N/A   Social History Main Topics  . Smoking status: Never Smoker  . Smokeless tobacco: Not on file  . Alcohol use Yes     Comment: socially - rare; 2 drinks a few times per month  . Drug use: No  . Sexual activity: Not on file   Other Topics Concern  . Not on file   Social History Narrative   Work or School: CNA, working on Pensions consultant      Home Situation: Lives with husband - married in 2015; hx of homelessness in 2015      Spiritual Beliefs: Jehovah's Witness      Lifestyle: no regular CV exercise; diet improving - reports she has been  able to loose weight                       Current Outpatient Prescriptions:  .  amLODipine (NORVASC) 5 MG tablet, Take 1 tablet (5 mg total) by mouth daily., Disp: 90 tablet, Rfl: 3 .  calcium-vitamin D (CALCIUM + D) 250-125 MG-UNIT per tablet, Take 1 tablet by mouth daily., Disp: 30 tablet, Rfl: 3 .  cyclobenzaprine (FLEXERIL) 5 MG tablet, Take 1 tablet (5 mg total) by mouth at bedtime., Disp: 30 tablet, Rfl: 0 .  Fe Fum-FA-B Cmp-C-Zn-Mg-Mn-Cu (HEMOCYTE PLUS) 106-1 MG CAPS, Take 1 capsule by mouth daily., Disp: 30 each, Rfl: 3 .  fluticasone (FLONASE) 50 MCG/ACT nasal spray, Place 1 spray into both nostrils at bedtime., Disp: 16 g, Rfl: 3 .  fluticasone (FLONASE) 50 MCG/ACT nasal spray, Place 2 sprays into both nostrils daily. For 1 month, then 1 spray thereafter, Disp: 16 g, Rfl: 6 .  lisinopril (PRINIVIL,ZESTRIL) 10 MG tablet, Take 1 tablet (10 mg total) by mouth daily., Disp: 90 tablet, Rfl: 3 .  naproxen (NAPROSYN) 500 MG tablet, Take 1 tablet (500 mg total) by mouth 2 (two) times daily with a meal., Disp: 30 tablet, Rfl: 0 .  OVER THE COUNTER MEDICATION, Take 2 tablets by mouth daily. Biotin 5010mcg, Disp: , Rfl:  .  tolterodine (DETROL LA) 4 MG 24 hr capsule, Take 1 capsule (4 mg total) by mouth daily., Disp: 90 capsule, Rfl: 3 .  venlafaxine XR (EFFEXOR-XR) 75 MG 24 hr capsule, Take 3 capsules by mouth daily with breakfast, Disp: 270 capsule, Rfl: 3  EXAM:  There were no vitals filed for this visit.  There is no height or weight on file to calculate BMI.  GENERAL: vitals reviewed and listed above, alert, oriented, appears well hydrated and in no acute distress  HEENT: atraumatic, conjunttiva clear, no obvious abnormalities on inspection of external nose and ears  NECK: no obvious masses on inspection  LUNGS: clear to auscultation bilaterally, no wheezes, rales or rhonchi, good air movement  CV: HRRR, no peripheral edema  MS: moves all extremities without noticeable  abnormality  PSYCH: pleasant and cooperative, no obvious depression or anxiety  ASSESSMENT AND PLAN:  Discussed the following assessment and plan:  No diagnosis found.  -Patient advised to return or notify a doctor immediately if symptoms worsen or persist or new concerns arise.  There are no Patient Instructions on file for this visit.  Colin Benton R., DO

## 2016-08-26 ENCOUNTER — Ambulatory Visit: Payer: 59 | Admitting: Family Medicine

## 2016-08-26 ENCOUNTER — Telehealth: Payer: Self-pay | Admitting: *Deleted

## 2016-08-26 DIAGNOSIS — Z0289 Encounter for other administrative examinations: Secondary | ICD-10-CM

## 2016-08-26 NOTE — Telephone Encounter (Signed)
Patient was a no-show for today's visit.  I left a detailed message at her home number to check on her and asked that she call back to reschedule.

## 2016-08-27 ENCOUNTER — Emergency Department (HOSPITAL_COMMUNITY)
Admission: EM | Admit: 2016-08-27 | Discharge: 2016-08-28 | Disposition: A | Discharge status: Home / Self Care | Payer: 59 | Attending: Emergency Medicine | Admitting: Emergency Medicine

## 2016-08-27 ENCOUNTER — Encounter (HOSPITAL_COMMUNITY): Payer: Self-pay | Admitting: Nurse Practitioner

## 2016-08-27 ENCOUNTER — Emergency Department (HOSPITAL_COMMUNITY): Payer: 59

## 2016-08-27 DIAGNOSIS — Z79899 Other long term (current) drug therapy: Secondary | ICD-10-CM | POA: Insufficient documentation

## 2016-08-27 DIAGNOSIS — R071 Chest pain on breathing: Secondary | ICD-10-CM | POA: Diagnosis not present

## 2016-08-27 DIAGNOSIS — J189 Pneumonia, unspecified organism: Secondary | ICD-10-CM | POA: Diagnosis not present

## 2016-08-27 DIAGNOSIS — I10 Essential (primary) hypertension: Secondary | ICD-10-CM | POA: Insufficient documentation

## 2016-08-27 DIAGNOSIS — R05 Cough: Secondary | ICD-10-CM | POA: Diagnosis not present

## 2016-08-27 DIAGNOSIS — R079 Chest pain, unspecified: Secondary | ICD-10-CM | POA: Diagnosis not present

## 2016-08-27 LAB — CBC
HCT: 31.4 % — ABNORMAL LOW (ref 36.0–46.0)
Hemoglobin: 10.5 g/dL — ABNORMAL LOW (ref 12.0–15.0)
MCH: 25.8 pg — ABNORMAL LOW (ref 26.0–34.0)
MCHC: 33.4 g/dL (ref 30.0–36.0)
MCV: 77.1 fL — ABNORMAL LOW (ref 78.0–100.0)
Platelets: 274 10*3/uL (ref 150–400)
RBC: 4.07 MIL/uL (ref 3.87–5.11)
RDW: 13.8 % (ref 11.5–15.5)
WBC: 15.2 10*3/uL — ABNORMAL HIGH (ref 4.0–10.5)

## 2016-08-27 LAB — BASIC METABOLIC PANEL
Anion gap: 8 (ref 5–15)
BUN: 10 mg/dL (ref 6–20)
CO2: 25 mmol/L (ref 22–32)
Calcium: 8.9 mg/dL (ref 8.9–10.3)
Chloride: 104 mmol/L (ref 101–111)
Creatinine, Ser: 0.95 mg/dL (ref 0.44–1.00)
GFR calc Af Amer: >60 mL/min (ref >60)
GFR calc non Af Amer: >60 mL/min (ref >60)
Glucose, Bld: 154 mg/dL — ABNORMAL HIGH (ref 65–99)
Potassium: 3.6 mmol/L (ref 3.5–5.1)
Sodium: 137 mmol/L (ref 135–145)

## 2016-08-27 LAB — I-STAT TROPONIN, ED: Troponin i, poc: 0.01 ng/mL (ref 0.00–0.08)

## 2016-08-27 MED ORDER — MORPHINE SULFATE (PF) 4 MG/ML IV SOLN
4.0000 mg | Freq: Once | INTRAVENOUS | Status: AC
  Administered 2016-08-27: 4 mg via INTRAVENOUS
  Filled 2016-08-27: qty 1

## 2016-08-27 MED ORDER — ASPIRIN 81 MG PO CHEW
324.0000 mg | CHEWABLE_TABLET | Freq: Once | ORAL | Status: AC
  Administered 2016-08-27: 324 mg via ORAL
  Filled 2016-08-27: qty 4

## 2016-08-27 MED ORDER — KETOROLAC TROMETHAMINE 30 MG/ML IJ SOLN
30.0000 mg | Freq: Once | INTRAMUSCULAR | Status: AC
  Administered 2016-08-27: 30 mg via INTRAVENOUS
  Filled 2016-08-27: qty 1

## 2016-08-27 MED ORDER — LEVOFLOXACIN IN D5W 750 MG/150ML IV SOLN
750.0000 mg | Freq: Once | INTRAVENOUS | Status: AC
  Administered 2016-08-27: 750 mg via INTRAVENOUS
  Filled 2016-08-27: qty 150

## 2016-08-27 MED ORDER — ONDANSETRON HCL 4 MG/2ML IJ SOLN
4.0000 mg | Freq: Once | INTRAMUSCULAR | Status: AC
  Administered 2016-08-27: 4 mg via INTRAVENOUS
  Filled 2016-08-27: qty 2

## 2016-08-27 MED ORDER — SODIUM CHLORIDE 0.9 % IV BOLUS (SEPSIS)
1000.0000 mL | Freq: Once | INTRAVENOUS | Status: AC
  Administered 2016-08-27: 1000 mL via INTRAVENOUS

## 2016-08-27 NOTE — ED Provider Notes (Signed)
[name] Provider Note   CSN: [id number] Arrival date & time: 08/27/16  1940  By signing my name below, I, [name], attest that this documentation has been prepared under the direction and in the presence of [employer], PA-C. Electronically Signed: [name], ED Scribe. 08/27/16. 8:32 PM.  History   Chief Complaint Chief Complaint  Patient presents with  . Chest Pain  The history is provided by the patient. No language interpreter was used.   HPI Comments: Rachel Fields is a 60 y.o. Female with a hx of HTN who presents to the Emergency Department complaining of stabbing, left sided chest pain that radiates to the back onset one day ago. She currently rates her pain 10/10. Pt reports chills, subjective fever, cough, and elevated BP of 191/101. She says that her BP is typically [id number] systolic. Pt's pain is worsened by deep breathing and coughing. 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 She has taken ibuprofen for her pain to relief. She says that her father had his heart attack at age 41. She denies recent surgeries, recent hospitalizations, leg swelling, nausea or vomiting.   Past Medical History:  Diagnosis Date  . Anxiety and depression   . HTN (hypertension)   . Hx of iron deficiency anemia    reports [place] trait; reports on iron her whole life  . Low back pain    chronic, reports hx DDD treated by chiropractor  . No blood products    Jehovah's Witness  . OAB (overactive bladder)   . Seasonal allergies   . Uterine fibroid     Patient Active Problem List   Diagnosis Date Noted  . Seasonal allergies 01/21/2015  . OAB (overactive bladder) 01/21/2015  . Dyslipidemia 02/14/2014  . Obesity 12/19/2009  . LOW BACK PAIN 03/18/2008  . DEPRESSION/ANXIETY 06/15/2007  . HYPERTENSION, BENIGN 06/15/2007    Past Surgical History:  Procedure Laterality Date  . CESAREAN SECTION    . FOOT SURGERY    . TUBAL LIGATION    . TUMOR REMOVAL     behind left eye    OB History    No data available       Home Medications    Prior to Admission medications   Medication Sig Start Date End Date Taking? Authorizing Provider  amLODipine (NORVASC) 5 MG tablet Take 1 tablet (5 mg total) by mouth daily. 06/25/16  Yes Lucretia Kern, DO  cyclobenzaprine (FLEXERIL) 5 MG tablet Take 1 tablet (5 mg total) by mouth at bedtime. Patient taking differently: Take 5 mg by mouth daily.  02/26/16  Yes Lucretia Kern, DO  Fe Fum-FA-B Cmp-C-Zn-Mg-Mn-Cu (HEMOCYTE PLUS) 106-1 MG CAPS Take 1 capsule by mouth daily. 02/14/14  Yes Robbie Lis, MD  ibuprofen (ADVIL,MOTRIN) 200 MG tablet Take 1,000 mg by mouth every 6 (six) hours as needed for moderate pain.   Yes Historical Provider, MD  lisinopril (PRINIVIL,ZESTRIL) 10 MG tablet Take 1 tablet (10 mg total) by mouth daily. 02/26/16  Yes Lucretia Kern, DO  venlafaxine XR (EFFEXOR-XR) 75 MG 24 hr capsule Take 3 capsules by mouth daily with breakfast 02/26/16  Yes Lucretia Kern, DO  benzonatate (TESSALON) 100 MG capsule Take 1 capsule (100 mg total) by mouth every 8 (eight) hours. 08/28/16   Antonietta Breach, PA-C  calcium-vitamin D (CALCIUM + D) 250-125 MG-UNIT per tablet Take 1 tablet by mouth daily. Patient not taking: Reported on 08/27/2016 02/14/14   Robbie Lis, MD  fluticasone (FLONASE) 50 MCG/ACT nasal spray Place 1 spray into both nostrils at bedtime. Patient taking differently: Place 1 spray into both nostrils at bedtime as needed for allergies.  02/26/16   Lucretia Kern, DO  fluticasone (FLONASE) 50 MCG/ACT nasal spray Place 2 sprays into both nostrils daily. For 1 month, then 1 spray thereafter Patient not taking: Reported on 08/27/2016 03/11/16   Lucretia Kern, DO  levofloxacin (LEVAQUIN) 750 MG tablet Take 1 tablet (750 mg total) by mouth daily. Start on the evening of 08/28/16. Take until finished. 08/28/16   Antonietta Breach, PA-C  naproxen (NAPROSYN) 500 MG tablet Take 1 tablet (500 mg total) by mouth 2 (two) times daily  with a meal. Patient not taking: Reported on 08/27/2016 11/01/15   Delsa Grana, PA-C  OVER THE COUNTER MEDICATION Take 2 tablets by mouth daily. Biotin 5012mcg    Historical Provider, MD  oxyCODONE-acetaminophen (PERCOCET/ROXICET) 5-325 MG tablet Take 1-2 tablets by mouth every 6 (six) hours as needed for severe pain. 08/28/16   Antonietta Breach, PA-C  tolterodine (DETROL LA) 4 MG 24 hr capsule Take 1 capsule (4 mg total) by mouth daily. Patient not taking: Reported on 08/27/2016 02/26/16   Lucretia Kern, DO    Family History Family History  Problem Relation Age of Onset  . Diabetes Father   . Hypertension Father   . Dementia Father     senile  . Other Mother     smoker  . Lung cancer Mother     2010  . Bipolar disorder Sister   . Schizophrenia Sister   . Schizophrenia Sister   . Bipolar disorder Sister   . Schizophrenia Sister   . Bipolar disorder Sister   . Bipolar disorder Daughter     Social History Social History  Substance Use Topics  . Smoking status: Never Smoker  . Smokeless tobacco: Never Used  . Alcohol use Yes     Comment: socially - rare; 2 drinks a few times per month     Allergies   Patient has no known allergies.   Review of Systems Review of Systems  Ten systems reviewed and are negative for acute change, except as noted in the HPI.   Physical Exam Updated Vital Signs BP 135/78 (BP Location: Left Arm)   Pulse 88   Temp 98.1 F (36.7 C) (Oral)   Resp 17   Ht 5\' 7"  (1.702 m)   Wt 90.7 kg   SpO2 97%   BMI 31.32 kg/m   Physical Exam  Constitutional: She is oriented to person, place, and time. She appears well-developed and well-nourished. No distress.  Nontoxic appearing and in NAD  HENT:  Head: Normocephalic and atraumatic.  Eyes: Conjunctivae and EOM are normal. No scleral icterus.  Neck: Normal range of motion.  Cardiovascular: Normal heart sounds and intact distal pulses.   Mild tachycardia to 104bpm during encounter  Pulmonary/Chest:  Effort normal. No respiratory distress. She has no wheezes. She has no rales.  Respirations even and unlabored. Lungs CTAB.  Musculoskeletal: Normal range of motion.  No pitting edema in BLE  Neurological: She is alert and oriented to person, place, and time. She exhibits normal muscle tone. Coordination normal.  GCS 15. Patient moving all extremities.  Skin: Skin is warm and dry. No rash noted. She is not diaphoretic. No erythema. No pallor.  Psychiatric: She has a normal mood and affect. Her behavior is normal.  Nursing note and vitals reviewed.    ED Treatments /  Results  DIAGNOSTIC STUDIES: Oxygen Saturation is 99% on RA, normal by my interpretation.    COORDINATION OF CARE: 8:22 PM-Discussed treatment plan which includes labs, EKG, and x-ray with pt at bedside and pt agreed to plan.    Labs (all labs ordered are listed, but only abnormal results are displayed) Labs Reviewed  BASIC METABOLIC PANEL - Abnormal; Notable for the following:       Result Value   Glucose, Bld 154 (*)    All other components within normal limits  CBC - Abnormal; Notable for the following:    WBC 15.2 (*)    Hemoglobin 10.5 (*)    HCT 31.4 (*)    MCV 77.1 (*)    MCH 25.8 (*)    All other components within normal limits  I-STAT TROPOININ, ED    EKG  EKG Interpretation  Date/Time:  Friday August 27 2016 20:07:23 EST Ventricular Rate:  102 PR Interval:    QRS Duration: 77 QT Interval:  324 QTC Calculation: 422 R Axis:   46 Text Interpretation:  Sinus tachycardia Probable left atrial enlargement Abnormal R-wave progression, early transition Probable LVH with secondary repol abnrm Baseline wander in lead(s) V6 Compared to previous. no significant chance since last tracing Confirmed by [facility] MD, CAMERON [id number]) on 08/27/2016 9:28:52 PM       Radiology Dg Chest 2 View  Result Date: 08/27/2016 CLINICAL DATA:  Chest pain.  Chills, fever, cough. EXAM: CHEST  2 VIEW COMPARISON:  11/01/2015  FINDINGS: Airspace opacity in the lingula compatible with pneumonia. Right lung is clear. Heart is normal size. No effusions or acute bony abnormality. IMPRESSION: Lingular pneumonia. Followup PA and lateral chest X-ray is recommended in 3-4 weeks following trial of antibiotic therapy to ensure resolution and exclude underlying malignancy. Electronically Signed   By: Rolm Baptise M.D.   On: 08/27/2016 20:37    Procedures Procedures (including critical care time)  Medications Ordered in ED Medications  morphine 4 MG/ML injection 4 mg (4 mg Intravenous Given 08/27/16 2046)  ondansetron (ZOFRAN) injection 4 mg (4 mg Intravenous Given 08/27/16 2046)  aspirin chewable tablet 324 mg (324 mg Oral Given 08/27/16 2054)  ketorolac (TORADOL) 30 MG/ML injection 30 mg (30 mg Intravenous Given 08/27/16 2210)  sodium chloride 0.9 % bolus 1,000 mL (0 mLs Intravenous Stopped 08/27/16 2300)  levofloxacin (LEVAQUIN) IVPB 750 mg (0 mg Intravenous Stopped 08/27/16 2337)  albuterol (PROVENTIL HFA;VENTOLIN HFA) 108 (90 Base) MCG/ACT inhaler 2 puff (2 puffs Inhalation Given 08/28/16 0049)     Initial Impression / Assessment and Plan / ED Course  I have reviewed the triage vital signs and the nursing notes.  Pertinent labs & imaging results that were available during my care of the patient were reviewed by me and considered in my medical decision making (see chart for details).  Clinical Course     60 year old female presents to the emergency department for evaluation of left-sided pleuritic chest pain. Location of pain correlates with evidence of lingular pneumonia seen on chest x-ray. Patient treated with Levaquin in the emergency department. She has no fever, persistent tachycardia, tachypnea, or hypotension to suggest SIRS or SEPSIS. CURB-65 score is 0, correlating with low risk of mortality with outpatient treatment. She is ambulatory in the emergency department without hypoxia. Plan to manage supportively  with additional 4 day course of Levaquin, tessalon, incentive spirometry, and albuterol as needed. Patient advised to follow-up with her primary care doctor to ensure resolution of symptoms. Return precautions discussed and  provided. Patient discharged in stable condition with no unaddressed concerns.   Final Clinical Impressions(s) / ED Diagnoses   Final diagnoses:  HCAP (healthcare-associated pneumonia)    New Prescriptions New Prescriptions   BENZONATATE (TESSALON) 100 MG CAPSULE    Take 1 capsule (100 mg total) by mouth every 8 (eight) hours.   LEVOFLOXACIN (LEVAQUIN) 750 MG TABLET    Take 1 tablet (750 mg total) by mouth daily. Start on the evening of 08/28/16. Take until finished.   OXYCODONE-ACETAMINOPHEN (PERCOCET/ROXICET) 5-325 MG TABLET    Take 1-2 tablets by mouth every 6 (six) hours as needed for severe pain.    I personally performed the services described in this documentation, which was scribed in my presence. The recorded information has been reviewed and is accurate.      Antonietta Breach, PA-C 08/28/16 0104    Duffy Bruce, MD 08/28/16 1241    Duffy Bruce, MD 08/28/16 [phone]

## 2016-08-27 NOTE — ED Triage Notes (Signed)
Pt c/o left sided chest pain that radiates to the back and worsened by deep breathing. Add chills and fever at home with elevated bp.

## 2016-08-27 NOTE — ED Notes (Signed)
Pt. Ambulated down the hall and back to the bathroom on 98% room air without assistance. Pt. Gait steady on her feet.

## 2016-08-28 DIAGNOSIS — R071 Chest pain on breathing: Secondary | ICD-10-CM | POA: Diagnosis not present

## 2016-08-28 DIAGNOSIS — Z79899 Other long term (current) drug therapy: Secondary | ICD-10-CM | POA: Diagnosis not present

## 2016-08-28 DIAGNOSIS — J189 Pneumonia, unspecified organism: Secondary | ICD-10-CM | POA: Diagnosis not present

## 2016-08-28 DIAGNOSIS — I10 Essential (primary) hypertension: Secondary | ICD-10-CM | POA: Diagnosis not present

## 2016-08-28 MED ORDER — ALBUTEROL SULFATE HFA 108 (90 BASE) MCG/ACT IN AERS
2.0000 | INHALATION_SPRAY | Freq: Once | RESPIRATORY_TRACT | Status: AC
  Administered 2016-08-28: 2 via RESPIRATORY_TRACT
  Filled 2016-08-28: qty 6.7

## 2016-08-28 MED ORDER — OXYCODONE-ACETAMINOPHEN 5-325 MG PO TABS: 1.0000 | ORAL_TABLET | Freq: Four times a day (QID) | ORAL | 0 refills | Status: DC | PRN

## 2016-08-28 MED ORDER — LEVOFLOXACIN 750 MG PO TABS: 750.0000 mg | ORAL_TABLET | Freq: Every day | ORAL | 0 refills | Status: DC

## 2016-08-28 MED ORDER — BENZONATATE 100 MG PO CAPS: 100.0000 mg | ORAL_CAPSULE | Freq: Three times a day (TID) | ORAL | 0 refills | Status: DC

## 2016-08-28 NOTE — Discharge Instructions (Signed)
Take Levaquin as prescribed until finished. Use an incentive from your once per hour while awake. Use an albuterol inhaler, 2 puffs every 6 hours, as needed for cough or shortness of breath. You may take Tessalon as needed for cough. You may take Percocet as needed for severe pain and ibuprofen for fever or body aches. Follow-up with your primary care doctor to ensure resolution of symptoms. Return for new or concerning symptoms.

## 2016-08-31 ENCOUNTER — Encounter: Payer: Self-pay | Admitting: Family Medicine

## 2016-08-31 ENCOUNTER — Ambulatory Visit (INDEPENDENT_AMBULATORY_CARE_PROVIDER_SITE_OTHER): Payer: 59 | Admitting: Family Medicine

## 2016-08-31 VITALS — BP 122/70 | HR 87 | Temp 98.1°F | Ht 67.0 in | Wt 202.5 lb

## 2016-08-31 DIAGNOSIS — E6609 Other obesity due to excess calories: Secondary | ICD-10-CM

## 2016-08-31 DIAGNOSIS — I10 Essential (primary) hypertension: Secondary | ICD-10-CM

## 2016-08-31 DIAGNOSIS — J189 Pneumonia, unspecified organism: Secondary | ICD-10-CM

## 2016-08-31 NOTE — Progress Notes (Signed)
HPI:  Rachel Fields is here for follow up after a recent emergency room visit for CAP: -ER visit 08/27/2016 per review of chart for pleuritis chest pain, chills, cough, fever -treated with levaquin, tessaon, IP and alb prn -reports: felling much better after a few days of levaquin - chest pain much improved, cough better, sputum less thick and no more Sok suptum -denies:fevers, sob, wheezing -PMH HTN, hyperlipidemia, hyperglycemia, poor compliance -added norvasc to her BP regimen in sept and advised 1 month follow up for recheck and fasting labs, she did not follow up. She no showed her last 2 appointments. Reports is taking her norvasc and lisinopril daily.  ROS: See pertinent positives and negatives per HPI.  Past Medical History:  Diagnosis Date  . Anxiety and depression   . HTN (hypertension)   . Hx of iron deficiency anemia    reports [place] trait; reports on iron her whole life  . Low back pain    chronic, reports hx DDD treated by chiropractor  . No blood products    Jehovah's Witness  . OAB (overactive bladder)   . Seasonal allergies   . Uterine fibroid     Past Surgical History:  Procedure Laterality Date  . CESAREAN SECTION    . FOOT SURGERY    . TUBAL LIGATION    . TUMOR REMOVAL     behind left eye    Family History  Problem Relation Age of Onset  . Diabetes Father   . Hypertension Father   . Dementia Father     senile  . Other Mother     smoker  . Lung cancer Mother     2010  . Bipolar disorder Sister   . Schizophrenia Sister   . Schizophrenia Sister   . Bipolar disorder Sister   . Schizophrenia Sister   . Bipolar disorder Sister   . Bipolar disorder Daughter     Social History   Social History  . Marital status: Single    Spouse name: N/A  . Number of children: N/A  . Years of education: N/A   Social History Main Topics  . Smoking status: Never Smoker  . Smokeless tobacco: Never Used  . Alcohol use Yes     Comment: socially - rare; 2  drinks a few times per month  . Drug use: No  . Sexual activity: Not Asked   Other Topics Concern  . None   Social History Narrative   Work or School: Quarry manager, working on Paediatric nurse Situation: Lives with husband - married in 2015; hx of homelessness in 2015      Spiritual Beliefs: Jehovah's Witness      Lifestyle: no regular CV exercise; diet improving - reports she has been able to loose weight                       Current Outpatient Prescriptions:  .  amLODipine (NORVASC) 5 MG tablet, Take 1 tablet (5 mg total) by mouth daily., Disp: 90 tablet, Rfl: 3 .  benzonatate (TESSALON) 100 MG capsule, Take 1 capsule (100 mg total) by mouth every 8 (eight) hours., Disp: 21 capsule, Rfl: 0 .  calcium-vitamin D (CALCIUM + D) 250-125 MG-UNIT per tablet, Take 1 tablet by mouth daily., Disp: 30 tablet, Rfl: 3 .  cyclobenzaprine (FLEXERIL) 5 MG tablet, Take 1 tablet (5 mg total) by mouth at bedtime. (Patient taking differently: Take 5 mg by mouth daily. ),  Disp: 30 tablet, Rfl: 0 .  Fe Fum-FA-B Cmp-C-Zn-Mg-Mn-Cu (HEMOCYTE PLUS) 106-1 MG CAPS, Take 1 capsule by mouth daily., Disp: 30 each, Rfl: 3 .  fluticasone (FLONASE) 50 MCG/ACT nasal spray, Place 1 spray into both nostrils at bedtime. (Patient taking differently: Place 1 spray into both nostrils at bedtime as needed for allergies. ), Disp: 16 g, Rfl: 3 .  fluticasone (FLONASE) 50 MCG/ACT nasal spray, Place 2 sprays into both nostrils daily. For 1 month, then 1 spray thereafter, Disp: 16 g, Rfl: 6 .  ibuprofen (ADVIL,MOTRIN) 200 MG tablet, Take 1,000 mg by mouth every 6 (six) hours as needed for moderate pain., Disp: , Rfl:  .  levofloxacin (LEVAQUIN) 750 MG tablet, Take 1 tablet (750 mg total) by mouth daily. Start on the evening of 08/28/16. Take until finished., Disp: 4 tablet, Rfl: 0 .  lisinopril (PRINIVIL,ZESTRIL) 10 MG tablet, Take 1 tablet (10 mg total) by mouth daily., Disp: 90 tablet, Rfl: 3 .  naproxen (NAPROSYN) 500 MG  tablet, Take 1 tablet (500 mg total) by mouth 2 (two) times daily with a meal., Disp: 30 tablet, Rfl: 0 .  OVER THE COUNTER MEDICATION, Take 2 tablets by mouth daily. Biotin 5028mcg, Disp: , Rfl:  .  oxyCODONE-acetaminophen (PERCOCET/ROXICET) 5-325 MG tablet, Take 1-2 tablets by mouth every 6 (six) hours as needed for severe pain., Disp: 10 tablet, Rfl: 0 .  tolterodine (DETROL LA) 4 MG 24 hr capsule, Take 1 capsule (4 mg total) by mouth daily., Disp: 90 capsule, Rfl: 3 .  venlafaxine XR (EFFEXOR-XR) 75 MG 24 hr capsule, Take 3 capsules by mouth daily with breakfast, Disp: 270 capsule, Rfl: 3  EXAM:  Vitals:   08/31/16 1419  BP: 122/70  Pulse: 87  Temp: 98.1 F (36.7 C)    Body mass index is 31.72 kg/m.  GENERAL: vitals reviewed and listed above, alert, oriented, appears well hydrated and in no acute distress  HEENT: atraumatic, conjunttiva clear, no obvious abnormalities on inspection of external nose and ears  NECK: no obvious masses on inspection  LUNGS: clear to auscultation bilaterally, no wheezes, rales or rhonchi, good air movement  CV: HRRR, no peripheral edema  MS: moves all extremities without noticeable abnormality  PSYCH: pleasant and cooperative, no obvious depression or anxiety  ASSESSMENT AND PLAN:  Discussed the following assessment and plan:  HCAP (healthcare-associated pneumonia)  HYPERTENSION, BENIGN  Obesity due to excess calories, unspecified classification, unspecified whether serious comorbidity present  -seems to be doing well and improving significantly on Levaquin, advised prompt eval over the holidays if were to rebound or get worse again -bp great on recheck  -follow up in 1 month, advised will need repeat cxr, recheck BP -Patient advised to return or notify a doctor immediately if symptoms worsen or persist or new concerns arise.  Patient Instructions  BEFORE YOU LEAVE: -follow up: 1 month, will need to get CXR and recheck BP  Finish  the course of levaquin. Seek care if worsening, recurrent symptoms, fevers or other concerns.  Take both of your blood pressure medications every day (lisinopril and norvasc)   We recommend the following healthy lifestyle for LIFE: 1) Small portions.   Tip: eat off of a salad plate instead of a dinner plate.  Tip: if you need more or a snack choose fruits, veggies and/or a handful of nuts or seeds.  2) Eat a healthy clean diet.  * Tip: Avoid (less then 1 serving per week): processed foods, sweets, sweetened drinks, white  starches (rice, flour, bread, potatoes, pasta, etc), red meat, fast foods, butter  *Tip: CHOOSE instead   * 5-9 servings per day of fresh or frozen fruits and vegetables (but not corn, potatoes, bananas, canned or dried fruit)   *nuts and seeds, beans   *olives and olive oil   *small portions of lean meats such as fish and white chicken    *small portions of whole grains  3)Get at least 150 minutes of sweaty aerobic exercise per week.  4)Reduce stress - consider counseling, meditation and relaxation to balance other aspects of your life.'    Lucretia Kern., DO

## 2016-08-31 NOTE — Progress Notes (Signed)
Pre visit review using our clinic review tool, if applicable. No additional management support is needed unless otherwise documented below in the visit note. 

## 2016-08-31 NOTE — Patient Instructions (Signed)
BEFORE YOU LEAVE: -follow up: 1 month, will need to get CXR and recheck BP  Finish the course of levaquin. Seek care if worsening, recurrent symptoms, fevers or other concerns.  Take both of your blood pressure medications every day (lisinopril and norvasc)   We recommend the following healthy lifestyle for LIFE: 1) Small portions.   Tip: eat off of a salad plate instead of a dinner plate.  Tip: if you need more or a snack choose fruits, veggies and/or a handful of nuts or seeds.  2) Eat a healthy clean diet.  * Tip: Avoid (less then 1 serving per week): processed foods, sweets, sweetened drinks, white starches (rice, flour, bread, potatoes, pasta, etc), red meat, fast foods, butter  *Tip: CHOOSE instead   * 5-9 servings per day of fresh or frozen fruits and vegetables (but not corn, potatoes, bananas, canned or dried fruit)   *nuts and seeds, beans   *olives and olive oil   *small portions of lean meats such as fish and white chicken    *small portions of whole grains  3)Get at least 150 minutes of sweaty aerobic exercise per week.  4)Reduce stress - consider counseling, meditation and relaxation to balance other aspects of your life.'

## 2016-09-15 MED FILL — LISINOPRIL 10 MG TABLET: 10 | 90 days supply | Qty: 90 | Fill #1

## 2016-09-15 MED FILL — VENLAFAXINE HCL ER 75 MG CA: 75 | 30 days supply | Qty: 90 | Fill #2

## 2016-09-16 ENCOUNTER — Ambulatory Visit (INDEPENDENT_AMBULATORY_CARE_PROVIDER_SITE_OTHER): Payer: 59 | Admitting: Family Medicine

## 2016-09-16 ENCOUNTER — Encounter: Payer: Self-pay | Admitting: Family Medicine

## 2016-09-16 VITALS — BP 126/80 | HR 82 | Temp 97.7°F | Ht 67.0 in

## 2016-09-16 DIAGNOSIS — M722 Plantar fascial fibromatosis: Secondary | ICD-10-CM | POA: Diagnosis not present

## 2016-09-16 NOTE — Progress Notes (Signed)
HPI:  Acute visit for R foot pain: -started about 1 month ago -reports hx plantar fasciitis -pain in R plantar fascia -better with ice -no known injury or trauma -otc analgesic help  ROS: See pertinent positives and negatives per HPI.  Past Medical History:  Diagnosis Date  . Anxiety and depression   . HTN (hypertension)   . Hx of iron deficiency anemia    reports [place] trait; reports on iron her whole life  . Low back pain    chronic, reports hx DDD treated by chiropractor  . No blood products    Jehovah's Witness  . OAB (overactive bladder)   . Seasonal allergies   . Uterine fibroid     Past Surgical History:  Procedure Laterality Date  . CESAREAN SECTION    . FOOT SURGERY    . TUBAL LIGATION    . TUMOR REMOVAL     behind left eye    Family History  Problem Relation Age of Onset  . Diabetes Father   . Hypertension Father   . Dementia Father     senile  . Other Mother     smoker  . Lung cancer Mother     2010  . Bipolar disorder Sister   . Schizophrenia Sister   . Schizophrenia Sister   . Bipolar disorder Sister   . Schizophrenia Sister   . Bipolar disorder Sister   . Bipolar disorder Daughter     Social History   Social History  . Marital status: Single    Spouse name: N/A  . Number of children: N/A  . Years of education: N/A   Social History Main Topics  . Smoking status: Never Smoker  . Smokeless tobacco: Never Used  . Alcohol use Yes     Comment: socially - rare; 2 drinks a few times per month  . Drug use: No  . Sexual activity: Not Asked   Other Topics Concern  . None   Social History Narrative   Work or School: Quarry manager, working on Paediatric nurse Situation: Lives with husband - married in 2015; hx of homelessness in 2015      Spiritual Beliefs: Jehovah's Witness      Lifestyle: no regular CV exercise; diet improving - reports she has been able to loose weight                       Current Outpatient Prescriptions:  .   amLODipine (NORVASC) 5 MG tablet, Take 1 tablet (5 mg total) by mouth daily., Disp: 90 tablet, Rfl: 3 .  benzonatate (TESSALON) 100 MG capsule, Take 1 capsule (100 mg total) by mouth every 8 (eight) hours., Disp: 21 capsule, Rfl: 0 .  calcium-vitamin D (CALCIUM + D) 250-125 MG-UNIT per tablet, Take 1 tablet by mouth daily., Disp: 30 tablet, Rfl: 3 .  cyclobenzaprine (FLEXERIL) 5 MG tablet, Take 1 tablet (5 mg total) by mouth at bedtime. (Patient taking differently: Take 5 mg by mouth daily. ), Disp: 30 tablet, Rfl: 0 .  Fe Fum-FA-B Cmp-C-Zn-Mg-Mn-Cu (HEMOCYTE PLUS) 106-1 MG CAPS, Take 1 capsule by mouth daily., Disp: 30 each, Rfl: 3 .  fluticasone (FLONASE) 50 MCG/ACT nasal spray, Place 1 spray into both nostrils at bedtime. (Patient taking differently: Place 1 spray into both nostrils at bedtime as needed for allergies. ), Disp: 16 g, Rfl: 3 .  fluticasone (FLONASE) 50 MCG/ACT nasal spray, Place 2 sprays into both nostrils daily. For  1 month, then 1 spray thereafter, Disp: 16 g, Rfl: 6 .  ibuprofen (ADVIL,MOTRIN) 200 MG tablet, Take 1,000 mg by mouth every 6 (six) hours as needed for moderate pain., Disp: , Rfl:  .  levofloxacin (LEVAQUIN) 750 MG tablet, Take 1 tablet (750 mg total) by mouth daily. Start on the evening of 08/28/16. Take until finished., Disp: 4 tablet, Rfl: 0 .  lisinopril (PRINIVIL,ZESTRIL) 10 MG tablet, Take 1 tablet (10 mg total) by mouth daily., Disp: 90 tablet, Rfl: 3 .  naproxen (NAPROSYN) 500 MG tablet, Take 1 tablet (500 mg total) by mouth 2 (two) times daily with a meal., Disp: 30 tablet, Rfl: 0 .  OVER THE COUNTER MEDICATION, Take 2 tablets by mouth daily. Biotin 5073mcg, Disp: , Rfl:  .  oxyCODONE-acetaminophen (PERCOCET/ROXICET) 5-325 MG tablet, Take 1-2 tablets by mouth every 6 (six) hours as needed for severe pain., Disp: 10 tablet, Rfl: 0 .  tolterodine (DETROL LA) 4 MG 24 hr capsule, Take 1 capsule (4 mg total) by mouth daily., Disp: 90 capsule, Rfl: 3 .  venlafaxine  XR (EFFEXOR-XR) 75 MG 24 hr capsule, Take 3 capsules by mouth daily with breakfast, Disp: 270 capsule, Rfl: 3  EXAM:  Vitals:   09/16/16 1352  BP: 126/80  Pulse: 82  Temp: 97.7 F (36.5 C)    There is no height or weight on file to calculate BMI.  GENERAL: vitals reviewed and listed above, alert, oriented, appears well hydrated and in no acute distress  HEENT: atraumatic, conjunttiva clear, no obvious abnormalities on inspection of external nose and ears  NECK: no obvious masses on inspection  LUNGS: clear to auscultation bilaterally, no wheezes, rales or rhonchi, good air movement  CV: HRRR, no peripheral edema  MS: moves all extremities without noticeable abnormality, tenderness to palpation in the right plantar fascia, no bony tenderness to palpation, normal strength and range of motion of the toes, ankle and foot, neurovascularly intact distally, no laxity on drawer testing,no tarsal tunnel tenderness  PSYCH: pleasant and cooperative, no obvious depression or anxiety  ASSESSMENT AND PLAN:  Discussed the following assessment and plan:  Plantar fasciitis  -we discussed possible serious and likely etiologies, workup and treatment, treatment risks and return precautions -after this discussion, Genea opted for home exercises, [place] sock, ice and over-the-counter analgesics -follow up advised in about 1 month, she already has an appointment scheduled -of course, we advised Rotha  to return or notify a doctor immediately if symptoms worsen or persist or new concerns arise.   Patient Instructions  BEFORE YOU LEAVE: -follow up: as scheduled -plantar fasciitis exercises  Please get a [place] sock to try.  Wear tennis shoes.  Ice as needed.  Tylenol or aleve per instructions as needed for pain.    Colin Benton R., DO

## 2016-09-16 NOTE — Patient Instructions (Addendum)
BEFORE YOU LEAVE: -follow up: as scheduled -plantar fasciitis exercises  Please get a [place] sock to try.  Wear tennis shoes.  Ice as needed.  Tylenol or aleve per instructions as needed for pain.

## 2016-09-16 NOTE — Progress Notes (Signed)
Pre visit review using our clinic review tool, if applicable. No additional management support is needed unless otherwise documented below in the visit note. Patient unable to stand for weight measurement today.

## 2016-09-29 NOTE — Progress Notes (Deleted)
HPI:  Follow up: Due for colon cancer screening, shingles vaccine, Hgba1c  CAP -treated with levaquin in November -due for repeat CXR -reports: -denies:  R foot pain: -presumed plantar fasciitis, see last OV notes -reports: -denies:  HTN: -here for recheck  -advised to take both lisinopril and norvasc daily at visit in November, poor compliance in the past  ROS: See pertinent positives and negatives per HPI.  Past Medical History:  Diagnosis Date  . Anxiety and depression   . HTN (hypertension)   . Hx of iron deficiency anemia    reports [place] trait; reports on iron her whole life  . Low back pain    chronic, reports hx DDD treated by chiropractor  . No blood products    Jehovah's Witness  . OAB (overactive bladder)   . Seasonal allergies   . Uterine fibroid     Past Surgical History:  Procedure Laterality Date  . CESAREAN SECTION    . FOOT SURGERY    . TUBAL LIGATION    . TUMOR REMOVAL     behind left eye    Family History  Problem Relation Age of Onset  . Diabetes Father   . Hypertension Father   . Dementia Father     senile  . Other Mother     smoker  . Lung cancer Mother     2010  . Bipolar disorder Sister   . Schizophrenia Sister   . Schizophrenia Sister   . Bipolar disorder Sister   . Schizophrenia Sister   . Bipolar disorder Sister   . Bipolar disorder Daughter     Social History   Social History  . Marital status: Single    Spouse name: N/A  . Number of children: N/A  . Years of education: N/A   Social History Main Topics  . Smoking status: Never Smoker  . Smokeless tobacco: Never Used  . Alcohol use Yes     Comment: socially - rare; 2 drinks a few times per month  . Drug use: No  . Sexual activity: Not on file   Other Topics Concern  . Not on file   Social History Narrative   Work or School: CNA, working on Pensions consultant      Home Situation: Lives with husband - married in 2015; hx of homelessness in 2015      Spiritual  Beliefs: Jehovah's Witness      Lifestyle: no regular CV exercise; diet improving - reports she has been able to loose weight                       Current Outpatient Prescriptions:  .  amLODipine (NORVASC) 5 MG tablet, Take 1 tablet (5 mg total) by mouth daily., Disp: 90 tablet, Rfl: 3 .  benzonatate (TESSALON) 100 MG capsule, Take 1 capsule (100 mg total) by mouth every 8 (eight) hours., Disp: 21 capsule, Rfl: 0 .  calcium-vitamin D (CALCIUM + D) 250-125 MG-UNIT per tablet, Take 1 tablet by mouth daily., Disp: 30 tablet, Rfl: 3 .  cyclobenzaprine (FLEXERIL) 5 MG tablet, Take 1 tablet (5 mg total) by mouth at bedtime. (Patient taking differently: Take 5 mg by mouth daily. ), Disp: 30 tablet, Rfl: 0 .  Fe Fum-FA-B Cmp-C-Zn-Mg-Mn-Cu (HEMOCYTE PLUS) 106-1 MG CAPS, Take 1 capsule by mouth daily., Disp: 30 each, Rfl: 3 .  fluticasone (FLONASE) 50 MCG/ACT nasal spray, Place 1 spray into both nostrils at bedtime. (Patient taking differently: Place 1 spray  into both nostrils at bedtime as needed for allergies. ), Disp: 16 g, Rfl: 3 .  fluticasone (FLONASE) 50 MCG/ACT nasal spray, Place 2 sprays into both nostrils daily. For 1 month, then 1 spray thereafter, Disp: 16 g, Rfl: 6 .  ibuprofen (ADVIL,MOTRIN) 200 MG tablet, Take 1,000 mg by mouth every 6 (six) hours as needed for moderate pain., Disp: , Rfl:  .  levofloxacin (LEVAQUIN) 750 MG tablet, Take 1 tablet (750 mg total) by mouth daily. Start on the evening of 08/28/16. Take until finished., Disp: 4 tablet, Rfl: 0 .  lisinopril (PRINIVIL,ZESTRIL) 10 MG tablet, Take 1 tablet (10 mg total) by mouth daily., Disp: 90 tablet, Rfl: 3 .  naproxen (NAPROSYN) 500 MG tablet, Take 1 tablet (500 mg total) by mouth 2 (two) times daily with a meal., Disp: 30 tablet, Rfl: 0 .  OVER THE COUNTER MEDICATION, Take 2 tablets by mouth daily. Biotin 506mcg, Disp: , Rfl:  .  oxyCODONE-acetaminophen (PERCOCET/ROXICET) 5-325 MG tablet, Take 1-2 tablets by mouth every 6  (six) hours as needed for severe pain., Disp: 10 tablet, Rfl: 0 .  tolterodine (DETROL LA) 4 MG 24 hr capsule, Take 1 capsule (4 mg total) by mouth daily., Disp: 90 capsule, Rfl: 3 .  venlafaxine XR (EFFEXOR-XR) 75 MG 24 hr capsule, Take 3 capsules by mouth daily with breakfast, Disp: 270 capsule, Rfl: 3  EXAM:  There were no vitals filed for this visit.  There is no height or weight on file to calculate BMI.  GENERAL: vitals reviewed and listed above, alert, oriented, appears well hydrated and in no acute distress  HEENT: atraumatic, conjunttiva clear, no obvious abnormalities on inspection of external nose and ears  NECK: no obvious masses on inspection  LUNGS: clear to auscultation bilaterally, no wheezes, rales or rhonchi, good air movement  CV: HRRR, no peripheral edema  MS: moves all extremities without noticeable abnormality  PSYCH: pleasant and cooperative, no obvious depression or anxiety  ASSESSMENT AND PLAN:  Discussed the following assessment and plan:  No diagnosis found.  -Patient advised to return or notify a doctor immediately if symptoms worsen or persist or new concerns arise.  There are no Patient Instructions on file for this visit.  Colin Benton R., DO

## 2016-09-30 ENCOUNTER — Ambulatory Visit: Payer: 59 | Admitting: Family Medicine

## 2016-09-30 DIAGNOSIS — Z0289 Encounter for other administrative examinations: Secondary | ICD-10-CM

## 2016-10-08 ENCOUNTER — Telehealth: Payer: Self-pay | Admitting: *Deleted

## 2016-10-08 NOTE — Telephone Encounter (Signed)
Pt no show for PV 10/08/16 at 230 pm. Messages left on both home and cell phone that PV appt needs to be rescheduled by 5pm today if pt wishes to keep colon sched 10/22/16.

## 2016-10-08 NOTE — Telephone Encounter (Signed)
2nd attempt to call pt to reschedule previsit.

## 2016-10-08 NOTE — Telephone Encounter (Signed)
Pt rescheduled PV to 10/12/16.

## 2016-10-13 ENCOUNTER — Ambulatory Visit (AMBULATORY_SURGERY_CENTER): Payer: Self-pay | Admitting: *Deleted

## 2016-10-13 VITALS — Ht 67.0 in | Wt 199.0 lb

## 2016-10-13 DIAGNOSIS — Z1211 Encounter for screening for malignant neoplasm of colon: Secondary | ICD-10-CM

## 2016-10-13 MED ORDER — NA SULFATE-K SULFATE-MG SULF 17.5-3.13-1.6 GM/177ML PO SOLN: 1.0000 | Freq: Once | ORAL | 0 refills | Status: AC

## 2016-10-13 NOTE — Progress Notes (Signed)
No allergies to eggs or soy. No problems with anesthesia.  Pt given Emmi instructions for colonoscopy  No oxygen use  No diet drug use  

## 2016-10-18 ENCOUNTER — Ambulatory Visit (INDEPENDENT_AMBULATORY_CARE_PROVIDER_SITE_OTHER): Payer: 59 | Admitting: Family Medicine

## 2016-10-18 DIAGNOSIS — Z0289 Encounter for other administrative examinations: Secondary | ICD-10-CM

## 2016-10-18 NOTE — Progress Notes (Deleted)
HPI:  Seren Winemiller is a pleasant 61 yo here for follow up depression, HLD, DM, anemia, recent CAP, plantar fasciitis and HTN.  She reports. Denies. Due for repeat CXR, colon cancer screening and labs. ROS: See pertinent positives and negatives per HPI.  Past Medical History:  Diagnosis Date  . Allergy   . Anxiety   . Anxiety and depression   . Arthritis   . Depression   . HTN (hypertension)   . Hx of iron deficiency anemia    reports [place] trait; reports on iron her whole life  . Low back pain    chronic, reports hx DDD treated by chiropractor  . No blood products    Jehovah's Witness  . OAB (overactive bladder)   . Seasonal allergies   . Sickle cell trait ([place])   . Uterine fibroid     Past Surgical History:  Procedure Laterality Date  . BUNIONECTOMY Bilateral 1987  . CESAREAN SECTION    . TUBAL LIGATION    . TUMOR REMOVAL     behind left eye    Family History  Problem Relation Age of Onset  . Diabetes Father   . Hypertension Father   . Dementia Father     senile  . Other Mother     smoker  . Lung cancer Mother     2010  . Bipolar disorder Sister   . Schizophrenia Sister   . Schizophrenia Sister   . Bipolar disorder Sister   . Schizophrenia Sister   . Bipolar disorder Sister   . Bipolar disorder Daughter   . Colon cancer Neg Hx     Social History   Social History  . Marital status: Single    Spouse name: N/A  . Number of children: N/A  . Years of education: N/A   Social History Main Topics  . Smoking status: Never Smoker  . Smokeless tobacco: Never Used  . Alcohol use Yes     Comment: socially - rare; 2 drinks a few times per month  . Drug use: No  . Sexual activity: Not on file   Other Topics Concern  . Not on file   Social History Narrative   Work or School: CNA, working on Pensions consultant      Home Situation: Lives with husband - married in 2015; hx of homelessness in 2015      Spiritual Beliefs: Jehovah's Witness      Lifestyle: no regular  CV exercise; diet improving - reports she has been able to loose weight                       Current Outpatient Prescriptions:  .  calcium-vitamin D (CALCIUM + D) 250-125 MG-UNIT per tablet, Take 1 tablet by mouth daily., Disp: 30 tablet, Rfl: 3 .  cyclobenzaprine (FLEXERIL) 5 MG tablet, Take 1 tablet (5 mg total) by mouth at bedtime. (Patient taking differently: Take 5 mg by mouth daily. ), Disp: 30 tablet, Rfl: 0 .  Fe Fum-FA-B Cmp-C-Zn-Mg-Mn-Cu (HEMOCYTE PLUS) 106-1 MG CAPS, Take 1 capsule by mouth daily., Disp: 30 each, Rfl: 3 .  fluticasone (FLONASE) 50 MCG/ACT nasal spray, Place 1 spray into both nostrils at bedtime. (Patient taking differently: Place 1 spray into both nostrils at bedtime as needed for allergies. ), Disp: 16 g, Rfl: 3 .  fluticasone (FLONASE) 50 MCG/ACT nasal spray, Place 2 sprays into both nostrils daily. For 1 month, then 1 spray thereafter, Disp: 16 g, Rfl: 6 .  ibuprofen (ADVIL,MOTRIN) 200 MG tablet, Take 1,000 mg by mouth every 6 (six) hours as needed for moderate pain., Disp: , Rfl:  .  lisinopril (PRINIVIL,ZESTRIL) 10 MG tablet, Take 1 tablet (10 mg total) by mouth daily., Disp: 90 tablet, Rfl: 3 .  naproxen (NAPROSYN) 500 MG tablet, Take 1 tablet (500 mg total) by mouth 2 (two) times daily with a meal., Disp: 30 tablet, Rfl: 0 .  OVER THE COUNTER MEDICATION, Take 2 tablets by mouth daily. Biotin 5048mcg, Disp: , Rfl:  .  tolterodine (DETROL LA) 4 MG 24 hr capsule, Take 1 capsule (4 mg total) by mouth daily., Disp: 90 capsule, Rfl: 3 .  venlafaxine XR (EFFEXOR-XR) 75 MG 24 hr capsule, Take 3 capsules by mouth daily with breakfast, Disp: 270 capsule, Rfl: 3  EXAM:  There were no vitals filed for this visit.  There is no height or weight on file to calculate BMI.  GENERAL: vitals reviewed and listed above, alert, oriented, appears well hydrated and in no acute distress  HEENT: atraumatic, conjunttiva clear, no obvious abnormalities on inspection of external  nose and ears  NECK: no obvious masses on inspection  LUNGS: clear to auscultation bilaterally, no wheezes, rales or rhonchi, good air movement  CV: HRRR, no peripheral edema  MS: moves all extremities without noticeable abnormality  PSYCH: pleasant and cooperative, no obvious depression or anxiety  ASSESSMENT AND PLAN:  Discussed the following assessment and plan:  No diagnosis found.  -Patient advised to return or notify a doctor immediately if symptoms worsen or persist or new concerns arise.  There are no Patient Instructions on file for this visit.  Colin Benton R., DO

## 2016-10-21 ENCOUNTER — Telehealth: Payer: Self-pay | Admitting: Gastroenterology

## 2016-10-21 MED FILL — FLUTICASONE PROP 50 MCG SPR: 50 | 60 days supply | Qty: 16 | Fill #0

## 2016-10-21 MED FILL — TOLTERODINE TART ER 4 MG CA: 4 | 30 days supply | Qty: 30 | Fill #2

## 2016-10-21 MED FILL — VENLAFAXINE HCL ER 75 MG CA: 75 | 30 days supply | Qty: 90 | Fill #3

## 2016-10-21 NOTE — Telephone Encounter (Signed)
Patient needs to go over prep instructions as well

## 2016-10-21 NOTE — Telephone Encounter (Signed)
Yes charge 

## 2016-10-22 ENCOUNTER — Encounter: Payer: 59 | Admitting: Gastroenterology

## 2016-11-01 ENCOUNTER — Ambulatory Visit (INDEPENDENT_AMBULATORY_CARE_PROVIDER_SITE_OTHER): Payer: 59 | Admitting: Family Medicine

## 2016-11-01 ENCOUNTER — Telehealth: Payer: Self-pay | Admitting: *Deleted

## 2016-11-01 ENCOUNTER — Encounter: Payer: Self-pay | Admitting: Family Medicine

## 2016-11-01 VITALS — BP 136/86 | HR 87 | Temp 98.3°F | Ht 67.0 in | Wt 199.4 lb

## 2016-11-01 DIAGNOSIS — M79671 Pain in right foot: Secondary | ICD-10-CM | POA: Diagnosis not present

## 2016-11-01 MED ORDER — NAPROXEN 500 MG PO TABS: ORAL_TABLET | ORAL | 0 refills | Status: DC

## 2016-11-01 NOTE — Telephone Encounter (Signed)
Rachel Fields scheduled the pt an appt for 1/23 at 9:15am to see Dr French Ana.  I called the pt and left a detailed message with the appt info and address and phone number to call to reschedule if this is not a good time for her as the pt left the office.

## 2016-11-01 NOTE — Progress Notes (Signed)
HPI:  Follow up R foot pain. Thought to be plantar fasciitis at last visit. She reports she tried the [place] sock. Reports wears water shoes when hurts as these feel best - has them on today. No weakness, numbness. Not taking anything currently. Did use and "oxy" she had at home yesterday and this helps. Reports she did not let us know not doing better because she was on disability for this in the past and "wants to work..ibuprofen can not not work." Wants to see specialist.  ROS: See pertinent positives and negatives per HPI.  Past Medical History:  Diagnosis Date  . Allergy   . Anxiety   . Anxiety and depression   . Arthritis   . Depression   . HTN (hypertension)   . Hx of iron deficiency anemia    reports [place] trait; reports on iron her whole life  . Low back pain    chronic, reports hx DDD treated by chiropractor  . No blood products    Jehovah's Witness  . OAB (overactive bladder)   . Seasonal allergies   . Sickle cell trait ([place])   . Uterine fibroid     Past Surgical History:  Procedure Laterality Date  . BUNIONECTOMY Bilateral 1987  . CESAREAN SECTION    . TUBAL LIGATION    . TUMOR REMOVAL     behind left eye    Family History  Problem Relation Age of Onset  . Diabetes Father   . Hypertension Father   . Dementia Father     senile  . Other Mother     smoker  . Lung cancer Mother     2010  . Bipolar disorder Sister   . Schizophrenia Sister   . Schizophrenia Sister   . Bipolar disorder Sister   . Schizophrenia Sister   . Bipolar disorder Sister   . Bipolar disorder Daughter   . Colon cancer Neg Hx     Social History   Social History  . Marital status: Single    Spouse name: N/A  . Number of children: N/A  . Years of education: N/A   Social History Main Topics  . Smoking status: Never Smoker  . Smokeless tobacco: Never Used  . Alcohol use Yes     Comment: socially - rare; 2 drinks a few times per month  . Drug use: No  . Sexual activity: Not  Asked   Other Topics Concern  . None   Social History Narrative   Work or School: Quarry manager, working on Paediatric nurse Situation: Lives with husband - married in 2015; hx of homelessness in 2015      Spiritual Beliefs: Jehovah's Witness      Lifestyle: no regular CV exercise; diet improving - reports she has been able to loose weight                       Current Outpatient Prescriptions:  .  calcium-vitamin D (CALCIUM + D) 250-125 MG-UNIT per tablet, Take 1 tablet by mouth daily., Disp: 30 tablet, Rfl: 3 .  cyclobenzaprine (FLEXERIL) 5 MG tablet, Take 1 tablet (5 mg total) by mouth at bedtime. (Patient taking differently: Take 5 mg by mouth daily. ), Disp: 30 tablet, Rfl: 0 .  Fe Fum-FA-B Cmp-C-Zn-Mg-Mn-Cu (HEMOCYTE PLUS) 106-1 MG CAPS, Take 1 capsule by mouth daily., Disp: 30 each, Rfl: 3 .  fluticasone (FLONASE) 50 MCG/ACT nasal spray, Place 1 spray into both nostrils  at bedtime. (Patient taking differently: Place 1 spray into both nostrils at bedtime as needed for allergies. ), Disp: 16 g, Rfl: 3 .  fluticasone (FLONASE) 50 MCG/ACT nasal spray, Place 2 sprays into both nostrils daily. For 1 month, then 1 spray thereafter, Disp: 16 g, Rfl: 6 .  ibuprofen (ADVIL,MOTRIN) 200 MG tablet, Take 1,000 mg by mouth every 6 (six) hours as needed for moderate pain., Disp: , Rfl:  .  lisinopril (PRINIVIL,ZESTRIL) 10 MG tablet, Take 1 tablet (10 mg total) by mouth daily., Disp: 90 tablet, Rfl: 3 .  naproxen (NAPROSYN) 500 MG tablet, 1 tablet 1-2 times daily as needed for pain, Disp: 30 tablet, Rfl: 0 .  OVER THE COUNTER MEDICATION, Take 2 tablets by mouth daily. Biotin 5037mcg, Disp: , Rfl:  .  tolterodine (DETROL LA) 4 MG 24 hr capsule, Take 1 capsule (4 mg total) by mouth daily., Disp: 90 capsule, Rfl: 3 .  venlafaxine XR (EFFEXOR-XR) 75 MG 24 hr capsule, Take 3 capsules by mouth daily with breakfast, Disp: 270 capsule, Rfl: 3  EXAM:  Vitals:   11/01/16 1609  BP: 136/86  Pulse: 87   Temp: 98.3 F (36.8 C)    Body mass index is 31.23 kg/m.  GENERAL: vitals reviewed and listed above, alert, oriented, appears well hydrated and in no acute distress  HEENT: atraumatic, conjunttiva clear, no obvious abnormalities on inspection of external nose and ears  NECK: no obvious masses on inspection  LUNGS: clear to auscultation bilaterally, no wheezes, rales or rhonchi, good air movement  CV: HRRR, no peripheral edema  MS: moves all extremities without noticeable abnormality, wearing worn out water shoes, TTP plantar fascia region R foot worse with dorsiflexion, no redness or swelling, NV intact distal.  PSYCH: pleasant and cooperative, no obvious depression or anxiety  ASSESSMENT AND PLAN:  Discussed the following assessment and plan:  Right foot pain - Plan: Ambulatory referral to Sports Medicine  -sent naproxen for next few weeks -ice -referral to sports med -advised good shoes -follow up BP in 3 months -Patient advised to return or notify a doctor immediately if symptoms worsen or persist or new concerns arise.  Patient Instructions  BEFORE YOU LEAVE: -follow up: 3 months for blood pressure  We placed a referral for you as discussed to a sports medicine specialist for foot pain. It usually takes about 1-2 weeks to process and schedule this referral. If you have not heard from [place] regarding this appointment in 2 weeks please contact our office.     Colin Benton R., DO

## 2016-11-01 NOTE — Progress Notes (Signed)
Pre visit review using our clinic review tool, if applicable. No additional management support is needed unless otherwise documented below in the visit note. 

## 2016-11-01 NOTE — Patient Instructions (Signed)
BEFORE YOU LEAVE: -follow up: 3 months for blood pressure  We placed a referral for you as discussed to a sports medicine specialist for foot pain. It usually takes about 1-2 weeks to process and schedule this referral. If you have not heard from [place] regarding this appointment in 2 weeks please contact our office.

## 2016-11-02 DIAGNOSIS — M25571 Pain in right ankle and joints of right foot: Secondary | ICD-10-CM | POA: Diagnosis not present

## 2016-11-16 DIAGNOSIS — M25571 Pain in right ankle and joints of right foot: Secondary | ICD-10-CM | POA: Diagnosis not present

## 2016-11-16 MED FILL — NAPROXEN 500 MG TABLET: 500 | 15 days supply | Qty: 30 | Fill #0

## 2016-11-19 ENCOUNTER — Encounter: Payer: Self-pay | Admitting: Physical Therapy

## 2016-11-19 ENCOUNTER — Ambulatory Visit: Payer: 59 | Attending: Orthopedic Surgery | Admitting: Physical Therapy

## 2016-11-19 DIAGNOSIS — R262 Difficulty in walking, not elsewhere classified: Secondary | ICD-10-CM | POA: Insufficient documentation

## 2016-11-19 DIAGNOSIS — M79671 Pain in right foot: Secondary | ICD-10-CM | POA: Insufficient documentation

## 2016-11-19 NOTE — Therapy (Signed)
[place], [place], [address] Phone: [phone]   Fax:  [phone]  Physical Therapy Evaluation  Patient Details  Name: Rachel Fields MRN: [id number] Date of Birth: Mar 05, 1956 Referring Provider: Earlie Server, MD  Encounter Date: 11/19/2016      PT End of Session - 11/19/16 0807    Visit Number 1   Number of Visits 5   Date for PT Re-Evaluation 12/10/16   Authorization Type [facility] UMR   PT Start Time 0803   PT Stop Time 0844   PT Time Calculation (min) 41 min   Activity Tolerance Patient tolerated treatment well   Behavior During Therapy [facility] for tasks assessed/performed      Past Medical History:  Diagnosis Date  . Allergy   . Anxiety   . Anxiety and depression   . Arthritis   . Depression   . HTN (hypertension)   . Hx of iron deficiency anemia    reports [place] trait; reports on iron her whole life  . Low back pain    chronic, reports hx DDD treated by chiropractor  . No blood products    Jehovah's Witness  . OAB (overactive bladder)   . Seasonal allergies   . Sickle cell trait ([place])   . Uterine fibroid     Past Surgical History:  Procedure Laterality Date  . BUNIONECTOMY Bilateral 1987  . CESAREAN SECTION    . TUBAL LIGATION    . TUMOR REMOVAL     behind left eye    There were no vitals filed for this visit.       Subjective Assessment - 11/19/16 0807    Subjective Cannot put pressure on R foot when getting out of bed. Began about 3 months ago. Does well at work for 4 hours (8 hour shifts). 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    Patient Stated Goals night throbbing, stand,    Currently in Pain? Yes   Pain Score 5    Pain Location Foot   Pain Orientation --  proximal attachment of plantar fasica and around heel   Aggravating Factors  weight bearing   Pain Relieving Factors rest, ice, stretch, meds            [facility] PT Assessment - 11/19/16 0001      Assessment   Medical Diagnosis R heel plantar fasciitis   Referring Provider Earlie Server, MD   Next MD Visit --  approx 3 weeks   Prior Therapy last year for back     Precautions   Precautions None     Restrictions   Weight Bearing Restrictions No     Balance Screen   Has the patient fallen in the past 6 months No     [place] residence     Prior Function   Level of Independence Independent     Cognition   Overall Cognitive Status Within Functional Limits for tasks assessed     Observation/Other Assessments   Focus on Therapeutic Outcomes (FOTO)  72% limitation     ROM / Strength   AROM / PROM / Strength PROM;Strength;AROM     AROM   AROM Assessment Site Ankle   Right/Left Ankle Right;Left   Right Ankle Dorsiflexion 1   Left Ankle Dorsiflexion 6     PROM   PROM Assessment Site Ankle   Right/Left Ankle Right;Left   Right Ankle Dorsiflexion 4   Left Ankle  Dorsiflexion 10     Strength   Overall Strength Comments great toe flexion pain,    Strength Assessment Site Ankle   Right/Left Ankle Right;Left     Palpation   Palpation comment proximal plantar fascia attachment TTP     Ambulation/Gait   Gait Comments antalgic on R, lacking heel strike                   [facility] Adult PT Treatment/Exercise - 11/19/16 0001      Exercises   Exercises Ankle     Modalities   Modalities Iontophoresis     Iontophoresis   Type of Iontophoresis Dexamethasone   Location R plantar fascia   Dose 1cc   Time 8 min  including education, setup and placement     Ankle Exercises: Stretches   Soleus Stretch Limitations long sitting with towel   Gastroc Stretch Limitations long sitting with towel   Other Stretch great toe extension      Ankle Exercises: Seated   Other Seated Ankle Exercises inversion & eversion red tband                PT Education - 11/19/16 1040    Education provided Yes   Education Details anatomy of  condition, POC, HEP, exercise form/rationale, ionto   Person(s) Educated Patient   Methods Explanation;Demonstration;Tactile cues;Verbal cues;Handout   Comprehension Verbalized understanding;Returned demonstration;Verbal cues required;Tactile cues required;Need further instruction          PT Short Term Goals - 11/19/16 1046      PT SHORT TERM GOAL #1   Title Pt will verbalize pain <=3/10 while at work   Baseline severe pain at eval   Time 3   Period Weeks   Status New     PT SHORT TERM GOAL #2   Title Pt will verbalize ability to incorporate stretches into daily activities to avoid increasing pain   Baseline began educating at eval   Time 3   Period Weeks   Status New     PT SHORT TERM GOAL #3   Title Pt will demo proper heel toe gait pattern to decrease poor input to ankle/foot joints   Baseline antalgic and flat-foot gait at eval   Time 3   Period Weeks   Status New     PT SHORT TERM GOAL #4   Title Pt will not be woken at night by painful throbbing of foot   Baseline woken multiple times through the night at eval   Time 3   Period Weeks   Status New                  Plan - 11/19/16 1041    Clinical Impression Statement Pt presents to PT with complaints of R plantar fascia and heel pain that hurts at rest and is increased with weight bearing. Limited DF ROM has resulted in poor biomechanics of ankle/foot joints and irritaiton to plantar fascia. Pt is only able to perofrm half of her work day without severe pain indicating decreased muscular endurance of surrounding tissues. pt will benefit from skilled PT in order to decrease pain  and return to PLOF. Pt was educated on importance of continuing to do these exercises for the long term to avoid return of this pain.    Rehab Potential Good   PT Frequency 2x / week   PT Duration 2 weeks   PT Treatment/Interventions ADLs/Self Care Home Management;Cryotherapy;Electrical Stimulation;Iontophoresis 4mg /ml  Dexamethasone;Functional mobility training;Stair training;Gait  training;Ultrasound;Moist Heat;Therapeutic activities;Therapeutic exercise;Balance training;Neuromuscular re-education;Patient/family education;Passive range of motion;Manual techniques;Dry needling;Taping   PT Next Visit Plan review stretches, check hip strength, challenge appropraite gait pattern through R foot   PT Home Exercise Plan inversion/eversion, gastroc & soleus stretch, great toe ext stretch, roll on frozen water bottle   Consulted and Agree with Plan of Care Patient      Patient will benefit from skilled therapeutic intervention in order to improve the following deficits and impairments:  Abnormal gait, Decreased range of motion, Difficulty walking, Increased fascial restricitons, Increased muscle spasms, Decreased activity tolerance, Pain, Impaired flexibility  Visit Diagnosis: Pain in right foot - Plan: PT plan of care cert/re-cert  Difficulty in walking, not elsewhere classified - Plan: PT plan of care cert/re-cert     Problem List Patient Active Problem List   Diagnosis Date Noted  . Seasonal allergies 01/21/2015  . OAB (overactive bladder) 01/21/2015  . Dyslipidemia 02/14/2014  . Obesity 12/19/2009  . LOW BACK PAIN 03/18/2008  . DEPRESSION/ANXIETY 06/15/2007  . HYPERTENSION, BENIGN 06/15/2007    Masashi Snowdon C. Jannae Fagerstrom PT, DPT 11/19/16 10:50 AM   [facility] [address] [place], [place], [address] Phone: [phone]   Fax:  [phone]  Name: Rachel Fields MRN: [id number] Date of Birth: 12/27/1955

## 2016-11-22 ENCOUNTER — Encounter: Payer: 59 | Admitting: Physical Therapy

## 2016-11-23 MED FILL — tiZANidine HCL 4 MG TABS: 4 | 20 days supply | Qty: 40 | Fill #0

## 2016-11-23 MED FILL — TOLTERODINE TART ER 4 MG CA: 4 | 30 days supply | Qty: 30 | Fill #3

## 2016-11-23 MED FILL — AMLODIPINE BESYLATE 5 MG TA: 5 | 90 days supply | Qty: 90 | Fill #1

## 2016-11-23 MED FILL — VENLAFAXINE HCL ER 75 MG CA: 75 | 30 days supply | Qty: 90 | Fill #4

## 2016-11-29 ENCOUNTER — Ambulatory Visit: Payer: 59 | Admitting: Physical Therapy

## 2016-11-30 ENCOUNTER — Ambulatory Visit: Payer: 59 | Admitting: Physical Therapy

## 2016-12-02 DIAGNOSIS — M25571 Pain in right ankle and joints of right foot: Secondary | ICD-10-CM | POA: Diagnosis not present

## 2016-12-03 ENCOUNTER — Encounter: Payer: Self-pay | Admitting: Physical Therapy

## 2016-12-03 ENCOUNTER — Ambulatory Visit: Payer: 59 | Admitting: Physical Therapy

## 2016-12-03 DIAGNOSIS — M79671 Pain in right foot: Secondary | ICD-10-CM

## 2016-12-03 DIAGNOSIS — R262 Difficulty in walking, not elsewhere classified: Secondary | ICD-10-CM | POA: Diagnosis not present

## 2016-12-03 NOTE — Therapy (Signed)
[place], [place], [address] Phone: [phone]   Fax:  [phone]  Physical Therapy Treatment  Patient Details  Name: Rachel Fields MRN: [id number] Date of Birth: [birth year] Referring Provider: Earlie Server, MD  Encounter Date: 12/03/2016      PT End of Session - 12/03/16 1110    Visit Number 2   Number of Visits 5   Date for PT Re-Evaluation 12/10/16   Authorization Type [facility] U[facility]   PT Start Time 1107  pt arrived late   PT Stop Time 1147   PT Time Calculation (min) 40 min   Activity Tolerance Patient tolerated treatment well   Behavior During Therapy [facility] for tasks assessed/performed      Past Medical History:  Diagnosis Date  . Allergy   . Anxiety   . Anxiety and depression   . Arthritis   . Depression   . HTN (hypertension)   . Hx of iron deficiency anemia    reports [place] trait; reports on iron her whole life  . Low back pain    chronic, reports hx DDD treated by chiropractor  . No blood products    Jehovah's Witness  . OAB (overactive bladder)   . Seasonal allergies   . Sickle cell trait ([place])   . Uterine fibroid     Past Surgical History:  Procedure Laterality Date  . BUNIONECTOMY Bilateral 1987  . CESAREAN SECTION    . TUBAL LIGATION    . TUMOR REMOVAL     behind left eye    There were no vitals filed for this visit.      Subjective Assessment - 12/03/16 1112    Subjective back at work and on feet. is having some pain but did not have excruciating pain like before. feels like ionto was very helpful.    Patient Stated Goals night throbbing, stand,    Currently in Pain? No/denies                         [facility] Adult PT Treatment/Exercise - 12/03/16 0001      Iontophoresis   Type of Iontophoresis Dexamethasone   Location R plantar fascia   Dose 1cc   Time 8 min  education setup and placement     Ankle Exercises: Aerobic   Stationary Bike nu step L5 6  min     Ankle Exercises: Stretches   Soleus Stretch Limitations long sitting with towel   Gastroc Stretch Limitations long sitting with towel   Other Stretch great toe extension      Ankle Exercises: Seated   Other Seated Ankle Exercises supine bridge with yellow tband for resisted eversion                PT Education - 12/03/16 1114    Education provided Yes   Education Details doing HEP before pain arises   Person(s) Educated Patient   Methods Explanation          PT Short Term Goals - 11/19/16 1046      PT SHORT TERM GOAL #1   Title Pt will verbalize pain <=3/10 while at work   Baseline severe pain at eval   Time 3   Period Weeks   Status New     PT SHORT TERM GOAL #2   Title Pt will verbalize ability to incorporate stretches into daily activities to avoid increasing pain   Baseline began educating at eval  Time 3   Period Weeks   Status New     PT SHORT TERM GOAL #3   Title Pt will demo proper heel toe gait pattern to decrease poor input to ankle/foot joints   Baseline antalgic and flat-foot gait at eval   Time 3   Period Weeks   Status New     PT SHORT TERM GOAL #4   Title Pt will not be woken at night by painful throbbing of foot   Baseline woken multiple times through the night at eval   Time 3   Period Weeks   Status New                  Plan - 12/03/16 1148    Clinical Impression Statement denied pain with exercises today, discussed exercises to perform at gym and form to avoid pain.    PT Next Visit Plan review stretches, check hip strength, challenge appropraite gait pattern through R foot   PT Home Exercise Plan inversion/eversion, gastroc & soleus stretch, great toe ext stretch, roll on frozen water bottle, bridge with eversion   Consulted and Agree with Plan of Care Patient      Patient will benefit from skilled therapeutic intervention in order to improve the following deficits and impairments:     Visit  Diagnosis: Pain in right foot  Difficulty in walking, not elsewhere classified     Problem List Patient Active Problem List   Diagnosis Date Noted  . Seasonal allergies 01/21/2015  . OAB (overactive bladder) 01/21/2015  . Dyslipidemia 02/14/2014  . Obesity 12/19/2009  . LOW BACK PAIN 03/18/2008  . DEPRESSION/ANXIETY 06/15/2007  . HYPERTENSION, BENIGN 06/15/2007    Easter Schinke C. Nocole Zammit PT, DPT 12/03/16 11:50 AM   [facility] [address] [place], [place], [address] Phone: [phone]   Fax:  [phone]  Name: Rachel Fields MRN: [id number] Date of Birth: 16-Mar-1956

## 2016-12-06 ENCOUNTER — Ambulatory Visit: Payer: 59 | Admitting: Physical Therapy

## 2016-12-07 ENCOUNTER — Ambulatory Visit: Payer: 59 | Admitting: Physical Therapy

## 2016-12-08 MED FILL — FLUTICASONE PROP 50 MCG SPR: 50 | 60 days supply | Qty: 16 | Fill #1

## 2016-12-13 ENCOUNTER — Encounter: Payer: 59 | Admitting: Sports Medicine

## 2016-12-13 ENCOUNTER — Encounter: Payer: 59 | Admitting: Physical Therapy

## 2016-12-14 ENCOUNTER — Telehealth: Payer: Self-pay | Admitting: Gastroenterology

## 2016-12-14 ENCOUNTER — Ambulatory Visit: Payer: 59 | Attending: Orthopedic Surgery | Admitting: Physical Therapy

## 2016-12-14 ENCOUNTER — Encounter: Payer: 59 | Admitting: Gastroenterology

## 2016-12-14 ENCOUNTER — Telehealth: Payer: Self-pay | Admitting: Physical Therapy

## 2016-12-14 DIAGNOSIS — R262 Difficulty in walking, not elsewhere classified: Secondary | ICD-10-CM | POA: Insufficient documentation

## 2016-12-14 DIAGNOSIS — M79671 Pain in right foot: Secondary | ICD-10-CM | POA: Insufficient documentation

## 2016-12-14 NOTE — Telephone Encounter (Signed)
Charge for [place] no show

## 2016-12-14 NOTE — Telephone Encounter (Signed)
Pt no show today for treatment, unable to leave message due to full mailbox. Chayah Mckee C. Alaila Pillard PT, DPT 12/14/16 1:28 PM

## 2016-12-20 ENCOUNTER — Encounter: Payer: 59 | Admitting: Physical Therapy

## 2016-12-21 ENCOUNTER — Telehealth: Payer: Self-pay | Admitting: Physical Therapy

## 2016-12-21 ENCOUNTER — Ambulatory Visit: Payer: 59 | Admitting: Physical Therapy

## 2016-12-21 ENCOUNTER — Encounter: Payer: 59 | Admitting: Sports Medicine

## 2016-12-21 NOTE — Telephone Encounter (Signed)
Left VM, this is the pt second NS and will have to make one appointment at a time. Needs re-eval with PT if she returns. Please contact us to say if she plans on continuing PT.  Isaak Delmundo C. Moishe Schellenberg PT, DPT 12/21/16 11:37 AM

## 2016-12-23 ENCOUNTER — Encounter: Payer: Self-pay | Admitting: Physical Therapy

## 2016-12-23 ENCOUNTER — Emergency Department (HOSPITAL_COMMUNITY): Payer: PRIVATE HEALTH INSURANCE

## 2016-12-23 ENCOUNTER — Encounter (HOSPITAL_COMMUNITY): Payer: Self-pay | Admitting: *Deleted

## 2016-12-23 ENCOUNTER — Ambulatory Visit: Payer: 59 | Admitting: Physical Therapy

## 2016-12-23 ENCOUNTER — Emergency Department (HOSPITAL_COMMUNITY)
Admission: EM | Admit: 2016-12-23 | Discharge: 2016-12-23 | Disposition: A | Discharge status: Home / Self Care | Payer: PRIVATE HEALTH INSURANCE | Attending: Emergency Medicine | Admitting: Emergency Medicine

## 2016-12-23 DIAGNOSIS — Y929 Unspecified place or not applicable: Secondary | ICD-10-CM | POA: Insufficient documentation

## 2016-12-23 DIAGNOSIS — Y9389 Activity, other specified: Secondary | ICD-10-CM | POA: Diagnosis not present

## 2016-12-23 DIAGNOSIS — W1809XA Striking against other object with subsequent fall, initial encounter: Secondary | ICD-10-CM | POA: Insufficient documentation

## 2016-12-23 DIAGNOSIS — S7001XA Contusion of right hip, initial encounter: Secondary | ICD-10-CM | POA: Diagnosis not present

## 2016-12-23 DIAGNOSIS — I10 Essential (primary) hypertension: Secondary | ICD-10-CM | POA: Insufficient documentation

## 2016-12-23 DIAGNOSIS — M79671 Pain in right foot: Secondary | ICD-10-CM

## 2016-12-23 DIAGNOSIS — R262 Difficulty in walking, not elsewhere classified: Secondary | ICD-10-CM

## 2016-12-23 DIAGNOSIS — Y990 Civilian activity done for income or pay: Secondary | ICD-10-CM | POA: Diagnosis not present

## 2016-12-23 DIAGNOSIS — Z79899 Other long term (current) drug therapy: Secondary | ICD-10-CM | POA: Diagnosis not present

## 2016-12-23 DIAGNOSIS — S79911A Unspecified injury of right hip, initial encounter: Secondary | ICD-10-CM | POA: Diagnosis present

## 2016-12-23 MED ORDER — IBUPROFEN 800 MG PO TABS: 800.0000 mg | ORAL_TABLET | Freq: Three times a day (TID) | ORAL | 0 refills | Status: DC

## 2016-12-23 MED ORDER — IBUPROFEN 800 MG PO TABS
800.0000 mg | ORAL_TABLET | Freq: Once | ORAL | Status: AC
  Administered 2016-12-23: 800 mg via ORAL
  Filled 2016-12-23: qty 1

## 2016-12-23 NOTE — Therapy (Signed)
[place], [place], [address] Phone: [phone]   Fax:  [phone]  Physical Therapy Treatment/Discharge summary  Patient Details  Name: Rachel Fields MRN: [id number] Date of Birth: 12-07-1955 Referring Provider: Earlie Server, MD  Encounter Date: 12/23/2016      PT End of Session - 12/23/16 1023    Visit Number 3   Number of Visits 5   PT Start Time 1024  pt arrived late   PT Stop Time 1048   PT Time Calculation (min) 24 min   Activity Tolerance Patient tolerated treatment well   Behavior During Therapy [facility] for tasks assessed/performed      Past Medical History:  Diagnosis Date  . Allergy   . Anxiety   . Anxiety and depression   . Arthritis   . Depression   . HTN (hypertension)   . Hx of iron deficiency anemia    reports [place] trait; reports on iron her whole life  . Low back pain    chronic, reports hx DDD treated by chiropractor  . No blood products    Jehovah's Witness  . OAB (overactive bladder)   . Seasonal allergies   . Sickle cell trait ([place])   . Uterine fibroid     Past Surgical History:  Procedure Laterality Date  . BUNIONECTOMY Bilateral 1987  . CESAREAN SECTION    . TUBAL LIGATION    . TUMOR REMOVAL     behind left eye    There were no vitals filed for this visit.      Subjective Assessment - 12/23/16 1024    Subjective reports feeling much better, is doing exercises.    Currently in Pain? No/denies   Aggravating Factors  long term standing   Pain Relieving Factors rest, ice, exercises            [facility] PT Assessment - 12/23/16 0001      Assessment   Medical Diagnosis R heel plantar fasciitis   Referring Provider Earlie Server, MD     Observation/Other Assessments   Focus on Therapeutic Outcomes (FOTO)  39% limitation     AROM   Right Ankle Dorsiflexion 6     PROM   Right Ankle Dorsiflexion 8     Strength   Overall Strength Comments no pain with  great toe flexion     Palpation   Palpation comment no TTP     Ambulation/Gait   Gait Comments gait pattern [facility]                     [facility] Adult PT Treatment/Exercise - 12/23/16 0001      Exercises   Exercises Other Exercises   Other Exercises  pt demo proper form with HEP stretches & exercises                PT Education - 12/23/16 1032    Education provided Yes   Education Details progress with goals, exercise form/rationale, HEP, staying ahead of pain rather than waiting for it to appear   Person(s) Educated Patient   Methods Explanation;Demonstration;Tactile cues;Verbal cues   Comprehension Verbalized understanding;Returned demonstration;Verbal cues required;Tactile cues required          PT Short Term Goals - 12/23/16 1027      PT SHORT TERM GOAL #1   Title Pt will verbalize pain <=3/10 while at work   Baseline reports up to 7/10 after quite a few hours, just a quick/sharp pain. otherwise  very minimal pain   Status Partially Met     PT SHORT TERM GOAL #2   Title Pt will verbalize ability to incorporate stretches into daily activities to avoid increasing pain   Baseline verbalized ability   Status Achieved     PT SHORT TERM GOAL #3   Title Pt will demo proper heel toe gait pattern to decrease poor input to ankle/foot joints   Baseline demo appropraite pattern   Status Achieved     PT SHORT TERM GOAL #4   Title Pt will not be woken at night by painful throbbing of foot   Baseline not woken by foot   Status Achieved                  Plan - 12/23/16 1032    Clinical Impression Statement Pt has met her goals and verbalized improvement in functional and work ability due to decreased pain. Verbalized comfort and understanding of HEP and is able to properly demo stretches and exercises. Pt was instructed to contact us with any further questions.    PT Home Exercise Plan inversion/eversion, gastroc & soleus stretch, great toe ext  stretch, roll on frozen water bottle, bridge with eversion   Consulted and Agree with Plan of Care Patient      Patient will benefit from skilled therapeutic intervention in order to improve the following deficits and impairments:     Visit Diagnosis: Difficulty in walking, not elsewhere classified - Plan: PT plan of care cert/re-cert  Pain in right foot - Plan: PT plan of care cert/re-cert     Problem List Patient Active Problem List   Diagnosis Date Noted  . Seasonal allergies 01/21/2015  . OAB (overactive bladder) 01/21/2015  . Dyslipidemia 02/14/2014  . Obesity 12/19/2009  . LOW BACK PAIN 03/18/2008  . DEPRESSION/ANXIETY 06/15/2007  . HYPERTENSION, BENIGN 06/15/2007   PHYSICAL THERAPY DISCHARGE SUMMARY  Visits from Start of Care: 3  Current functional level related to goals / functional outcomes: See above   Remaining deficits: See above   Education / Equipment: Anatomy of condition, POC, HEP, exercise form/rationale  Plan: Patient agrees to discharge.  Patient goals were met. Patient is being discharged due to meeting the stated rehab goals.  ?????   Rachel Fields PT, DPT 12/23/16 10:54 AM     [facility] [address] [place], [place], [address] Phone: [phone]   Fax:  [phone]  Name: Rachel Fields MRN: [id number] Date of Birth: [birth year]

## 2016-12-23 NOTE — ED Provider Notes (Signed)
[place] DEPT Provider Note   CSN: [id number] Arrival date & time: 12/23/16  1754     History   Chief Complaint Chief Complaint  Patient presents with  . Fall    HPI Rachel Fields is a 61 y.o. female.  The history is provided by the patient. No language interpreter was used.  Fall  This is a new problem. The current episode started 1 to 2 hours ago. The problem occurs constantly. The problem has been gradually worsening. Nothing aggravates the symptoms. Nothing relieves the symptoms. She has tried nothing for the symptoms. The treatment provided no relief.  Pt reports she turned her ankle at work and fell hitting her right hip.  Pt reports pain with walking.    Past Medical History:  Diagnosis Date  . Allergy   . Anxiety   . Anxiety and depression   . Arthritis   . Depression   . HTN (hypertension)   . Hx of iron deficiency anemia    reports  trait; reports on iron her whole life  . Low back pain    chronic, reports hx DDD treated by chiropractor  . No blood products    Jehovah's Witness  . OAB (overactive bladder)   . Seasonal allergies   . Sickle cell trait ([place])   . Uterine fibroid     Patient Active Problem List   Diagnosis Date Noted  . Seasonal allergies 01/21/2015  . OAB (overactive bladder) 01/21/2015  . Dyslipidemia 02/14/2014  . Obesity 12/19/2009  . LOW BACK PAIN 03/18/2008  . DEPRESSION/ANXIETY 06/15/2007  . HYPERTENSION, BENIGN 06/15/2007    Past Surgical History:  Procedure Laterality Date  . BUNIONECTOMY Bilateral 1987  . CESAREAN SECTION    . TUBAL LIGATION    . TUMOR REMOVAL     behind left eye    OB History    No data available       Home Medications    Prior to Admission medications   Medication Sig Start Date End Date Taking? Authorizing Provider  calcium-vitamin D (CALCIUM + D) 250-125 MG-UNIT per tablet Take 1 tablet by mouth daily. 02/14/14   Robbie Lis, MD  cyclobenzaprine (FLEXERIL) 5 MG tablet Take 1 tablet (5  mg total) by mouth at bedtime. Patient taking differently: Take 5 mg by mouth daily.  02/26/16   Lucretia Kern, DO  Fe Fum-FA-B Cmp-C-Zn-Mg-Mn-Cu (HEMOCYTE PLUS) 106-1 MG CAPS Take 1 capsule by mouth daily. 02/14/14   Robbie Lis, MD  fluticasone (FLONASE) 50 MCG/ACT nasal spray Place 1 spray into both nostrils at bedtime. Patient taking differently: Place 1 spray into both nostrils at bedtime as needed for allergies.  02/26/16   Lucretia Kern, DO  fluticasone (FLONASE) 50 MCG/ACT nasal spray Place 2 sprays into both nostrils daily. For 1 month, then 1 spray thereafter 03/11/16   Lucretia Kern, DO  ibuprofen (ADVIL,MOTRIN) 800 MG tablet Take 1 tablet (800 mg total) by mouth 3 (three) times daily. 12/23/16   Fransico Meadow, PA-C  lisinopril (PRINIVIL,ZESTRIL) 10 MG tablet Take 1 tablet (10 mg total) by mouth daily. 02/26/16   Lucretia Kern, DO  naproxen (NAPROSYN) 500 MG tablet 1 tablet 1-2 times daily as needed for pain 11/01/16   Lucretia Kern, DO  OVER THE COUNTER MEDICATION Take 2 tablets by mouth daily. Biotin 5048mcg    Historical Provider, MD  tolterodine (DETROL LA) 4 MG 24 hr capsule Take 1 capsule (4 mg total) by mouth daily.  02/26/16   Lucretia Kern, DO  venlafaxine XR (EFFEXOR-XR) 75 MG 24 hr capsule Take 3 capsules by mouth daily with breakfast 02/26/16   Lucretia Kern, DO    Family History Family History  Problem Relation Age of Onset  . Diabetes Father   . Hypertension Father   . Dementia Father     senile  . Other Mother     smoker  . Lung cancer Mother     2010  . Bipolar disorder Sister   . Schizophrenia Sister   . Schizophrenia Sister   . Bipolar disorder Sister   . Schizophrenia Sister   . Bipolar disorder Sister   . Bipolar disorder Daughter   . Colon cancer Neg Hx     Social History Social History  Substance Use Topics  . Smoking status: Never Smoker  . Smokeless tobacco: Never Used  . Alcohol use Yes     Comment: socially - rare; 2 drinks a few times per month      Allergies   Patient has no known allergies.   Review of Systems Review of Systems  All other systems reviewed and are negative.    Physical Exam Updated Vital Signs BP (!) 146/91   Pulse 88   Temp 98.6 F (37 C) (Oral)   Resp 16   Ht 5\' 7"  (1.702 m)   Wt 90.7 kg   SpO2 100%   BMI 31.32 kg/m   Physical Exam  Constitutional: She appears well-developed and well-nourished.  Musculoskeletal: She exhibits tenderness.  Tender right hip, pain with movement.  Ankle nontender no swelling.  ls spine nontender  Neurological: She is alert.  Skin: Skin is warm.  Psychiatric: She has a normal mood and affect.  Nursing note and vitals reviewed.    ED Treatments / Results  Labs (all labs ordered are listed, but only abnormal results are displayed) Labs Reviewed - No data to display  EKG  EKG Interpretation None       Radiology Dg Hip Unilat W Or Wo Pelvis 2-3 Views Right  Result Date: 12/23/2016 CLINICAL DATA:  Fall.  Right hip pain. EXAM: DG HIP (WITH OR WITHOUT PELVIS) 2-3V RIGHT COMPARISON:  None. FINDINGS: There is no evidence of hip fracture or dislocation. There is no evidence of arthropathy or other focal bone abnormality. IMPRESSION: 1. No acute findings.  No evidence for hip fracture. 2. If there is high clinical suspicion for occult fracture or the patient refuses to weightbear, consider further evaluation with MRI. Although CT is expeditious, evidence is lacking regarding accuracy of CT over plain film radiography. Electronically Signed   By: Kerby Moors M.D.   On: 12/23/2016 19:32    Procedures Procedures (including critical care time)  Medications Ordered in ED Medications  ibuprofen (ADVIL,MOTRIN) tablet 800 mg (800 mg Oral Given 12/23/16 1950)     Initial Impression / Assessment and Plan / ED Course  I have reviewed the triage vital signs and the nursing notes.  Pertinent labs & imaging results that were available during my care of the patient  were reviewed by me and considered in my medical decision making (see chart for details).     Pt able to tolerate weight bearing.  Pt advised to follow up with her Md for recheck if pain persist   Final Clinical Impressions(s) / ED Diagnoses   Final diagnoses:  Contusion of right hip, initial encounter    New Prescriptions Discharge Medication List as of 12/23/2016  7:48  PM     Meds ordered this encounter  Medications  . ibuprofen (ADVIL,MOTRIN) tablet 800 mg  . ibuprofen (ADVIL,MOTRIN) 800 MG tablet    Sig: Take 1 tablet (800 mg total) by mouth 3 (three) times daily.    Dispense:  21 tablet    Refill:  0    Order Specific Question:   Supervising Provider    Answer:   Tomi Bamberger, JON [[id number]]  An After Visit Summary was printed and given to the patient.    Hollace Kinnier [place], PA-C 12/23/16 [place], MD 12/23/16 [phone]

## 2016-12-23 NOTE — ED Triage Notes (Signed)
Pt turned left ankle and fell today while at work. Pt c/o pain to back that radiates into right buttocks. No LOC.

## 2016-12-23 NOTE — Discharge Instructions (Signed)
Follow up with your Physician for recheck in 2-3 days  

## 2016-12-23 NOTE — ED Notes (Signed)
Patient states she fell. Complaining of pain to right hip.

## 2016-12-29 MED FILL — TOLTERODINE TART ER 4 MG CA: 4 | 30 days supply | Qty: 30 | Fill #4

## 2016-12-29 MED FILL — tiZANidine HCL 4 MG TABS: 4 | 20 days supply | Qty: 40 | Fill #1

## 2016-12-29 MED FILL — VENLAFAXINE HCL ER 75 MG CA: 75 | 30 days supply | Qty: 90 | Fill #5

## 2016-12-29 MED FILL — LISINOPRIL 10 MG TABLET: 10 | 90 days supply | Qty: 90 | Fill #2

## 2017-01-04 ENCOUNTER — Ambulatory Visit (INDEPENDENT_AMBULATORY_CARE_PROVIDER_SITE_OTHER): Payer: 59 | Admitting: Family Medicine

## 2017-01-04 ENCOUNTER — Encounter: Payer: Self-pay | Admitting: Family Medicine

## 2017-01-04 VITALS — BP 138/88 | HR 85 | Temp 97.5°F | Ht 67.0 in | Wt 204.1 lb

## 2017-01-04 DIAGNOSIS — R739 Hyperglycemia, unspecified: Secondary | ICD-10-CM

## 2017-01-04 DIAGNOSIS — F3342 Major depressive disorder, recurrent, in full remission: Secondary | ICD-10-CM | POA: Diagnosis not present

## 2017-01-04 DIAGNOSIS — Z6831 Body mass index (BMI) 31.0-31.9, adult: Secondary | ICD-10-CM | POA: Diagnosis not present

## 2017-01-04 DIAGNOSIS — E785 Hyperlipidemia, unspecified: Secondary | ICD-10-CM

## 2017-01-04 DIAGNOSIS — I10 Essential (primary) hypertension: Secondary | ICD-10-CM | POA: Diagnosis not present

## 2017-01-04 DIAGNOSIS — N3281 Overactive bladder: Secondary | ICD-10-CM | POA: Diagnosis not present

## 2017-01-04 DIAGNOSIS — M5441 Lumbago with sciatica, right side: Secondary | ICD-10-CM | POA: Diagnosis not present

## 2017-01-04 LAB — BASIC METABOLIC PANEL
BUN: 13 mg/dL (ref 6–23)
CO2: 27 mEq/L (ref 19–32)
Calcium: 9.3 mg/dL (ref 8.4–10.5)
Chloride: 105 mEq/L (ref 96–112)
Creatinine, Ser: 0.91 mg/dL (ref 0.40–1.20)
GFR: 80.91 mL/min (ref >60.00)
Glucose, Bld: 120 mg/dL — ABNORMAL HIGH (ref 70–99)
Potassium: 3.8 mEq/L (ref 3.5–5.1)
Sodium: 141 mEq/L (ref 135–145)

## 2017-01-04 LAB — CBC
HCT: 37 % (ref 36.0–46.0)
Hemoglobin: 12.1 g/dL (ref 12.0–15.0)
MCHC: 32.6 g/dL (ref 30.0–36.0)
MCV: 76.2 fl — ABNORMAL LOW (ref 78.0–100.0)
Platelets: 267 10*3/uL (ref 150.0–400.0)
RBC: 4.85 Mil/uL (ref 3.87–5.11)
RDW: 15 % (ref 11.5–15.5)
WBC: 5.7 10*3/uL (ref 4.0–10.5)

## 2017-01-04 LAB — HEMOGLOBIN A1C: Hgb A1c MFr Bld: 6.2 % (ref 4.6–6.5)

## 2017-01-04 MED ORDER — CYCLOBENZAPRINE HCL 5 MG PO TABS: 5.0000 mg | ORAL_TABLET | Freq: Two times a day (BID) | ORAL | 0 refills | Status: DC | PRN

## 2017-01-04 MED ORDER — NAPROXEN 500 MG PO TABS: ORAL_TABLET | ORAL | 0 refills | Status: DC

## 2017-01-04 MED FILL — CYCLOBENZAPRINE 5 MG TABLET: 5 | 10 days supply | Qty: 20 | Fill #0

## 2017-01-04 NOTE — Patient Instructions (Addendum)
BEFORE YOU LEAVE: -labs -low back exercises -follow up:  1 month for blood pressure  See your specialist about the back pain. -in the interim can try the naproxen and the flexeril if needed for pain. Heat and topical sports creams can help as well.  -try to do the exercises 4 days per week  We have ordered labs or studies at this visit. It can take up to 1-2 weeks for results and processing. IF results require follow up or explanation, we will call you with instructions. Clinically stable results will be released to your [facility]. If you have not heard from [place] or cannot find your results in [facility] in 2 weeks please contact our office at [phone].  If you are not yet signed up for [facility], please consider signing up.   We recommend the following healthy lifestyle for LIFE: 1) Small portions.   Tip: eat off of a salad plate instead of a dinner plate.  Tip: It is ok to feel hungry after a meal.  Tip: if you need more or a snack choose fruits, veggies and/or a handful of nuts or seeds.  2) Eat a healthy clean diet.  * Tip: Avoid (less then 1 serving per week): processed foods, sweets, sweetened drinks, white starches (rice, flour, bread, potatoes, pasta, etc), red meat, fast foods, butter  *Tip: CHOOSE instead   * 5-9 servings per day of fresh or frozen fruits and vegetables (but not corn, potatoes, bananas, canned or dried fruit)   *nuts and seeds, beans   *olives and olive oil   *small portions of lean meats such as fish and white chicken    *small portions of whole grains  3)Get at least 150 minutes of sweaty aerobic exercise per week.  4)Reduce stress - consider counseling, meditation and relaxation to balance other aspects of your life.

## 2017-01-04 NOTE — Progress Notes (Signed)
Pre visit review using our clinic review tool, if applicable. No additional management support is needed unless otherwise documented below in the visit note. 

## 2017-01-04 NOTE — Progress Notes (Signed)
HPI:  Acute visit for R low back pain -hx of the same and sees Dr. Cyd Silence and [facility] ortho for this -reports PT helped last time this happened -this flare started 1 week ago -pain is mod-severe, R low back radiating to R buttock and R upper post leg -worse with sitting for a long time, better with moving and with ibuprofen -no weakness, numbness, fevers, malaise, bowel or bladder function changes -s/p eval in Emergency Room 12/23/16 for R hip pain s/p fall -  neg plain films; reports this improved on its own  Other chronic medical problems include hypertension, Anxiety and depression. Due for labs. Took BP med this am. Mood ok. No CP, SOB, DOE.  ROS: See pertinent positives and negatives per HPI.  Past Medical History:  Diagnosis Date  . Allergy   . Anxiety   . Anxiety and depression   . Arthritis   . Depression   . HTN (hypertension)   . Hx of iron deficiency anemia    reports [place] trait; reports on iron her whole life  . Low back pain    chronic, reports hx DDD treated by chiropractor  . No blood products    Jehovah's Witness  . OAB (overactive bladder)   . Seasonal allergies   . Sickle cell trait ([place])   . Uterine fibroid     Past Surgical History:  Procedure Laterality Date  . BUNIONECTOMY Bilateral 1987  . CESAREAN SECTION    . TUBAL LIGATION    . TUMOR REMOVAL     behind left eye    Family History  Problem Relation Age of Onset  . Diabetes Father   . Hypertension Father   . Dementia Father     senile  . Other Mother     smoker  . Lung cancer Mother     2010  . Bipolar disorder Sister   . Schizophrenia Sister   . Schizophrenia Sister   . Bipolar disorder Sister   . Schizophrenia Sister   . Bipolar disorder Sister   . Bipolar disorder Daughter   . Colon cancer Neg Hx     Social History   Social History  . Marital status: Single    Spouse name: N/A  . Number of children: N/A  . Years of education: N/A   Social History Main Topics  .  Smoking status: Never Smoker  . Smokeless tobacco: Never Used  . Alcohol use Yes     Comment: socially - rare; 2 drinks a few times per month  . Drug use: No  . Sexual activity: Not Asked   Other Topics Concern  . None   Social History Narrative   Work or School: Quarry manager, working on Paediatric nurse Situation: Lives with husband - married in 2015; hx of homelessness in 2015      Spiritual Beliefs: Jehovah's Witness      Lifestyle: no regular CV exercise; diet improving - reports she has been able to loose weight                       Current Outpatient Prescriptions:  .  calcium-vitamin D (CALCIUM + D) 250-125 MG-UNIT per tablet, Take 1 tablet by mouth daily., Disp: 30 tablet, Rfl: 3 .  Fe Fum-FA-B Cmp-C-Zn-Mg-Mn-Cu (HEMOCYTE PLUS) 106-1 MG CAPS, Take 1 capsule by mouth daily., Disp: 30 each, Rfl: 3 .  fluticasone (FLONASE) 50 MCG/ACT nasal spray, Place 1 spray into both nostrils  at bedtime. (Patient taking differently: Place 1 spray into both nostrils at bedtime as needed for allergies. ), Disp: 16 g, Rfl: 3 .  fluticasone (FLONASE) 50 MCG/ACT nasal spray, Place 2 sprays into both nostrils daily. For 1 month, then 1 spray thereafter, Disp: 16 g, Rfl: 6 .  lisinopril (PRINIVIL,ZESTRIL) 10 MG tablet, Take 1 tablet (10 mg total) by mouth daily., Disp: 90 tablet, Rfl: 3 .  naproxen (NAPROSYN) 500 MG tablet, 1 tablet 1-2 times daily as needed for pain, Disp: 14 tablet, Rfl: 0 .  OVER THE COUNTER MEDICATION, Take 2 tablets by mouth daily. Biotin 5041mcg, Disp: , Rfl:  .  tolterodine (DETROL LA) 4 MG 24 hr capsule, Take 1 capsule (4 mg total) by mouth daily., Disp: 90 capsule, Rfl: 3 .  venlafaxine XR (EFFEXOR-XR) 75 MG 24 hr capsule, Take 3 capsules by mouth daily with breakfast, Disp: 270 capsule, Rfl: 3 .  cyclobenzaprine (FLEXERIL) 5 MG tablet, Take 1 tablet (5 mg total) by mouth 2 (two) times daily as needed for muscle spasms., Disp: 20 tablet, Rfl: 0  EXAM:  Vitals:    01/04/17 1307  BP: 138/88  Pulse: 85  Temp: 97.5 F (36.4 C)    Body mass index is 31.97 kg/m.  GENERAL: vitals reviewed and listed above, alert, oriented, appears well hydrated and in no acute distress  HEENT: atraumatic, conjunttiva clear, no obvious abnormalities on inspection of external nose and ears  NECK: no obvious masses on inspection  LUNGS: clear to auscultation bilaterally, no wheezes, rales or rhonchi, good air movement  CV: HRRR, no peripheral edema  MS: moves all extremities without noticeable abnormality Antalgic gait but bearing weight Normal inspection of back, no obvious scoliosis or leg length descrepancy No bony TTP Soft tissue TTP at: R lumbar paraspinal muscles, glutes difusely, IT band and upp hamstrings -/+ tests: neg trendelenburg,-facet loading, -SLRT, -CLRT, -FABER, -FADIR Normal muscle strength, sensation to light touch and DTRs in LEs bilaterally  PSYCH: pleasant and cooperative, no obvious depression or anxiety  ASSESSMENT AND PLAN:  Discussed the following assessment and plan:  Acute right-sided low back pain with right-sided sciatica -she has appt with her specialist in 2 days -no neurodeficits on exam, symptoms seem radicular, but some muscular TTP in multiple muscle groups, will do naproxen and flexeril in interim and HEP provided as she feels moving helps -she opted to hold off on imaging and do with specialist if needed -return/emergency precautions  HYPERTENSION, BENIGN - Plan: Basic metabolic panel, CBC -up a little, likely due to pain, labs and follow up in 1 month to recheck when feeling better  OAB (overactive bladder) -cont current tx  Dyslipidemia BMI 31.0-31.9,adult Hyperglycemia - Plan: Hemoglobin A1c -labs, lifestyle recs  Depression: -cont current tx  -Patient advised to return or notify a doctor immediately if symptoms worsen or persist or new concerns arise.  Patient Instructions  BEFORE YOU LEAVE: -labs -low  back exercises -follow up:  1 month for blood pressure  See your specialist about the back pain. -in the interim can try the naproxen and the flexeril if needed for pain. Heat and topical sports creams can help as well.  -try to do the exercises 4 days per week  We have ordered labs or studies at this visit. It can take up to 1-2 weeks for results and processing. IF results require follow up or explanation, we will call you with instructions. Clinically stable results will be released to your [facility]. If you have  not heard from [place] or cannot find your results in [facility] in 2 weeks please contact our office at [phone].  If you are not yet signed up for [facility], please consider signing up.   We recommend the following healthy lifestyle for LIFE: 1) Small portions.   Tip: eat off of a salad plate instead of a dinner plate.  Tip: It is ok to feel hungry after a meal.  Tip: if you need more or a snack choose fruits, veggies and/or a handful of nuts or seeds.  2) Eat a healthy clean diet.  * Tip: Avoid (less then 1 serving per week): processed foods, sweets, sweetened drinks, white starches (rice, flour, bread, potatoes, pasta, etc), red meat, fast foods, butter  *Tip: CHOOSE instead   * 5-9 servings per day of fresh or frozen fruits and vegetables (but not corn, potatoes, bananas, canned or dried fruit)   *nuts and seeds, beans   *olives and olive oil   *small portions of lean meats such as fish and white chicken    *small portions of whole grains  3)Get at least 150 minutes of sweaty aerobic exercise per week.  4)Reduce stress - consider counseling, meditation and relaxation to balance other aspects of your life.          Colin Benton R., DO

## 2017-01-06 DIAGNOSIS — M5136 Other intervertebral disc degeneration, lumbar region: Secondary | ICD-10-CM | POA: Diagnosis not present

## 2017-01-06 MED FILL — predniSONE 10 MG TABS: 10 | 12 days supply | Qty: 30 | Fill #0

## 2017-01-11 ENCOUNTER — Ambulatory Visit: Payer: 59 | Attending: Orthopedic Surgery | Admitting: Rehabilitative and Restorative Service Providers"

## 2017-01-11 DIAGNOSIS — M5441 Lumbago with sciatica, right side: Secondary | ICD-10-CM | POA: Diagnosis not present

## 2017-01-11 DIAGNOSIS — R262 Difficulty in walking, not elsewhere classified: Secondary | ICD-10-CM | POA: Insufficient documentation

## 2017-01-11 DIAGNOSIS — M79671 Pain in right foot: Secondary | ICD-10-CM | POA: Diagnosis not present

## 2017-01-11 DIAGNOSIS — R293 Abnormal posture: Secondary | ICD-10-CM | POA: Insufficient documentation

## 2017-01-11 NOTE — Therapy (Signed)
[place], [place], [address] Phone: [phone]   Fax:  [phone]  Physical Therapy Evaluation  Patient Details  Name: Rachel Fields MRN: [id number] Date of Birth: 1956-05-18 Referring Provider: Dr. Layne Benton  Encounter Date: 01/11/2017      PT End of Session - 01/11/17 1146    Visit Number 1   Number of Visits 18   Date for PT Re-Evaluation 02/22/17   Authorization Type [facility]   PT Start Time 5852   PT Stop Time 1033   PT Time Calculation (min) 51 min   Activity Tolerance Patient tolerated treatment well;No increased pain;Patient limited by pain   Behavior During Therapy [facility] for tasks assessed/performed      Past Medical History:  Diagnosis Date  . Allergy   . Anxiety   . Anxiety and depression   . Arthritis   . Depression   . HTN (hypertension)   . Hx of iron deficiency anemia    reports [place] trait; reports on iron her whole life  . Low back pain    chronic, reports hx DDD treated by chiropractor  . No blood products    Jehovah's Witness  . OAB (overactive bladder)   . Seasonal allergies   . Sickle cell trait ([place])   . Uterine fibroid     Past Surgical History:  Procedure Laterality Date  . BUNIONECTOMY Bilateral 1987  . CESAREAN SECTION    . TUBAL LIGATION    . TUMOR REMOVAL     behind left eye    There were no vitals filed for this visit.       Subjective Assessment - 01/11/17 1042    Subjective "I had a fall at work 12/22/16 and slipped a disc. all of my pain is on the right side from my back down to my ankle."   Pertinent History Pt reports a fall 12/22/16 at work where she fell. XRay of her R hip is -. She reports an lower lumbar spine disc slippage. Meds were issued which are helping but limiting function. Pt states she cannot put pressure on her R leg with transfers until she is all the way standing. Pt is a CNA with [employer]   Limitations Sitting;Lifting;Standing;Walking;House  hold activities   How long can you sit comfortably? < 1 min   How long can you stand comfortably? < 1 min   How long can you walk comfortably? about 4 minutes   Diagnostic tests Xray -   Patient Stated Goals to be able to perform ADLs without pain and return to work   Currently in Pain? Yes   Pain Score 7    Pain Location Hip   Pain Orientation Right   Pain Descriptors / Indicators Aching;Constant;Guarding;Shooting   Pain Type Acute pain   Pain Radiating Towards R ankle from R low back   Pain Onset 1 to 4 weeks ago   Pain Frequency Constant   Aggravating Factors  sitting, standing   Pain Relieving Factors heat, meds   Effect of Pain on Daily Activities pt is unable to do all activities due to pain   Multiple Pain Sites No            [facility] PT Assessment - 01/11/17 0001      Assessment   Medical Diagnosis R LBP, R sciatica, R gluteal pain   Referring Provider Dr. Layne Benton   Onset Date/Surgical Date 12/22/16   Prior Therapy 12/23/16 for R foot pain  Precautions   Precautions None     Restrictions   Weight Bearing Restrictions No     Balance Screen   Has the patient fallen in the past 6 months Yes   How many times? 1   Has the patient had a decrease in activity level because of a fear of falling?  No   Is the patient reluctant to leave their home because of a fear of falling?  Yes  because of pain     [place] residence     Prior Function   Level of Independence Independent  with pain     Cognition   Overall Cognitive Status Within Functional Limits for tasks assessed     Observation/Other Assessments   Focus on Therapeutic Outcomes (FOTO)  75% limitation     Sensation   Light Touch Appears Intact     Coordination   Gross Motor Movements are Fluid and Coordinated Yes     Functional Tests   Functional tests --  repeated flex/ext test with ext preference; flex incr pain     Posture/Postural Control    Posture/Postural Control --  very guarded R side; neutral spine posture but lumbar limit      ROM / Strength   AROM / PROM / Strength AROM;PROM;Strength     AROM   Overall AROM  --  lumbar AROM WFL but with pain with L-flexion     PROM   Overall PROM Comments hip PROM WNL but with pain with Hip mobility     Strength   Overall Strength Comments R LE strength 4-/5 limited by pain; L LE 4+-5/5     Flexibility   Soft Tissue Assessment /Muscle Length --  90-90 SLR normal bil     Palpation   SI assessment  unable to assess due to R glute spasm   Palpation comment glute tight and tender to palpation; obvious spasms noted; L4/5 hypomobility     Special Tests    Special Tests --  SLR - for radicular sxs; R figure 4 + for hip pain     Transfers   Comments pt noted to not fully WB on R LE with transfers     Ambulation/Gait   Gait Pattern --  decreased WB on R LE noted                             PT Short Term Goals - 01/11/17 1141      PT SHORT TERM GOAL #1   Title Pt will be I with initial HEP   Baseline not issued   Time 3   Period Weeks   Status New     PT SHORT TERM GOAL #2   Title Pt will report 25% reduced R LE radicular sxs to ankle   Baseline unmet   Time 3   Period Weeks   Status New     PT SHORT TERM GOAL #3   Title pt will report being able to sit x 15 minutes to eat dinner without increased symptoms   Baseline <1 minute   Time 3   Period Weeks   Status New           PT Long Term Goals - 01/11/17 1143      PT LONG TERM GOAL #1   Title Pt will be I with advanced HEP   Baseline unmet   Time 6  Period Weeks   Status New     PT LONG TERM GOAL #2   Title Pt will report overall LBP </= 3/10 with all functional activities   Baseline 7/10   Time 6   Period Weeks   Status New     PT LONG TERM GOAL #3   Title Pt will report decreased R LE radicular sxs x 75% with all functional activities   Baseline unmet   Time 6    Period Weeks   Status New     PT LONG TERM GOAL #4   Title Pt will be able to sit x 1 hour with 3/10 LBP to watch a movie   Baseline unmet   Time 6   Period Weeks   Status New               Plan - 01/11/17 1137    Clinical Impression Statement Pt demonstrates R lumbar/gluteal muscle spasms with reports of radiation from R lumbar spine approx L4-5 extending to ankle. Repeated flex/ext do not reproduce symptoms but pt does have an extension preference. R hip PROM WFL but with discomfort in R glute.   Rehab Potential Good   PT Frequency 3x / week   PT Duration 6 weeks   PT Treatment/Interventions ADLs/Self Care Home Management;Cryotherapy;Electrical Stimulation;Iontophoresis 4mg /ml Dexamethasone;Moist Heat;Traction;Ultrasound;Functional mobility training;Therapeutic activities;Therapeutic exercise;Patient/family education;Passive range of motion;Manual techniques;Dry needling;Taping   PT Next Visit Plan Issue HEP; [place] to R glute, lumbar flexibility exercises, begin McKenzie exercises, traction?   PT Home Exercise Plan pt instruction issued; see pt instruction section   Consulted and Agree with Plan of Care Patient      Patient will benefit from skilled therapeutic intervention in order to improve the following deficits and impairments:  Decreased mobility, Decreased strength, Hypomobility, Difficulty walking, Increased muscle spasms, Impaired flexibility, Improper body mechanics  Visit Diagnosis: Acute right-sided low back pain with right-sided sciatica  Difficulty in walking, not elsewhere classified  Abnormal posture     Problem List Patient Active Problem List   Diagnosis Date Noted  . Seasonal allergies 01/21/2015  . OAB (overactive bladder) 01/21/2015  . Dyslipidemia 02/14/2014  . Obesity 12/19/2009  . LOW BACK PAIN 03/18/2008  . DEPRESSION/ANXIETY 06/15/2007  . HYPERTENSION, BENIGN 06/15/2007    Myra Rude, PT 01/11/2017, 11:49 AM  [facility] [address] [place], [place], [address] Phone: [phone]   Fax:  [phone]  Name: Rachel Fields MRN: [id number] Date of Birth: 1955/11/13

## 2017-01-11 NOTE — Patient Instructions (Signed)
Discussed with pt symptoms of disc herniation/slippage, importance of postural awareness at all times with sitting, standing, and sex per pt request, McKenzie exercise illustration to assist with disc re-correction.

## 2017-01-14 HISTORY — PX: OTHER SURGICAL HISTORY: SHX169

## 2017-01-17 MED FILL — NAPROXEN SODIUM 550 MG TAB: 550 | 6 days supply | Qty: 12 | Fill #0

## 2017-01-17 MED FILL — CHLORHEXIDINE 0.12% RINSE: 0.12 | 17 days supply | Qty: 473 | Fill #0

## 2017-01-18 ENCOUNTER — Ambulatory Visit: Payer: 59 | Admitting: Physical Therapy

## 2017-01-18 ENCOUNTER — Encounter: Payer: Self-pay | Admitting: Physical Therapy

## 2017-01-18 DIAGNOSIS — R293 Abnormal posture: Secondary | ICD-10-CM

## 2017-01-18 DIAGNOSIS — M5441 Lumbago with sciatica, right side: Secondary | ICD-10-CM | POA: Diagnosis not present

## 2017-01-18 DIAGNOSIS — R262 Difficulty in walking, not elsewhere classified: Secondary | ICD-10-CM | POA: Diagnosis not present

## 2017-01-18 DIAGNOSIS — M79671 Pain in right foot: Secondary | ICD-10-CM | POA: Diagnosis not present

## 2017-01-18 NOTE — Therapy (Signed)
[place], [place], [address] Phone: [phone]   Fax:  [phone]  Physical Therapy Treatment  Patient Details  Name: Rachel Fields MRN: [id number] Date of Birth: 23-Dec-1955 Referring Provider: Dr. Layne Benton  Encounter Date: 01/18/2017      PT End of Session - 01/18/17 1501    Visit Number 2   Number of Visits 18   Date for PT Re-Evaluation 02/22/17   PT Start Time 1027   PT Stop Time 1515   PT Time Calculation (min) 58 min   Activity Tolerance Patient tolerated treatment well   Behavior During Therapy [facility] for tasks assessed/performed      Past Medical History:  Diagnosis Date  . Allergy   . Anxiety   . Anxiety and depression   . Arthritis   . Depression   . HTN (hypertension)   . Hx of iron deficiency anemia    reports [place] trait; reports on iron her whole life  . Low back pain    chronic, reports hx DDD treated by chiropractor  . No blood products    Jehovah's Witness  . OAB (overactive bladder)   . Seasonal allergies   . Sickle cell trait ([place])   . Uterine fibroid     Past Surgical History:  Procedure Laterality Date  . BUNIONECTOMY Bilateral 1987  . CESAREAN SECTION    . Teeth extracted   01/14/2017  . TUBAL LIGATION    . TUMOR REMOVAL     behind left eye    There were no vitals filed for this visit.      Subjective Assessment - 01/18/17 1420    Subjective had mouth surgery 01/14/2017.  Better with medications.   Currently in Pain? Yes   Pain Score 5    Pain Location Hip   Pain Orientation Right   Pain Descriptors / Indicators Aching;Constant;Shooting   Pain Radiating Towards r ankle,  right low back   Pain Frequency Constant   Aggravating Factors  sitting, standing   Pain Relieving Factors Medication, (Meds for mouth)   Effect of Pain on Daily Activities Patient is unable to do all activities due to pain                         [facility] Adult PT  Treatment/Exercise - 01/18/17 0001      Self-Care   Self-Care --  QL info,  scoot to edge and side for sit to stand.      Lumbar Exercises: Stretches   Standing Extension 1 rep;60 seconds   Prone on Elbows Stretch 1 rep;60 seconds   Prone Mid Back Stretch Limitations QL stretch over pillow with arm overhead     Modalities   Modalities Moist Heat     Moist Heat Therapy   Number Minutes Moist Heat 15 Minutes   Moist Heat Location Lumbar Spine     Ultrasound   Ultrasound Location --  gluteal and QL right   Ultrasound Parameters 100%, 1.5 watts/cm2   Ultrasound Goals Pain                PT Education - 01/18/17 1500    Education provided Yes   Education Details HEP,  QL info   Person(s) Educated Patient   Methods Explanation;Demonstration;Verbal cues;Handout   Comprehension Verbalized understanding;Returned demonstration          PT Short Term Goals - 01/11/17 1141      PT SHORT  TERM GOAL #1   Title Pt will be I with initial HEP   Baseline not issued   Time 3   Period Weeks   Status New     PT SHORT TERM GOAL #2   Title Pt will report 25% reduced R LE radicular sxs to ankle   Baseline unmet   Time 3   Period Weeks   Status New     PT SHORT TERM GOAL #3   Title pt will report being able to sit x 15 minutes to eat dinner without increased symptoms   Baseline <1 minute   Time 3   Period Weeks   Status New           PT Long Term Goals - 01/11/17 1143      PT LONG TERM GOAL #1   Title Pt will be I with advanced HEP   Baseline unmet   Time 6   Period Weeks   Status New     PT LONG TERM GOAL #2   Title Pt will report overall LBP </= 3/10 with all functional activities   Baseline 7/10   Time 6   Period Weeks   Status New     PT LONG TERM GOAL #3   Title Pt will report decreased R LE radicular sxs x 75% with all functional activities   Baseline unmet   Time 6   Period Weeks   Status New     PT LONG TERM GOAL #4   Title Pt will be able  to sit x 1 hour with 3/10 LBP to watch a movie   Baseline unmet   Time 6   Period Weeks   Status New               Plan - 01/18/17 1502    Clinical Impression Statement Progress toward HEP goal.  Leg pain not in ankle as long as she takes pain meds. Patient recovering form mouth surgery.  Less Pain apost session.   PT Next Visit Plan Issue HEP; [place] to R glute, lumbar flexibility exercises, begin McKenzie exercises, traction?   PT Home Exercise Plan pt instruction issued; see pt instruction section   Consulted and Agree with Plan of Care Patient      Patient will benefit from skilled therapeutic intervention in order to improve the following deficits and impairments:  Decreased mobility, Decreased strength, Hypomobility, Difficulty walking, Increased muscle spasms, Impaired flexibility, Improper body mechanics  Visit Diagnosis: Acute right-sided low back pain with right-sided sciatica  Difficulty in walking, not elsewhere classified  Abnormal posture     Problem List Patient Active Problem List   Diagnosis Date Noted  . Seasonal allergies 01/21/2015  . OAB (overactive bladder) 01/21/2015  . Dyslipidemia 02/14/2014  . Obesity 12/19/2009  . LOW BACK PAIN 03/18/2008  . DEPRESSION/ANXIETY 06/15/2007  . HYPERTENSION, BENIGN 06/15/2007    Deazia Lampi PTA 01/18/2017, 5:41 PM  [facility] [address] [place], [place], [address] Phone: [phone]   Fax:  [phone]  Name: Alaine Loughney MRN: [id number] Date of Birth: 1955/12/31

## 2017-01-18 NOTE — Patient Instructions (Signed)
QL info and side stretch issued from Exercise drawer.  1-2 x a day 30 seconds to 3 minutes

## 2017-01-20 ENCOUNTER — Ambulatory Visit: Payer: 59 | Admitting: Physical Therapy

## 2017-01-20 ENCOUNTER — Encounter: Payer: Self-pay | Admitting: Physical Therapy

## 2017-01-20 DIAGNOSIS — M79671 Pain in right foot: Secondary | ICD-10-CM | POA: Diagnosis not present

## 2017-01-20 DIAGNOSIS — R262 Difficulty in walking, not elsewhere classified: Secondary | ICD-10-CM | POA: Diagnosis not present

## 2017-01-20 DIAGNOSIS — M5441 Lumbago with sciatica, right side: Secondary | ICD-10-CM | POA: Diagnosis not present

## 2017-01-20 DIAGNOSIS — R293 Abnormal posture: Secondary | ICD-10-CM | POA: Diagnosis not present

## 2017-01-20 NOTE — Therapy (Signed)
[place], [place], [address] Phone: [phone]   Fax:  [phone]  Physical Therapy Treatment  Patient Details  Name: Rachel Fields MRN: [id number] Date of Birth: 1956/06/09 Referring Provider: Dr. Layne Benton  Encounter Date: 01/20/2017      PT End of Session - 01/20/17 0929    Visit Number 3   Number of Visits 18   Date for PT Re-Evaluation 02/22/17   PT Start Time 0849   PT Stop Time 0945   PT Time Calculation (min) 56 min   Activity Tolerance Patient tolerated treatment well   Behavior During Therapy [facility] for tasks assessed/performed      Past Medical History:  Diagnosis Date  . Allergy   . Anxiety   . Anxiety and depression   . Arthritis   . Depression   . HTN (hypertension)   . Hx of iron deficiency anemia    reports [place] trait; reports on iron her whole life  . Low back pain    chronic, reports hx DDD treated by chiropractor  . No blood products    Jehovah's Witness  . OAB (overactive bladder)   . Seasonal allergies   . Sickle cell trait ([place])   . Uterine fibroid     Past Surgical History:  Procedure Laterality Date  . BUNIONECTOMY Bilateral 1987  . CESAREAN SECTION    . Teeth extracted   01/14/2017  . TUBAL LIGATION    . TUMOR REMOVAL     behind left eye    There were no vitals filed for this visit.      Subjective Assessment - 01/20/17 0851    Subjective Mouth pain 3-4/10.   [place] helped ,  Medication for mouth pain helps other pain.   Currently in Pain? No/denies   Pain Location Hip   Pain Orientation Right   Pain Relieving Factors Medication for mouth pain helps hip pain                         [facility] Adult PT Treatment/Exercise - 01/20/17 0001      Lumbar Exercises: Stretches   Lower Trunk Rotation 2 reps   Lower Trunk Rotation Limitations crampingright low back   Pelvic Tilt 5 reps   Piriformis Stretch 1 rep;60 seconds     Lumbar Exercises: Supine    Clam 5 reps  single and both with abdominals, monitoring   Clam Limitations cues for abdominal engage  3 sets     Lumbar Exercises: Sidelying   Clam 5 reps  Right,  small movements     Lumbar Exercises: Prone   Other Prone Lumbar Exercises Multifitus press 5 X each: press,  knee flexion, hip extension monitored for contraction  over pillow     Modalities   Modalities Moist Heat     Moist Heat Therapy   Number Minutes Moist Heat 15 Minutes   Moist Heat Location Lumbar Spine     Manual Therapy   Manual Therapy Soft tissue mobilization   Soft tissue mobilization Right hip, lateral thigh  Gluteal, instrument assist intermitantly,  Tissue softened                  PT Short Term Goals - 01/20/17 0936      PT SHORT TERM GOAL #1   Title Pt will be I with initial HEP   Time 3   Period Weeks   Status Unable to assess  PT SHORT TERM GOAL #2   Title Pt will report 25% reduced R LE radicular sxs to ankle   Baseline reduced due to meds   Time 3   Period Weeks   Status Unable to assess     PT SHORT TERM GOAL #3   Title pt will report being able to sit x 15 minutes to eat dinner without increased symptoms   Time 3   Period Weeks   Status Unable to assess           PT Long Term Goals - 01/11/17 1143      PT LONG TERM GOAL #1   Title Pt will be I with advanced HEP   Baseline unmet   Time 6   Period Weeks   Status New     PT LONG TERM GOAL #2   Title Pt will report overall LBP </= 3/10 with all functional activities   Baseline 7/10   Time 6   Period Weeks   Status New     PT LONG TERM GOAL #3   Title Pt will report decreased R LE radicular sxs x 75% with all functional activities   Baseline unmet   Time 6   Period Weeks   Status New     PT LONG TERM GOAL #4   Title Pt will be able to sit x 1 hour with 3/10 LBP to watch a movie   Baseline unmet   Time 6   Period Weeks   Status New               Plan - 01/20/17 0240    Clinical  Impression Statement No pain initially (Meds).  4/10 with exercises.  Tolerated prone exercises well.  Less guarding with bed mobility noted.   PT Next Visit Plan Issue HEP; [place] to R glute, lumbar flexibility exercises, begin McKenzie exercises, traction?  Consider Mulitfitus ex for HEP.   PT Home Exercise Plan pt instruction issued; see pt instruction section   Consulted and Agree with Plan of Care Patient      Patient will benefit from skilled therapeutic intervention in order to improve the following deficits and impairments:  Decreased mobility, Decreased strength, Hypomobility, Difficulty walking, Increased muscle spasms, Impaired flexibility, Improper body mechanics  Visit Diagnosis: Acute right-sided low back pain with right-sided sciatica  Difficulty in walking, not elsewhere classified  Abnormal posture  Pain in right foot     Problem List Patient Active Problem List   Diagnosis Date Noted  . Seasonal allergies 01/21/2015  . OAB (overactive bladder) 01/21/2015  . Dyslipidemia 02/14/2014  . Obesity 12/19/2009  . LOW BACK PAIN 03/18/2008  . DEPRESSION/ANXIETY 06/15/2007  . HYPERTENSION, BENIGN 06/15/2007    HARRIS,KAREN PTA 01/20/2017, 9:38 AM  [facility] [address] [place], [place], [address] Phone: [phone]   Fax:  [phone]  Name: Rachel Fields MRN: [id number] Date of Birth: [birth year]

## 2017-01-25 ENCOUNTER — Encounter: Payer: Self-pay | Admitting: Physical Therapy

## 2017-01-25 ENCOUNTER — Ambulatory Visit: Payer: 59 | Admitting: Physical Therapy

## 2017-01-25 DIAGNOSIS — M79671 Pain in right foot: Secondary | ICD-10-CM | POA: Diagnosis not present

## 2017-01-25 DIAGNOSIS — M5441 Lumbago with sciatica, right side: Secondary | ICD-10-CM

## 2017-01-25 DIAGNOSIS — R262 Difficulty in walking, not elsewhere classified: Secondary | ICD-10-CM | POA: Diagnosis not present

## 2017-01-25 DIAGNOSIS — R293 Abnormal posture: Secondary | ICD-10-CM

## 2017-01-25 NOTE — Therapy (Signed)
[place], [place], [address] Phone: [phone]   Fax:  [phone]  Physical Therapy Treatment  Patient Details  Name: Rachel Fields MRN: [id number] Date of Birth: 02-Aug-1956 Referring Provider: Dr. Layne Benton  Encounter Date: 01/25/2017      PT End of Session - 01/25/17 1214    Visit Number 4   Number of Visits 18   Date for PT Re-Evaluation 02/22/17   PT Start Time 1108   PT Stop Time 1210   PT Time Calculation (min) 62 min   Activity Tolerance Patient tolerated treatment well   Behavior During Therapy [facility] for tasks assessed/performed      Past Medical History:  Diagnosis Date  . Allergy   . Anxiety   . Anxiety and depression   . Arthritis   . Depression   . HTN (hypertension)   . Hx of iron deficiency anemia    reports [place] trait; reports on iron her whole life  . Low back pain    chronic, reports hx DDD treated by chiropractor  . No blood products    Jehovah's Witness  . OAB (overactive bladder)   . Seasonal allergies   . Sickle cell trait ([place])   . Uterine fibroid     Past Surgical History:  Procedure Laterality Date  . BUNIONECTOMY Bilateral 1987  . CESAREAN SECTION    . Teeth extracted   01/14/2017  . TUBAL LIGATION    . TUMOR REMOVAL     behind left eye    There were no vitals filed for this visit.      Subjective Assessment - 01/25/17 1211    Subjective I am doing well.  My back has mild pain.  I started antibiotics and my mouth pain has decreased.  I had a small infection.  I like the stretches best   Currently in Pain? Yes   Pain Score --  mild no number given   Pain Location Hip   Pain Orientation Right   Pain Descriptors / Indicators Aching   Pain Radiating Towards Not today   Pain Frequency Intermittent   Aggravating Factors  standing longer   Pain Relieving Factors Hip- stretches, heat  antibiotics helped mouth pain,     Effect of Pain on Daily Activities  limits longer standing                         [facility] Adult PT Treatment/Exercise - 01/25/17 0001      Lumbar Exercises: Stretches   Prone Mid Back Stretch Limitations QL stretch over pillow with arm overhead     Lumbar Exercises: Supine   Other Supine Lumbar Exercises pilates core 2 red and 1 blue for leg work and arms 5-10 X eack  challanging,pain flare 5/10 with these, several ex tolerated     Moist Heat Therapy   Number Minutes Moist Heat 15 Minutes   Moist Heat Location Lumbar Spine  sidelying     Ultrasound   Ultrasound Location Left flank/low back while stretching   Ultrasound Parameters 100%, 1.5 watts/cm2   Ultrasound Goals Pain                  PT Short Term Goals - 01/25/17 1219      PT SHORT TERM GOAL #1   Title Pt will be I with initial HEP   Baseline ndependent with her stretches   Time 3   Period Weeks  Status On-going     PT SHORT TERM GOAL #2   Title Pt will report 25% reduced R LE radicular sxs to ankle   Baseline no leg pain.  ? lasting?   Time 3   Period Weeks   Status Partially Met     PT SHORT TERM GOAL #3   Title pt will report being able to sit x 15 minutes to eat dinner without increased symptoms   Baseline No assessed however paitent did say mostly what increases her pain is longer standing   Time 3   Period Weeks   Status Unable to assess     PT SHORT TERM GOAL #4   Title Pt will not be woken at night by painful throbbing of foot   Baseline not woken by foot   Time 3   Period Weeks   Status Achieved           PT Long Term Goals - 01/11/17 1143      PT LONG TERM GOAL #1   Title Pt will be I with advanced HEP   Baseline unmet   Time 6   Period Weeks   Status New     PT LONG TERM GOAL #2   Title Pt will report overall LBP </= 3/10 with all functional activities   Baseline 7/10   Time 6   Period Weeks   Status New     PT LONG TERM GOAL #3   Title Pt will report decreased R LE radicular sxs x  75% with all functional activities   Baseline unmet   Time 6   Period Weeks   Status New     PT LONG TERM GOAL #4   Title Pt will be able to sit x 1 hour with 3/10 LBP to watch a movie   Baseline unmet   Time 6   Period Weeks   Status New               Plan - 01/25/17 1216    Clinical Impression Statement Patient had intermitant mild pain today which increased to 5/10 while working core on [employer].  Pain eased with [place] and heat at end of session. Leg pain has resolved. Patient is considering pilates for back pain class.   PT Next Visit Plan Issue HEP; [place] to R glute, lumbar flexibility exercises, begin McKenzie exercises, traction?  Consider Mulitfitus ex for HEP.   PT Home Exercise Plan pt instruction issued; see pt instruction section   Consulted and Agree with Plan of Care Patient      Patient will benefit from skilled therapeutic intervention in order to improve the following deficits and impairments:  Decreased mobility, Decreased strength, Hypomobility, Difficulty walking, Increased muscle spasms, Impaired flexibility, Improper body mechanics  Visit Diagnosis: Acute right-sided low back pain with right-sided sciatica  Abnormal posture  Pain in right foot  Difficulty in walking, not elsewhere classified     Problem List Patient Active Problem List   Diagnosis Date Noted  . Seasonal allergies 01/21/2015  . OAB (overactive bladder) 01/21/2015  . Dyslipidemia 02/14/2014  . Obesity 12/19/2009  . LOW BACK PAIN 03/18/2008  . DEPRESSION/ANXIETY 06/15/2007  . HYPERTENSION, BENIGN 06/15/2007    Ashby Moskal PTA 01/25/2017, 12:22 PM  [facility] [address] [place], [place], [address] Phone: [phone]   Fax:  [phone]  Name: Rachel Fields MRN: [id number] Date of Birth: Mar 05, 1956

## 2017-01-27 ENCOUNTER — Encounter: Payer: Self-pay | Admitting: Physical Therapy

## 2017-01-27 ENCOUNTER — Ambulatory Visit: Payer: 59 | Admitting: Physical Therapy

## 2017-01-27 DIAGNOSIS — R262 Difficulty in walking, not elsewhere classified: Secondary | ICD-10-CM

## 2017-01-27 DIAGNOSIS — M5441 Lumbago with sciatica, right side: Secondary | ICD-10-CM | POA: Diagnosis not present

## 2017-01-27 DIAGNOSIS — R293 Abnormal posture: Secondary | ICD-10-CM

## 2017-01-27 DIAGNOSIS — M79671 Pain in right foot: Secondary | ICD-10-CM | POA: Diagnosis not present

## 2017-01-27 NOTE — Therapy (Signed)
[place], [place], [address] Phone: [phone]   Fax:  [phone]  Physical Therapy Treatment  Patient Details  Name: Rachel Fields MRN: [id number] Date of Birth: 1956-08-18 Referring Provider: Dr. Layne Benton  Encounter Date: 01/27/2017      PT End of Session - 01/27/17 1818    Visit Number --  short session patient wanted [place] and Moist heat only.      Past Medical History:  Diagnosis Date  . Allergy   . Anxiety   . Anxiety and depression   . Arthritis   . Depression   . HTN (hypertension)   . Hx of iron deficiency anemia    reports [place] trait; reports on iron her whole life  . Low back pain    chronic, reports hx DDD treated by chiropractor  . No blood products    Jehovah's Witness  . OAB (overactive bladder)   . Seasonal allergies   . Sickle cell trait ([place])   . Uterine fibroid     Past Surgical History:  Procedure Laterality Date  . BUNIONECTOMY Bilateral 1987  . CESAREAN SECTION    . Teeth extracted   01/14/2017  . TUBAL LIGATION    . TUMOR REMOVAL     behind left eye    There were no vitals filed for this visit.      Subjective Assessment - 01/27/17 1022    Subjective Right low back. Mild.  I did not take anything for pain yet.  I have a hard time getting comfortable at night.  I have 3 appointments today.  PT,  my Lawyer and the dentist.    Currently in Pain? Yes   Pain Location Back   Pain Orientation Right   Pain Descriptors / Indicators Tender;Tightness;Squeezing  knot    Pain Radiating Towards no   Pain Frequency Intermittent   Aggravating Factors  trying to get comfortable in bed   Pain Relieving Factors stretches,  heat pillows   Effect of Pain on Daily Activities limits longer sleeping with waking every 2-3 hours.                         [facility] Adult PT Treatment/Exercise - 01/27/17 0001      Moist Heat Therapy   Number Minutes Moist Heat 15 Minutes    Moist Heat Location Lumbar Spine     Ultrasound   Ultrasound Location right low back   Ultrasound Parameters 100%,  1.5 watts/cm 2   Ultrasound Goals Pain                  PT Short Term Goals - 01/25/17 1219      PT SHORT TERM GOAL #1   Title Pt will be I with initial HEP   Baseline ndependent with her stretches   Time 3   Period Weeks   Status On-going     PT SHORT TERM GOAL #2   Title Pt will report 25% reduced R LE radicular sxs to ankle   Baseline no leg pain.  ? lasting?   Time 3   Period Weeks   Status Partially Met     PT SHORT TERM GOAL #3   Title pt will report being able to sit x 15 minutes to eat dinner without increased symptoms   Baseline No assessed however paitent did say mostly what increases her pain is longer standing   Time 3   Period  Weeks   Status Unable to assess     PT SHORT TERM GOAL #4   Title Pt will not be woken at night by painful throbbing of foot   Baseline not woken by foot   Time 3   Period Weeks   Status Achieved           PT Long Term Goals - 01/11/17 1143      PT LONG TERM GOAL #1   Title Pt will be I with advanced HEP   Baseline unmet   Time 6   Period Weeks   Status New     PT LONG TERM GOAL #2   Title Pt will report overall LBP </= 3/10 with all functional activities   Baseline 7/10   Time 6   Period Weeks   Status New     PT LONG TERM GOAL #3   Title Pt will report decreased R LE radicular sxs x 75% with all functional activities   Baseline unmet   Time 6   Period Weeks   Status New     PT LONG TERM GOAL #4   Title Pt will be able to sit x 1 hour with 3/10 LBP to watch a movie   Baseline unmet   Time 6   Period Weeks   Status New               Plan - 01/27/17 1057    Clinical Impression Statement Patient with" mild pain "today with pain now centralizing into right low back.  [place] Heat at patient's request.  She declined exercises saying she forgot her pain meds and did not want to  exercise..  It was then reported her pain was 7/10 . [place] did not make her pain worse. It was the same pain she came in   PT Next Visit Plan HEP and exercise,  wean from modalities.   Consulted and Agree with Plan of Care Patient      Patient will benefit from skilled therapeutic intervention in order to improve the following deficits and impairments:     Visit Diagnosis: Acute right-sided low back pain with right-sided sciatica  Abnormal posture  Pain in right foot  Difficulty in walking, not elsewhere classified     Problem List Patient Active Problem List   Diagnosis Date Noted  . Seasonal allergies 01/21/2015  . OAB (overactive bladder) 01/21/2015  . Dyslipidemia 02/14/2014  . Obesity 12/19/2009  . LOW BACK PAIN 03/18/2008  . DEPRESSION/ANXIETY 06/15/2007  . HYPERTENSION, BENIGN 06/15/2007    Sanav Remer PTA 01/27/2017, 6:21 PM  [facility] [address] [place], [place], [address] Phone: [phone]   Fax:  [phone]  Name: Rachel Fields MRN: [id number] Date of Birth: Jul 03, 1956

## 2017-01-31 ENCOUNTER — Ambulatory Visit: Payer: 59 | Admitting: Family Medicine

## 2017-01-31 NOTE — Progress Notes (Deleted)
HPI:  Rachel Fields is a pleasant 61 y.o. here for follow up. Chronic medical problems summarized below were reviewed for changes and stability and were updated as needed below. These issues and their treatment remain stable for the most part. Was about to see specialist for her back pain last visit. Did labs. Denies CP, SOB, DOE, treatment intolerance or new symptoms.   Hypertension: -meds:lisinopril 10  OAB (overactive bladder) -meds: detrol la  Dyslipidemia/Obesity/Prediabetes: -BMI 31.0-31.9,adult -meds: none  Depression: -meds: effexor  R low back pain: -chronic, intermittent -seen by Dr. Cyd Silence and [facility] in the past, seeing specialist currently -PT helps   ROS: See pertinent positives and negatives per HPI.  Past Medical History:  Diagnosis Date  . Allergy   . Anxiety   . Anxiety and depression   . Arthritis   . Depression   . HTN (hypertension)   . Hx of iron deficiency anemia    reports [place] trait; reports on iron her whole life  . Low back pain    chronic, reports hx DDD treated by chiropractor  . No blood products    Jehovah's Witness  . OAB (overactive bladder)   . Seasonal allergies   . Sickle cell trait ([place])   . Uterine fibroid     Past Surgical History:  Procedure Laterality Date  . BUNIONECTOMY Bilateral 1987  . CESAREAN SECTION    . Teeth extracted   01/14/2017  . TUBAL LIGATION    . TUMOR REMOVAL     behind left eye    Family History  Problem Relation Age of Onset  . Diabetes Father   . Hypertension Father   . Dementia Father     senile  . Other Mother     smoker  . Lung cancer Mother     2010  . Bipolar disorder Sister   . Schizophrenia Sister   . Schizophrenia Sister   . Bipolar disorder Sister   . Schizophrenia Sister   . Bipolar disorder Sister   . Bipolar disorder Daughter   . Colon cancer Neg Hx     Social History   Social History  . Marital status: Single    Spouse name: N/A  . Number of children: N/A   . Years of education: N/A   Social History Main Topics  . Smoking status: Never Smoker  . Smokeless tobacco: Never Used  . Alcohol use Yes     Comment: socially - rare; 2 drinks a few times per month  . Drug use: No  . Sexual activity: Not on file   Other Topics Concern  . Not on file   Social History Narrative   Work or School: CNA, working on P[employer] consultant      Home Situation: Lives with husband - married in 2015; hx of homelessness in 2015      Spiritual Beliefs: Jehovah's Witness      Lifestyle: no regular CV exercise; diet improving - reports she has been able to loose weight                       Current Outpatient Prescriptions:  .  calcium-vitamin D (CALCIUM + D) 250-125 MG-UNIT per tablet, Take 1 tablet by mouth daily., Disp: 30 tablet, Rfl: 3 .  cyclobenzaprine (FLEXERIL) 5 MG tablet, Take 1 tablet (5 mg total) by mouth 2 (two) times daily as needed for muscle spasms., Disp: 20 tablet, Rfl: 0 .  Fe Fum-FA-B Cmp-C-Zn-Mg-Mn-Cu (HEMOCYTE PLUS) 106-1  MG CAPS, Take 1 capsule by mouth daily., Disp: 30 each, Rfl: 3 .  fluticasone (FLONASE) 50 MCG/ACT nasal spray, Place 1 spray into both nostrils at bedtime. (Patient taking differently: Place 1 spray into both nostrils at bedtime as needed for allergies. ), Disp: 16 g, Rfl: 3 .  fluticasone (FLONASE) 50 MCG/ACT nasal spray, Place 2 sprays into both nostrils daily. For 1 month, then 1 spray thereafter, Disp: 16 g, Rfl: 6 .  lisinopril (PRINIVIL,ZESTRIL) 10 MG tablet, Take 1 tablet (10 mg total) by mouth daily., Disp: 90 tablet, Rfl: 3 .  naproxen (NAPROSYN) 500 MG tablet, 1 tablet 1-2 times daily as needed for pain, Disp: 14 tablet, Rfl: 0 .  OVER THE COUNTER MEDICATION, Take 2 tablets by mouth daily. Biotin 505mcg, Disp: , Rfl:  .  tolterodine (DETROL LA) 4 MG 24 hr capsule, Take 1 capsule (4 mg total) by mouth daily., Disp: 90 capsule, Rfl: 3 .  venlafaxine XR (EFFEXOR-XR) 75 MG 24 hr capsule, Take 3 capsules by mouth daily  with breakfast, Disp: 270 capsule, Rfl: 3  EXAM:  There were no vitals filed for this visit.  There is no height or weight on file to calculate BMI.  GENERAL: vitals reviewed and listed above, alert, oriented, appears well hydrated and in no acute distress  HEENT: atraumatic, conjunttiva clear, no obvious abnormalities on inspection of external nose and ears  NECK: no obvious masses on inspection  LUNGS: clear to auscultation bilaterally, no wheezes, rales or rhonchi, good air movement  CV: HRRR, no peripheral edema  MS: moves all extremities without noticeable abnormality  PSYCH: pleasant and cooperative, no obvious depression or anxiety  ASSESSMENT AND PLAN:  Discussed the following assessment and plan:  No diagnosis found.  -Patient advised to return or notify a doctor immediately if symptoms worsen or persist or new concerns arise.  There are no Patient Instructions on file for this visit.  Colin Benton R., DO

## 2017-02-01 ENCOUNTER — Encounter: Payer: Self-pay | Admitting: Family Medicine

## 2017-02-01 ENCOUNTER — Ambulatory Visit: Payer: 59 | Admitting: Family Medicine

## 2017-02-01 ENCOUNTER — Encounter: Payer: Self-pay | Admitting: Physical Therapy

## 2017-02-01 ENCOUNTER — Ambulatory Visit: Payer: 59 | Admitting: Physical Therapy

## 2017-02-01 DIAGNOSIS — M79671 Pain in right foot: Secondary | ICD-10-CM | POA: Diagnosis not present

## 2017-02-01 DIAGNOSIS — R293 Abnormal posture: Secondary | ICD-10-CM | POA: Diagnosis not present

## 2017-02-01 DIAGNOSIS — Z91199 Patient's noncompliance with other medical treatment and regimen due to unspecified reason: Secondary | ICD-10-CM

## 2017-02-01 DIAGNOSIS — R262 Difficulty in walking, not elsewhere classified: Secondary | ICD-10-CM

## 2017-02-01 DIAGNOSIS — Z5329 Procedure and treatment not carried out because of patient's decision for other reasons: Secondary | ICD-10-CM

## 2017-02-01 DIAGNOSIS — M5441 Lumbago with sciatica, right side: Secondary | ICD-10-CM | POA: Diagnosis not present

## 2017-02-01 MED FILL — FLUTICASONE PROP 50 MCG SPR: 50 | 60 days supply | Qty: 16 | Fill #2

## 2017-02-01 MED FILL — NAPROXEN SODIUM 550 MG TAB: 550 | 6 days supply | Qty: 12 | Fill #0

## 2017-02-01 MED FILL — VENLAFAXINE HCL ER 75 MG CA: 75 | 30 days supply | Qty: 90 | Fill #6

## 2017-02-01 MED FILL — CHLORHEXIDINE 0.12% RINSE: 0.12 | 17 days supply | Qty: 473 | Fill #0

## 2017-02-01 MED FILL — TOLTERODINE TART ER 4 MG CA: 4 | 30 days supply | Qty: 30 | Fill #5

## 2017-02-01 NOTE — Progress Notes (Signed)
NO SHOW.   On  ROC prior to appointment. 1 month follow up for elevated BP: -hx htn, elevated BP last visit when in pain with back issues -meds:lisinopril 10 -PMH of obesity, hld and hyperglycemia, depression -just had labs, due for CPE in May

## 2017-02-01 NOTE — Therapy (Signed)
[place], [place], [address] Phone: [phone]   Fax:  [phone]  Physical Therapy Treatment  Patient Details  Name: Rachel Fields MRN: [id number] Date of Birth: 05/21/1956 Referring Provider: Dr. Layne Benton  Encounter Date: 02/01/2017      PT End of Session - 02/01/17 1756    Visit Number 6   Number of Visits 18   Date for PT Re-Evaluation 02/22/17   PT Start Time 1635   PT Stop Time 1730   PT Time Calculation (min) 55 min   Activity Tolerance Patient tolerated treatment well   Behavior During Therapy [facility] for tasks assessed/performed      Past Medical History:  Diagnosis Date  . Allergy   . Anxiety   . Anxiety and depression   . Arthritis   . Depression   . HTN (hypertension)   . Hx of iron deficiency anemia    reports [place] trait; reports on iron her whole life  . Low back pain    chronic, reports hx DDD treated by chiropractor  . No blood products    Jehovah's Witness  . OAB (overactive bladder)   . Seasonal allergies   . Sickle cell trait ([place])   . Uterine fibroid     Past Surgical History:  Procedure Laterality Date  . BUNIONECTOMY Bilateral 1987  . CESAREAN SECTION    . Teeth extracted   01/14/2017  . TUBAL LIGATION    . TUMOR REMOVAL     behind left eye    There were no vitals filed for this visit.      Subjective Assessment - 02/01/17 1730    Subjective Pain in low back improved at least 50 %.  She has not had any since before last visit.  She is doing her HEP without problems.  I can sit 30-45 minutes prior to pain.  I am not yet back to work.    Currently in Pain? Yes   Pain Score 7    Pain Location Back   Pain Orientation Right   Pain Descriptors / Indicators Tightness;Tender   Pain Type Acute pain   Pain Radiating Towards No   Pain Frequency Intermittent   Aggravating Factors  trying to get comfortable at night.   Pain Relieving Factors stretches   Effect of Pain  on Daily Activities Limits Sleeping,  not back to work                         [facility] Adult PT Treatment/Exercise - 02/01/17 0001      Lumbar Exercises: Stretches   Quad Stretch 3 reps;20 seconds  right     Lumbar Exercises: Supine   Other Supine Lumbar Exercises pilates for core sindle and doulle legs narrow and wider, arms .  monitored for control fatigued.     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  Tissue softened and decreased in sensitivity.,  less pain                  PT Short Term Goals - 01/25/17 1219      PT SHORT TERM GOAL #1   Title Pt will be I with initial HEP   Baseline ndependent with her stretches   Time 3   Period Weeks   Status On-going     PT SHORT TERM GOAL #2   Title Pt will report 25% reduced R LE radicular sxs to ankle   Baseline no leg pain.  ? lasting?   Time 3   Period Weeks   Status Partially Met     PT SHORT TERM GOAL #3   Title pt will report being able to sit x 15 minutes to eat dinner without increased symptoms   Baseline No assessed however paitent did say mostly what increases her pain is longer standing   Time 3   Period Weeks   Status Unable to assess     PT SHORT TERM GOAL #4   Title Pt will not be woken at night by painful throbbing of foot   Baseline not woken by foot   Time 3   Period Weeks   Status Achieved           PT Long Term Goals - 01/11/17 1143      PT  LONG TERM GOAL #1   Title Pt will be I with advanced HEP   Baseline unmet   Time 6   Period Weeks   Status New     PT LONG TERM GOAL #2   Title Pt will report overall LBP </= 3/10 with all functional activities   Baseline 7/10   Time 6   Period Weeks   Status New     PT LONG TERM GOAL #3   Title Pt will report decreased R LE radicular sxs x 75% with all functional activities   Baseline unmet   Time 6   Period Weeks   Status New     PT LONG TERM GOAL #4   Title Pt will be able to sit x 1 hour with 3/10 LBP to watch a movie   Baseline unmet   Time 6   Period Weeks   Status New               Plan - 02/01/17 1757    Clinical Impression Statement pain 5/10 at end of session she is tolerating core exercises and should be ready to to progress her HEP next visit.  STG#2, #3 met.    PT Next Visit Plan HEP and exercise,  add multifitus or consider quadriped extension exercises for home.  Patrient would like to read the Sex and back pain book.   PT Home Exercise Plan pt instruction issued; see pt instruction section   Consulted and Agree with Plan of Care Patient      Patient will benefit from skilled therapeutic intervention in order to improve the following deficits and impairments:  Decreased mobility, Decreased strength, Hypomobility, Difficulty walking, Increased muscle spasms, Impaired flexibility, Improper body mechanics  Visit Diagnosis: Acute right-sided low back pain with right-sided sciatica  Abnormal posture  Pain in right foot  Difficulty in walking, not elsewhere classified     Problem List Patient Active Problem List   Diagnosis Date Noted  . Seasonal allergies 01/21/2015  . OAB (overactive bladder) 01/21/2015  .  Dyslipidemia 02/14/2014  . Obesity 12/19/2009  . LOW BACK PAIN 03/18/2008  . DEPRESSION/ANXIETY 06/15/2007  . HYPERTENSION, BENIGN 06/15/2007    Nyala Kirchner PTA 02/01/2017, 6:05 PM  [facility] [address] [place], [place], [address] Phone: [phone]   Fax:  [phone]  Name: Rachel Fields MRN: [id number] Date of Birth: [birth year]

## 2017-02-08 ENCOUNTER — Ambulatory Visit: Payer: 59 | Admitting: Physical Therapy

## 2017-02-09 ENCOUNTER — Ambulatory Visit: Payer: 59 | Admitting: Physical Therapy

## 2017-02-10 ENCOUNTER — Encounter: Payer: Self-pay | Admitting: Physical Therapy

## 2017-02-10 ENCOUNTER — Ambulatory Visit: Payer: 59 | Attending: Orthopedic Surgery | Admitting: Physical Therapy

## 2017-02-10 ENCOUNTER — Other Ambulatory Visit: Payer: Self-pay | Admitting: Sports Medicine

## 2017-02-10 DIAGNOSIS — M79671 Pain in right foot: Secondary | ICD-10-CM | POA: Insufficient documentation

## 2017-02-10 DIAGNOSIS — M5441 Lumbago with sciatica, right side: Secondary | ICD-10-CM | POA: Insufficient documentation

## 2017-02-10 DIAGNOSIS — R262 Difficulty in walking, not elsewhere classified: Secondary | ICD-10-CM | POA: Diagnosis not present

## 2017-02-10 DIAGNOSIS — M545 Low back pain, unspecified: Secondary | ICD-10-CM

## 2017-02-10 DIAGNOSIS — R293 Abnormal posture: Secondary | ICD-10-CM | POA: Insufficient documentation

## 2017-02-10 DIAGNOSIS — M47819 Spondylosis without myelopathy or radiculopathy, site unspecified: Secondary | ICD-10-CM

## 2017-02-10 NOTE — Therapy (Signed)
[place], [place], [address] Phone: [phone]   Fax:  [phone]  Physical Therapy Treatment  Patient Details  Name: Rachel Fields MRN: [id number] Date of Birth: [birth year] Referring Provider: Dr. Layne Benton  Encounter Date: 02/10/2017      PT End of Session - 02/10/17 1354    Visit Number 7   Number of Visits 18   Date for PT Re-Evaluation 02/22/17   PT Start Time 1017   PT Stop Time 1115   PT Time Calculation (min) 58 min   Activity Tolerance Patient tolerated treatment well   Behavior During Therapy [facility] for tasks assessed/performed      Past Medical History:  Diagnosis Date  . Allergy   . Anxiety   . Anxiety and depression   . Arthritis   . Depression   . HTN (hypertension)   . Hx of iron deficiency anemia    reports [place] trait; reports on iron her whole life  . Low back pain    chronic, reports hx DDD treated by chiropractor  . No blood products    Jehovah's Witness  . OAB (overactive bladder)   . Seasonal allergies   . Sickle cell trait ([place])   . Uterine fibroid     Past Surgical History:  Procedure Laterality Date  . BUNIONECTOMY Bilateral 1987  . CESAREAN SECTION    . Teeth extracted   01/14/2017  . TUBAL LIGATION    . TUMOR REMOVAL     behind left eye    There were no vitals filed for this visit.      Subjective Assessment - 02/10/17 1022    Subjective Pain 8/10.  I am scheduled to return to full duty Next Wednesday.  PT helps temporarily. Sat had shooting pain right side 1 little pain , I got off my feet and it went away.  I have pain with pressure on right leg.   Pertinent History Pt reports a fall 12/22/16 at work where she fell. XRay of her R hip is -. She reports an lower lumbar spine disc slippage. Meds were issued which are helping but limiting function. Pt states she cannot put pressure on her R leg with transfers until she is all the way standing. 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 Yes   Pain Score 8    Pain Location Back   Pain Orientation Right   Pain Descriptors / Indicators Tightness;Tender;Shooting   Pain Type Acute pain   Pain Radiating Towards right leg,  intermitant   Pain Frequency Intermittent   Aggravating Factors  activity,  sex   Pain Relieving Factors laying down   Effect of Pain on Daily Activities Not back to work, Pain still wakes her up at night,  Activities limited,                           [facility] Adult PT Treatment/Exercise - 02/10/17 0001      Lumbar Exercises: Stretches   Pelvic Tilt 5 reps   Pelvic Tilt Limitations cues breathing   Quad Stretch 3 reps;20 seconds     Lumbar Exercises: Supine   Clam Limitations Ball squeeze 10 X  Also stabilization. both and double   X each   Bridge 10 reps   Straight Leg Raise 10 reps  right     Moist Heat Therapy   Number Minutes Moist Heat 15 Minutes  Moist Heat Location Lumbar Spine     Manual Therapy   Manual therapy comments soft tissue work, instrument assist / and by hand.  Tightness eased low back right gluteal.  Multiple tender sore areas.   passive stretch to right Quadratus lumborum with strumming                PT Education - 02/10/17 1354    Education provided Yes   Education Details Sex and Back pain   Person(s) Educated Patient   Methods Handout;Explanation   Comprehension Verbalized understanding          PT Short Term Goals - 02/10/17 1029      PT SHORT TERM GOAL #1   Title Pt will be I with initial HEP   Baseline independent   Time 3   Period Weeks   Status Achieved     PT SHORT TERM GOAL #2   Title Pt will report 25% reduced R LE radicular sxs to ankle   Baseline 30-40%   Time 3   Period Weeks   Status Achieved     PT SHORT TERM GOAL #3   Baseline 1.5 hours   Time 3   Period Weeks   Status Achieved     PT SHORT TERM GOAL #4   Title Pt will not be woken at night by painful throbbing of foot   Baseline  not woken by foot   Time 3   Period Weeks   Status Achieved           PT Long Term Goals - 02/10/17 1032      PT LONG TERM GOAL #1   Title Pt will be I with advanced HEP   Time 6   Period Weeks   Status Unable to assess     PT LONG TERM GOAL #2   Title Pt will report overall LBP </= 3/10 with all functional activities   Baseline 8/10   Time 6   Period Weeks   Status On-going     PT LONG TERM GOAL #3   Title Pt will report decreased R LE radicular sxs x 75% with all functional activities   Baseline 30-40%   Time 6   Period Weeks     PT LONG TERM GOAL #4   Title Pt will be able to sit x 1 hour with 3/10 LBP to watch a movie   Baseline 4-5/10   Time 6   Period Weeks   Status On-going               Plan - 02/10/17 1350    Clinical Impression Statement All STG's met.  Patient was able to level pelvis with exercise todsay.  Core stabilization continue.  Less pain at end of session,  nonumber given.   PT Next Visit Plan HEP and exercise,  consider quadriped  or multifitus for HEP.    Lumbar stab I   Consulted and Agree with Plan of Care Patient      Patient will benefit from skilled therapeutic intervention in order to improve the following deficits and impairments:  Decreased mobility, Decreased strength, Hypomobility, Difficulty walking, Increased muscle spasms, Impaired flexibility, Improper body mechanics  Visit Diagnosis: Acute right-sided low back pain with right-sided sciatica  Abnormal posture  Pain in right foot  Difficulty in walking, not elsewhere classified     Problem List Patient Active Problem List   Diagnosis Date Noted  . Seasonal allergies 01/21/2015  . OAB (overactive bladder)  01/21/2015  . Dyslipidemia 02/14/2014  . Obesity 12/19/2009  . LOW BACK PAIN 03/18/2008  . DEPRESSION/ANXIETY 06/15/2007  . HYPERTENSION, BENIGN 06/15/2007    HARRIS,KAREN PTA 02/10/2017, 1:55 PM  [facility] [address] [place], [place], [address] Phone: [phone]   Fax:  [phone]  Name: Rachel Fields MRN: [id number] Date of Birth: 07-26-1956

## 2017-02-12 ENCOUNTER — Ambulatory Visit
Admission: RE | Admit: 2017-02-12 | Discharge: 2017-02-12 | Disposition: A | Discharge status: Home / Self Care | Payer: 59 | Source: Ambulatory Visit | Attending: Sports Medicine | Admitting: Sports Medicine

## 2017-02-12 DIAGNOSIS — M545 Low back pain, unspecified: Secondary | ICD-10-CM

## 2017-02-12 DIAGNOSIS — M47816 Spondylosis without myelopathy or radiculopathy, lumbar region: Secondary | ICD-10-CM | POA: Diagnosis not present

## 2017-02-12 DIAGNOSIS — M47819 Spondylosis without myelopathy or radiculopathy, site unspecified: Secondary | ICD-10-CM

## 2017-02-15 ENCOUNTER — Ambulatory Visit: Payer: 59 | Admitting: Physical Therapy

## 2017-02-16 DIAGNOSIS — M5136 Other intervertebral disc degeneration, lumbar region: Secondary | ICD-10-CM | POA: Diagnosis not present

## 2017-02-16 MED FILL — tiZANidine HCL 4 MG TABS: 4 | 20 days supply | Qty: 40 | Fill #0

## 2017-02-17 ENCOUNTER — Ambulatory Visit: Payer: 59 | Admitting: Physical Therapy

## 2017-02-21 DIAGNOSIS — M47817 Spondylosis without myelopathy or radiculopathy, lumbosacral region: Secondary | ICD-10-CM | POA: Diagnosis not present

## 2017-02-21 DIAGNOSIS — M5416 Radiculopathy, lumbar region: Secondary | ICD-10-CM | POA: Diagnosis not present

## 2017-02-21 DIAGNOSIS — M5136 Other intervertebral disc degeneration, lumbar region: Secondary | ICD-10-CM | POA: Diagnosis not present

## 2017-02-21 DIAGNOSIS — M545 Low back pain: Secondary | ICD-10-CM | POA: Diagnosis not present

## 2017-02-22 ENCOUNTER — Telehealth: Payer: Self-pay | Admitting: Physical Therapy

## 2017-02-22 ENCOUNTER — Ambulatory Visit: Payer: 59 | Admitting: Physical Therapy

## 2017-02-22 NOTE — Telephone Encounter (Addendum)
Attempted to contact pt regarding multiple no show and cancellations. Phone number reaches a recording stating this number is not in service. Spoke with emergency contact who provided me with another phone number that did not appear to be accurate.  Ervin Rothbauer C. Onur Mori PT, DPT 02/22/17 3:57 PM

## 2017-02-22 NOTE — Telephone Encounter (Addendum)
ERROR - DISREGARD.    Rachel Fields C. Rachel Fields PT, DPT 02/22/17 4:05 PM

## 2017-02-22 NOTE — Addendum Note (Signed)
Addended by: Selinda Eon C on: 02/22/2017 04:10 PM   Modules accepted: Miquel Dunn

## 2017-02-23 ENCOUNTER — Other Ambulatory Visit: Payer: Self-pay

## 2017-02-28 ENCOUNTER — Other Ambulatory Visit: Payer: Self-pay | Admitting: Family Medicine

## 2017-02-28 NOTE — Telephone Encounter (Signed)
Second No Show/Cx same day this year.

## 2017-03-01 MED FILL — VENLAFAXINE HCL ER 75 MG CA: 75 | 30 days supply | Qty: 90 | Fill #0

## 2017-03-01 MED FILL — LISINOPRIL 5 MG TABLET: 5 | 30 days supply | Qty: 30 | Fill #0

## 2017-03-03 DIAGNOSIS — M47817 Spondylosis without myelopathy or radiculopathy, lumbosacral region: Secondary | ICD-10-CM | POA: Diagnosis not present

## 2017-03-03 DIAGNOSIS — M5136 Other intervertebral disc degeneration, lumbar region: Secondary | ICD-10-CM | POA: Diagnosis not present

## 2017-03-03 DIAGNOSIS — M5416 Radiculopathy, lumbar region: Secondary | ICD-10-CM | POA: Diagnosis not present

## 2017-03-14 ENCOUNTER — Ambulatory Visit: Payer: 59 | Admitting: Physical Therapy

## 2017-03-23 ENCOUNTER — Encounter: Payer: Self-pay | Admitting: Physical Therapy

## 2017-03-23 ENCOUNTER — Ambulatory Visit: Payer: 59 | Attending: Orthopedic Surgery | Admitting: Physical Therapy

## 2017-03-23 DIAGNOSIS — R262 Difficulty in walking, not elsewhere classified: Secondary | ICD-10-CM | POA: Diagnosis not present

## 2017-03-23 DIAGNOSIS — M5441 Lumbago with sciatica, right side: Secondary | ICD-10-CM | POA: Insufficient documentation

## 2017-03-23 DIAGNOSIS — R293 Abnormal posture: Secondary | ICD-10-CM | POA: Insufficient documentation

## 2017-03-23 DIAGNOSIS — M79671 Pain in right foot: Secondary | ICD-10-CM | POA: Diagnosis not present

## 2017-03-23 DIAGNOSIS — M25551 Pain in right hip: Secondary | ICD-10-CM | POA: Diagnosis not present

## 2017-03-23 NOTE — Therapy (Signed)
[place], [place], [address] Phone: [phone]   Fax:  [phone]  Physical Therapy Treatment/Re-evaluation  Patient Details  Name: Rachel Fields MRN: [id number] Date of Birth: Dec 06, 1955 Referring Provider: Verner Chol, MD  Encounter Date: 03/23/2017      PT End of Session - 03/23/17 1101    Visit Number 8   Number of Visits 15   Date for PT Re-Evaluation 04/22/17   Authorization Type [facility]   PT Start Time 1102   PT Stop Time 1200   PT Time Calculation (min) 58 min   Activity Tolerance Patient tolerated treatment well   Behavior During Therapy [facility] for tasks assessed/performed      Past Medical History:  Diagnosis Date  . Allergy   . Anxiety   . Anxiety and depression   . Arthritis   . Depression   . HTN (hypertension)   . Hx of iron deficiency anemia    reports [place] trait; reports on iron her whole life  . Low back pain    chronic, reports hx DDD treated by chiropractor  . No blood products    Jehovah's Witness  . OAB (overactive bladder)   . Seasonal allergies   . Sickle cell trait ([place])   . Uterine fibroid     Past Surgical History:  Procedure Laterality Date  . BUNIONECTOMY Bilateral 1987  . CESAREAN SECTION    . Teeth extracted   01/14/2017  . TUBAL LIGATION    . TUMOR REMOVAL     behind left eye    There were no vitals filed for this visit.      Subjective Assessment - 03/23/17 1102    Subjective Reports back is about the same, has been doing her exercises. Still feels pain on the right side of her low back with less pain down her leg since injection and is getting another injection. Unable to get out of a chair/out of car putting weight into R leg. Is back to work but is just sitting.    Pertinent History Pt reports a fall 12/22/16 at work where she fell. XRay of her R hip is -. She reports an lower lumbar spine disc slippage. Meds were issued which are helping but  limiting function. Pt states she cannot put pressure on her R leg with transfers until she is all the way standing. 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 Yes   Pain Score 7    Pain Location Back   Pain Orientation Right;Lower   Pain Descriptors / Indicators Throbbing   Aggravating Factors  standing from chair   Pain Relieving Factors rest/lay down            [facility] PT Assessment - 03/23/17 0001      Assessment   Medical Diagnosis R LBP, R sciatica, R gluteal pain   Referring Provider Verner Chol, MD   Onset Date/Surgical Date 12/22/16     Balance Screen   Has the patient fallen in the past 6 months Yes   How many times? 1   Has the patient had a decrease in activity level because of a fear of falling?  No   Is the patient reluctant to leave their home because of a fear of falling?  No  fearful of pain     Home Environment  Living Environment Private residence     Prior Function   Level of Independence Independent     Cognition   Overall Cognitive Status Within Functional Limits for tasks assessed     Observation/Other Assessments   Focus on Therapeutic Outcomes (FOTO)  not in system     Sensation   Additional Comments [facility]     Coordination   Gross Motor Movements are Fluid and Coordinated Yes     Posture/Postural Control   Posture Comments stand from chair with twist to use LLE, stand with slight trunk flexion     PROM   Overall PROM Comments limited R hip ER     Strength   Strength Assessment Site Hip   Right/Left Hip Right;Left   Right Hip Flexion 5/5   Right Hip ABduction 3+/5   Left Hip Flexion 5/5   Left Hip ABduction 4/5     Palpation   Palpation comment concordant pain TFL, glut max     Transfers   Comments avoids pressure in RLE     Ambulation/Gait   Gait Comments WFL                     [facility] Adult PT Treatment/Exercise - 03/23/17  0001      Exercises   Exercises Knee/Hip     Lumbar Exercises: Stretches   Prone Mid Back Stretch Limitations seated, sidebend QL stretch     Knee/Hip Exercises: Stretches   Other Knee/Hip Stretches figure 4     Knee/Hip Exercises: Supine   Other Supine Knee/Hip Exercises hooklying pillow squeeze with pelvic tilt     Moist Heat Therapy   Number Minutes Moist Heat 15 Minutes   Moist Heat Location Hip  R     Manual Therapy   Soft tissue mobilization roller glut max, TFL, ITB          Trigger Point Dry Needling - 03/23/17 1153    Consent Given? Yes   Education Handout Provided --  verbal education   Muscles Treated Lower Body Tensor fascia lata;Gluteus maximus   Gluteus Maximus Response Twitch response elicited;Palpable increased muscle length   Tensor Fascia Lata Response Twitch response elicited;Palpable increased muscle length              PT Education - 03/23/17 1157    Education provided Yes   Education Details anatomy of condition, POC, HEP, exercise form/rationale, TPDN & expected outcomes, re-evaluation performed today, attendance   Person(s) Educated Patient   Methods Explanation;Demonstration;Tactile cues;Verbal cues   Comprehension Verbalized understanding;Returned demonstration;Verbal cues required;Tactile cues required;Need further instruction          PT Short Term Goals - 02/10/17 1029      PT SHORT TERM GOAL #1   Title Pt will be I with initial HEP   Baseline independent   Time 3   Period Weeks   Status Achieved     PT SHORT TERM GOAL #2   Title Pt will report 25% reduced R LE radicular sxs to ankle   Baseline 30-40%   Time 3   Period Weeks   Status Achieved     PT SHORT TERM GOAL #3   Baseline 1.5 hours   Time 3   Period Weeks   Status Achieved     PT SHORT TERM GOAL #4   Title Pt will not be woken at night by painful throbbing of foot   Baseline not woken by foot   Time 3   Period  Weeks   Status Achieved            PT Long Term Goals - 03/23/17 1159      PT LONG TERM GOAL #1   Title Pt will be I with advanced HEP by 7/13   Baseline poor performance due to hip pain    Time 4   Period Weeks   Status On-going     PT LONG TERM GOAL #2   Title Pt will report overall LBP </= 3/10 with all functional activities   Baseline 7/10   Time 4   Period Weeks   Status On-going     PT LONG TERM GOAL #3   Title Pt will report decreased R LE radicular sxs x 75% with all functional activities   Baseline some decrease noted   Time 4   Period Weeks   Status On-going     PT LONG TERM GOAL #4   Title Pt will be able to sit x 1 hour with 3/10 LBP to watch a movie   Baseline unable to sit 1 hr due to pain   Time 4   Period Weeks   Status On-going               Plan - 03/23/17 1206    Clinical Impression Statement Pt arrived to PT after 1 month of not being seen, attendance was discussed and pt asked to continue so a re-evaulation was performed. Pt with trigger points in TFL and glut max resulting in tightness in ITB and abnormal pull from lower back. DN performed on TFL and glut max trigger points today and pt verbalized significant improvement in test-retest of pain with sit to stand. Pt will continue to benefit from skilled PT to decrease myofasical pain to improve biomechanical chain movement.    PT Treatment/Interventions ADLs/Self Care Home Management;Cryotherapy;Electrical Stimulation;Iontophoresis 4mg /ml Dexamethasone;Moist Heat;Traction;Ultrasound;Functional mobility training;Therapeutic activities;Therapeutic exercise;Patient/family education;Passive range of motion;Manual techniques;Dry needling;Taping   PT Next Visit Plan DN effectiveness?, core engagement, hip stretching, sit<>stand, functional squat/lift   PT Home Exercise Plan figure 4 stretch, hooklying pelvic tilt with pillow squeeze;    Consulted and Agree with Plan of Care Patient      Patient will benefit from skilled therapeutic  intervention in order to improve the following deficits and impairments:  Decreased mobility, Decreased strength, Hypomobility, Difficulty walking, Increased muscle spasms, Impaired flexibility, Improper body mechanics  Visit Diagnosis: Acute right-sided low back pain with right-sided sciatica - Plan: PT plan of care cert/re-cert  Pain in right hip - Plan: PT plan of care cert/re-cert     Problem List Patient Active Problem List   Diagnosis Date Noted  . Seasonal allergies 01/21/2015  . OAB (overactive bladder) 01/21/2015  . Dyslipidemia 02/14/2014  . Obesity 12/19/2009  . LOW BACK PAIN 03/18/2008  . DEPRESSION/ANXIETY 06/15/2007  . HYPERTENSION, BENIGN 06/15/2007    Katya Rolston C. Maud Rubendall PT, DPT 03/23/17 12:17 PM   [place] [facility] [address] [place], [place], [address] Phone: [phone]   Fax:  [phone]  Name: Jacalynn Buzzell MRN: [id number] Date of Birth: 1955-12-05

## 2017-03-31 ENCOUNTER — Ambulatory Visit: Payer: 59 | Admitting: Physical Therapy

## 2017-03-31 DIAGNOSIS — R293 Abnormal posture: Secondary | ICD-10-CM

## 2017-03-31 DIAGNOSIS — M5441 Lumbago with sciatica, right side: Secondary | ICD-10-CM

## 2017-03-31 DIAGNOSIS — M25551 Pain in right hip: Secondary | ICD-10-CM

## 2017-03-31 DIAGNOSIS — R262 Difficulty in walking, not elsewhere classified: Secondary | ICD-10-CM | POA: Diagnosis not present

## 2017-03-31 DIAGNOSIS — M79671 Pain in right foot: Secondary | ICD-10-CM | POA: Diagnosis not present

## 2017-03-31 NOTE — Therapy (Signed)
[place], [place], [address] Phone: [phone]   Fax:  [phone]  Physical Therapy Treatment  Patient Details  Name: Rachel Fields MRN: [id number] Date of Birth: [birth year] Referring Provider: Verner Chol, MD  Encounter Date: 03/31/2017      PT End of Session - 03/31/17 0919    Visit Number 9   Number of Visits 15   Date for PT Re-Evaluation 04/22/17   Authorization Type [facility]   PT Start Time 0845   PT Stop Time 0938   PT Time Calculation (min) 53 min      Past Medical History:  Diagnosis Date  . Allergy   . Anxiety   . Anxiety and depression   . Arthritis   . Depression   . HTN (hypertension)   . Hx of iron deficiency anemia    reports [place] trait; reports on iron her whole life  . Low back pain    chronic, reports hx DDD treated by chiropractor  . No blood products    Jehovah's Witness  . OAB (overactive bladder)   . Seasonal allergies   . Sickle cell trait ([place])   . Uterine fibroid     Past Surgical History:  Procedure Laterality Date  . BUNIONECTOMY Bilateral 1987  . CESAREAN SECTION    . Teeth extracted   01/14/2017  . TUBAL LIGATION    . TUMOR REMOVAL     behind left eye    There were no vitals filed for this visit.      Subjective Assessment - 03/31/17 0848    Currently in Pain? Yes   Pain Score 8    Pain Location Back   Pain Orientation Right;Lower;Left   Aggravating Factors  standing from chair    Pain Relieving Factors res/lay down                          [facility] Adult PT Treatment/Exercise - 03/31/17 0001      Lumbar Exercises: Stretches   Lower Trunk Rotation 10 seconds;5 reps   Pelvic Tilt 10 seconds   Pelvic Tilt Limitations cues breathing     Lumbar Exercises: Supine   Other Supine Lumbar Exercises Table top position 10 sec x 3      Knee/Hip Exercises: Stretches   Other Knee/Hip Stretches figure 4 push and pull 2 x 30 sec each       Knee/Hip Exercises: Aerobic   Nustep L5 UE/LE x 5 minutes      Knee/Hip Exercises: Standing   Functional Squat 5 reps   Functional Squat Limitations lifting simulation      Knee/Hip Exercises: Seated   Sit to Sand 10 reps     Knee/Hip Exercises: Supine   Other Supine Knee/Hip Exercises hooklying pillow squeeze with pelvic tilt                  PT Short Term Goals - 02/10/17 1029      PT SHORT TERM GOAL #1   Title Pt will be I with initial HEP   Baseline independent   Time 3   Period Weeks   Status Achieved     PT SHORT TERM GOAL #2   Title Pt will report 25% reduced R LE radicular sxs to ankle   Baseline 30-40%   Time 3   Period Weeks   Status Achieved     PT SHORT TERM GOAL #3  Baseline 1.5 hours   Time 3   Period Weeks   Status Achieved     PT SHORT TERM GOAL #4   Title Pt will not be woken at night by painful throbbing of foot   Baseline not woken by foot   Time 3   Period Weeks   Status Achieved           PT Long Term Goals - 03/23/17 1159      PT LONG TERM GOAL #1   Title Pt will be I with advanced HEP by 7/13   Baseline poor performance due to hip pain    Time 4   Period Weeks   Status On-going     PT LONG TERM GOAL #2   Title Pt will report overall LBP </= 3/10 with all functional activities   Baseline 7/10   Time 4   Period Weeks   Status On-going     PT LONG TERM GOAL #3   Title Pt will report decreased R LE radicular sxs x 75% with all functional activities   Baseline some decrease noted   Time 4   Period Weeks   Status On-going     PT LONG TERM GOAL #4   Title Pt will be able to sit x 1 hour with 3/10 LBP to watch a movie   Baseline unable to sit 1 hr due to pain   Time 4   Period Weeks   Status On-going               Plan - 03/31/17 0981    Clinical Impression Statement Pt reports she felt some relief after TPDN treatment. She c/o 8/10 central lumbar pain without radicular symptoms. We worked on hip  ROM and core/hip strength. Right hip tightness noted. Performed sit-stands and squats with good form and increased weight bearing to RLE.    PT Next Visit Plan DN effectiveness?, core engagement, hip stretching, sit<>stand, functional squat/lift   PT Home Exercise Plan figure 4 stretch, hooklying pelvic tilt with pillow squeeze;    Consulted and Agree with Plan of Care Patient      Patient will benefit from skilled therapeutic intervention in order to improve the following deficits and impairments:  Decreased mobility, Decreased strength, Hypomobility, Difficulty walking, Increased muscle spasms, Impaired flexibility, Improper body mechanics  Visit Diagnosis: Acute right-sided low back pain with right-sided sciatica  Pain in right hip  Abnormal posture  Pain in right foot  Difficulty in walking, not elsewhere classified     Problem List Patient Active Problem List   Diagnosis Date Noted  . Seasonal allergies 01/21/2015  . OAB (overactive bladder) 01/21/2015  . Dyslipidemia 02/14/2014  . Obesity 12/19/2009  . LOW BACK PAIN 03/18/2008  . DEPRESSION/ANXIETY 06/15/2007  . HYPERTENSION, BENIGN 06/15/2007    Dorene Ar, PTA 03/31/2017, 9:30 AM  [place], [place], [address] Phone: [phone]   Fax:  [phone]  Name: Rachel Fields MRN: [id number] Date of Birth: [birth year]

## 2017-04-01 ENCOUNTER — Encounter: Payer: Self-pay | Admitting: Physical Therapy

## 2017-04-01 ENCOUNTER — Ambulatory Visit: Payer: 59 | Admitting: Physical Therapy

## 2017-04-01 DIAGNOSIS — R293 Abnormal posture: Secondary | ICD-10-CM

## 2017-04-01 DIAGNOSIS — R262 Difficulty in walking, not elsewhere classified: Secondary | ICD-10-CM

## 2017-04-01 DIAGNOSIS — M5441 Lumbago with sciatica, right side: Secondary | ICD-10-CM

## 2017-04-01 DIAGNOSIS — M25551 Pain in right hip: Secondary | ICD-10-CM | POA: Diagnosis not present

## 2017-04-01 DIAGNOSIS — M79671 Pain in right foot: Secondary | ICD-10-CM | POA: Diagnosis not present

## 2017-04-01 NOTE — Therapy (Signed)
[place], [place], [address] Phone: [phone]   Fax:  [phone]  Physical Therapy Treatment  Patient Details  Name: Rachel Fields MRN: [id number] Date of Birth: 1956/03/01 Referring Provider: Verner Chol, MD  Encounter Date: 04/01/2017      PT End of Session - 04/01/17 0856    Visit Number 10   Number of Visits 15   Date for PT Re-Evaluation 04/22/17   Authorization Type [facility]   PT Start Time [phone]  pt arrived late   PT Stop Time 0931   PT Time Calculation (min) 36 min   Activity Tolerance Patient tolerated treatment well   Behavior During Therapy [facility] for tasks assessed/performed      Past Medical History:  Diagnosis Date  . Allergy   . Anxiety   . Anxiety and depression   . Arthritis   . Depression   . HTN (hypertension)   . Hx of iron deficiency anemia    reports  trait; reports on iron her whole life  . Low back pain    chronic, reports hx DDD treated by chiropractor  . No blood products    Jehovah's Witness  . OAB (overactive bladder)   . Seasonal allergies   . Sickle cell trait ([place])   . Uterine fibroid     Past Surgical History:  Procedure Laterality Date  . BUNIONECTOMY Bilateral 1987  . CESAREAN SECTION    . Teeth extracted   01/14/2017  . TUBAL LIGATION    . TUMOR REMOVAL     behind left eye    There were no vitals filed for this visit.      Subjective Assessment - 04/01/17 0856    Subjective Reports back feels much better than yesterday. Tossed and turned all night but was just uncomfortable rather than in pain.    Currently in Pain? Yes   Pain Score 6    Pain Location Back   Pain Orientation Lower;Mid   Pain Descriptors / Indicators --  discomfort                         [facility] Adult PT Treatment/Exercise - 04/01/17 0001      Exercises   Exercises Lumbar     Lumbar Exercises: Stretches   Active Hamstring Stretch Limitations  both x10   Single Knee to Chest Stretch 10 seconds   Single Knee to Chest Stretch Limitations both   Standing Side Bend Limitations QL stretch in door   Quadruped Mid Back Stretch Limitations cat camel child pose     Lumbar Exercises: Machines for Strengthening   Elliptical 5 min L1 ramp 10     Lumbar Exercises: Standing   Other Standing Lumbar Exercises standing chops 3#     Lumbar Exercises: Supine   Large Ball Abdominal Isometric 5 reps   Large Ball Abdominal Isometric Limitations 10s holds, cues to breathe   Other Supine Lumbar Exercises 5x table top UE press                  PT Short Term Goals - 02/10/17 1029      PT SHORT TERM GOAL #1   Title Pt will be I with initial HEP   Baseline independent   Time 3   Period Weeks   Status Achieved     PT SHORT TERM GOAL #2   Title Pt will report 25% reduced R LE radicular sxs  to ankle   Baseline 30-40%   Time 3   Period Weeks   Status Achieved     PT SHORT TERM GOAL #3   Baseline 1.5 hours   Time 3   Period Weeks   Status Achieved     PT SHORT TERM GOAL #4   Title Pt will not be woken at night by painful throbbing of foot   Baseline not woken by foot   Time 3   Period Weeks   Status Achieved           PT Long Term Goals - 03/23/17 1159      PT LONG TERM GOAL #1   Title Pt will be I with advanced HEP by 7/13   Baseline poor performance due to hip pain    Time 4   Period Weeks   Status On-going     PT LONG TERM GOAL #2   Title Pt will report overall LBP </= 3/10 with all functional activities   Baseline 7/10   Time 4   Period Weeks   Status On-going     PT LONG TERM GOAL #3   Title Pt will report decreased R LE radicular sxs x 75% with all functional activities   Baseline some decrease noted   Time 4   Period Weeks   Status On-going     PT LONG TERM GOAL #4   Title Pt will be able to sit x 1 hour with 3/10 LBP to watch a movie   Baseline unable to sit 1 hr due to pain   Time 4    Period Weeks   Status On-going               Plan - 04/01/17 0932    Clinical Impression Statement Pt reportsed feeling much better today. Progressed exercises from last visit to incorporate functional movements and compare to daily activities.    PT Treatment/Interventions ADLs/Self Care Home Management;Cryotherapy;Electrical Stimulation;Iontophoresis 4mg /ml Dexamethasone;Moist Heat;Traction;Ultrasound;Functional mobility training;Therapeutic activities;Therapeutic exercise;Patient/family education;Passive range of motion;Manual techniques;Dry needling;Taping   PT Next Visit Plan core engagement, hip stretching, sit<>stand, functional squat/lift   PT Home Exercise Plan figure 4 stretch, hooklying pelvic tilt with pillow squeeze;    Consulted and Agree with Plan of Care Patient      Patient will benefit from skilled therapeutic intervention in order to improve the following deficits and impairments:  Decreased mobility, Decreased strength, Hypomobility, Difficulty walking, Increased muscle spasms, Impaired flexibility, Improper body mechanics  Visit Diagnosis: Acute right-sided low back pain with right-sided sciatica  Pain in right hip  Abnormal posture  Pain in right foot  Difficulty in walking, not elsewhere classified     Problem List Patient Active Problem List   Diagnosis Date Noted  . Seasonal allergies 01/21/2015  . OAB (overactive bladder) 01/21/2015  . Dyslipidemia 02/14/2014  . Obesity 12/19/2009  . LOW BACK PAIN 03/18/2008  . DEPRESSION/ANXIETY 06/15/2007  . HYPERTENSION, BENIGN 06/15/2007    Briyah Wheelwright C. Aaniyah Strohm PT, DPT 04/01/17 9:34 AM   [facility] [address] [place], [place], [address] Phone: [phone]   Fax:  [phone]  Name: Rachel Fields MRN: [id number] Date of Birth: Jan 21, 1956

## 2017-04-05 ENCOUNTER — Other Ambulatory Visit: Payer: Self-pay | Admitting: Family Medicine

## 2017-04-05 ENCOUNTER — Ambulatory Visit: Payer: 59 | Admitting: Physical Therapy

## 2017-04-05 ENCOUNTER — Other Ambulatory Visit: Payer: Self-pay | Admitting: *Deleted

## 2017-04-05 ENCOUNTER — Encounter: Payer: Self-pay | Admitting: Physical Therapy

## 2017-04-05 DIAGNOSIS — R262 Difficulty in walking, not elsewhere classified: Secondary | ICD-10-CM | POA: Diagnosis not present

## 2017-04-05 DIAGNOSIS — M79671 Pain in right foot: Secondary | ICD-10-CM | POA: Diagnosis not present

## 2017-04-05 DIAGNOSIS — M25551 Pain in right hip: Secondary | ICD-10-CM | POA: Diagnosis not present

## 2017-04-05 DIAGNOSIS — M5441 Lumbago with sciatica, right side: Secondary | ICD-10-CM

## 2017-04-05 DIAGNOSIS — R293 Abnormal posture: Secondary | ICD-10-CM | POA: Diagnosis not present

## 2017-04-05 DIAGNOSIS — Z1211 Encounter for screening for malignant neoplasm of colon: Secondary | ICD-10-CM

## 2017-04-05 MED ORDER — LISINOPRIL 10 MG PO TABS: 10.0000 mg | ORAL_TABLET | Freq: Every day | ORAL | 1 refills | Status: DC

## 2017-04-05 MED FILL — FLUTICASONE PROP 50 MCG SPR: 50 | 60 days supply | Qty: 16 | Fill #0

## 2017-04-05 MED FILL — TOLTERODINE TART ER 4 MG CA: 4 | 30 days supply | Qty: 30 | Fill #0

## 2017-04-05 NOTE — Telephone Encounter (Signed)
I called the pt to verify the dose of Lisinopril that she is taking as the pharmacy sent a request for Lisinopril 5mg .  Patient stated she is taking 10mg  and she is aware the Rx was sent to her pharmacy.  Patient also requested a new referral for GI as she did not go when the referral was placed in September 2017.  A new referral was entered and I advised she call [facility] for an appt and she agreed.

## 2017-04-05 NOTE — Therapy (Signed)
[place], [place], [address] Phone: [phone]   Fax:  [phone]  Physical Therapy Treatment  Patient Details  Name: Rachel Fields MRN: [id number] Date of Birth: 1956-02-17 Referring Provider: Verner Chol, MD  Encounter Date: 04/05/2017      PT End of Session - 04/05/17 1151    Visit Number 11   Number of Visits 15   Date for PT Re-Evaluation 04/22/17   Authorization Type [facility]   PT Start Time 1150   PT Stop Time 1231   PT Time Calculation (min) 41 min   Activity Tolerance Patient tolerated treatment well   Behavior During Therapy [facility] for tasks assessed/performed      Past Medical History:  Diagnosis Date  . Allergy   . Anxiety   . Anxiety and depression   . Arthritis   . Depression   . HTN (hypertension)   . Hx of iron deficiency anemia    reports [place] trait; reports on iron her whole life  . Low back pain    chronic, reports hx DDD treated by chiropractor  . No blood products    Jehovah's Witness  . OAB (overactive bladder)   . Seasonal allergies   . Sickle cell trait ([place])   . Uterine fibroid     Past Surgical History:  Procedure Laterality Date  . BUNIONECTOMY Bilateral 1987  . CESAREAN SECTION    . Teeth extracted   01/14/2017  . TUBAL LIGATION    . TUMOR REMOVAL     behind left eye    There were no vitals filed for this visit.      Subjective Assessment - 04/05/17 1152    Subjective Pain in R SIJ that began last night. L side felt much better after DN.    Patient Stated Goals to be able to perform ADLs without pain and return to work   Currently in Pain? Yes   Pain Score 7    Pain Location --  SIJ   Pain Orientation Right;Lower                         [facility] Adult PT Treatment/Exercise - 04/05/17 0001      Knee/Hip Exercises: Standing   Other Standing Knee Exercises heel strike with trunk rotation     Knee/Hip Exercises: Seated   Ball  Squeeze with cues for posture   Other Seated Knee/Hip Exercises anterior/post pelvic tilt     Knee/Hip Exercises: Prone   Hip Extension Limitations prone glut sets, prone ball squeeze b/w feet with knees flexed     Ultrasound   Ultrasound Location R SIJ   Ultrasound Parameters 1.0 w/cm2 cont 5 min   Ultrasound Goals Pain     Iontophoresis   Type of Iontophoresis Dexamethasone   Location R SIJ   Dose 1cc   Time 6 hr wear time     Manual Therapy   Manual Therapy Joint mobilization   Joint Mobilization PA R SIJ gr 2&3, prone & prone on pillows                  PT Short Term Goals - 02/10/17 1029      PT SHORT TERM GOAL #1   Title Pt will be I with initial HEP   Baseline independent   Time 3   Period Weeks   Status Achieved     PT SHORT TERM GOAL #2  Title Pt will report 25% reduced R LE radicular sxs to ankle   Baseline 30-40%   Time 3   Period Weeks   Status Achieved     PT SHORT TERM GOAL #3   Baseline 1.5 hours   Time 3   Period Weeks   Status Achieved     PT SHORT TERM GOAL #4   Title Pt will not be woken at night by painful throbbing of foot   Baseline not woken by foot   Time 3   Period Weeks   Status Achieved           PT Long Term Goals - 03/23/17 1159      PT LONG TERM GOAL #1   Title Pt will be I with advanced HEP by 7/13   Baseline poor performance due to hip pain    Time 4   Period Weeks   Status On-going     PT LONG TERM GOAL #2   Title Pt will report overall LBP </= 3/10 with all functional activities   Baseline 7/10   Time 4   Period Weeks   Status On-going     PT LONG TERM GOAL #3   Title Pt will report decreased R LE radicular sxs x 75% with all functional activities   Baseline some decrease noted   Time 4   Period Weeks   Status On-going     PT LONG TERM GOAL #4   Title Pt will be able to sit x 1 hour with 3/10 LBP to watch a movie   Baseline unable to sit 1 hr due to pain   Time 4   Period Weeks   Status  On-going               Plan - 04/05/17 1243    Clinical Impression Statement L sacral tilt noted today which was corrected with manual therapy. Pt reported feeling better after treatment, was able to stand from chair with mild soreness rather than sharp pain. Will continue to benefit from SIJ stabilization    PT Treatment/Interventions ADLs/Self Care Home Management;Cryotherapy;Electrical Stimulation;Iontophoresis 4mg /ml Dexamethasone;Moist Heat;Traction;Ultrasound;Functional mobility training;Therapeutic activities;Therapeutic exercise;Patient/family education;Passive range of motion;Manual techniques;Dry needling;Taping   PT Next Visit Plan SIJ pain today?  lumbopelvic stability   PT Home Exercise Plan figure 4 stretch, hooklying pelvic tilt with pillow squeeze; seated ant/post pelvic tilt, heel strike in gait   Consulted and Agree with Plan of Care Patient      Patient will benefit from skilled therapeutic intervention in order to improve the following deficits and impairments:  Decreased mobility, Decreased strength, Hypomobility, Difficulty walking, Increased muscle spasms, Impaired flexibility, Improper body mechanics  Visit Diagnosis: Acute right-sided low back pain with right-sided sciatica  Pain in right hip     Problem List Patient Active Problem List   Diagnosis Date Noted  . Seasonal allergies 01/21/2015  . OAB (overactive bladder) 01/21/2015  . Dyslipidemia 02/14/2014  . Obesity 12/19/2009  . LOW BACK PAIN 03/18/2008  . DEPRESSION/ANXIETY 06/15/2007  . HYPERTENSION, BENIGN 06/15/2007    Shaketta Rill C. Justyne Roell PT, DPT 04/05/17 12:47 PM   [facility] [address] [place], [place], [address] Phone: [phone]   Fax:  [phone]  Name: Rachel Fields MRN: [id number] Date of Birth: [birth year]

## 2017-04-06 MED FILL — LISINOPRIL 10 MG TABLET: 10 | 90 days supply | Qty: 90 | Fill #0

## 2017-04-06 MED FILL — VENLAFAXINE HCL ER 75 MG CA: 75 | 30 days supply | Qty: 90 | Fill #0

## 2017-04-07 ENCOUNTER — Ambulatory Visit: Payer: 59 | Admitting: Physical Therapy

## 2017-04-07 ENCOUNTER — Encounter: Payer: Self-pay | Admitting: Physical Therapy

## 2017-04-07 DIAGNOSIS — M79671 Pain in right foot: Secondary | ICD-10-CM | POA: Diagnosis not present

## 2017-04-07 DIAGNOSIS — M5441 Lumbago with sciatica, right side: Secondary | ICD-10-CM

## 2017-04-07 DIAGNOSIS — R293 Abnormal posture: Secondary | ICD-10-CM

## 2017-04-07 DIAGNOSIS — M25551 Pain in right hip: Secondary | ICD-10-CM | POA: Diagnosis not present

## 2017-04-07 DIAGNOSIS — R262 Difficulty in walking, not elsewhere classified: Secondary | ICD-10-CM

## 2017-04-12 ENCOUNTER — Encounter: Payer: Self-pay | Admitting: Physical Therapy

## 2017-04-12 ENCOUNTER — Ambulatory Visit: Payer: 59 | Attending: Orthopedic Surgery | Admitting: Physical Therapy

## 2017-04-12 DIAGNOSIS — R293 Abnormal posture: Secondary | ICD-10-CM | POA: Insufficient documentation

## 2017-04-12 DIAGNOSIS — M25551 Pain in right hip: Secondary | ICD-10-CM | POA: Insufficient documentation

## 2017-04-12 DIAGNOSIS — M79671 Pain in right foot: Secondary | ICD-10-CM | POA: Diagnosis not present

## 2017-04-12 DIAGNOSIS — R262 Difficulty in walking, not elsewhere classified: Secondary | ICD-10-CM | POA: Insufficient documentation

## 2017-04-12 DIAGNOSIS — M5441 Lumbago with sciatica, right side: Secondary | ICD-10-CM | POA: Diagnosis not present

## 2017-04-12 NOTE — Therapy (Signed)
[place], [place], [address] Phone: [phone]   Fax:  [phone]  Physical Therapy Treatment  Patient Details  Name: Rachel Fields MRN: [id number] Date of Birth: 24-Aug-1956 Referring Provider: Verner Chol, MD  Encounter Date: 04/12/2017      PT End of Session - 04/12/17 1117    Visit Number 13   Number of Visits 15   Date for PT Re-Evaluation 04/22/17   Authorization Type [facility]   PT Start Time 1106  pt arrived late   PT Stop Time 1148   PT Time Calculation (min) 42 min   Activity Tolerance Patient tolerated treatment well   Behavior During Therapy [facility] for tasks assessed/performed      Past Medical History:  Diagnosis Date  . Allergy   . Anxiety   . Anxiety and depression   . Arthritis   . Depression   . HTN (hypertension)   . Hx of iron deficiency anemia    reports [place] trait; reports on iron her whole life  . Low back pain    chronic, reports hx DDD treated by chiropractor  . No blood products    Jehovah's Witness  . OAB (overactive bladder)   . Seasonal allergies   . Sickle cell trait ([place])   . Uterine fibroid     Past Surgical History:  Procedure Laterality Date  . BUNIONECTOMY Bilateral 1987  . CESAREAN SECTION    . Teeth extracted   01/14/2017  . TUBAL LIGATION    . TUMOR REMOVAL     behind left eye    There were no vitals filed for this visit.      Subjective Assessment - 04/12/17 1116    Subjective Pain centralized to R SIJ, occasionally feels it radiate to her R. 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 Yes   Pain Score 4    Pain Location --  SIJ   Pain Orientation Right   Pain Descriptors / Indicators Sore                         [facility] Adult PT Treatment/Exercise - 04/12/17 0001      Knee/Hip Exercises: Stretches   Hip Flexor Stretch Limitations thomas test stretch bilat   Piriformis Stretch Limitations  figure 4 bilat     Knee/Hip Exercises: Aerobic   Stationary Bike 5 min L3     Ultrasound   Ultrasound Location R SIJ   Ultrasound Parameters 1.0 w/cm2 8 min   Ultrasound Goals Pain     Iontophoresis   Type of Iontophoresis Dexamethasone   Location R SIJ   Dose 1cc   Time 6 hr wear time                PT Education - 04/12/17 1246    Education provided Yes   Education Details muscle pain v nerve pain. centralization of pain to SIJ, sleeping posture   Person(s) Educated Patient   Methods Explanation   Comprehension Verbalized understanding;Need further instruction          PT Short Term Goals - 02/10/17 1029      PT SHORT TERM GOAL #1   Title Pt will be I with initial HEP   Baseline independent   Time 3   Period Weeks   Status Achieved     PT SHORT TERM GOAL #2   Title Pt will  report 25% reduced R LE radicular sxs to ankle   Baseline 30-40%   Time 3   Period Weeks   Status Achieved     PT SHORT TERM GOAL #3   Baseline 1.5 hours   Time 3   Period Weeks   Status Achieved     PT SHORT TERM GOAL #4   Title Pt will not be woken at night by painful throbbing of foot   Baseline not woken by foot   Time 3   Period Weeks   Status Achieved           PT Long Term Goals - 03/23/17 1159      PT LONG TERM GOAL #1   Title Pt will be I with advanced HEP by 7/13   Baseline poor performance due to hip pain    Time 4   Period Weeks   Status On-going     PT LONG TERM GOAL #2   Title Pt will report overall LBP </= 3/10 with all functional activities   Baseline 7/10   Time 4   Period Weeks   Status On-going     PT LONG TERM GOAL #3   Title Pt will report decreased R LE radicular sxs x 75% with all functional activities   Baseline some decrease noted   Time 4   Period Weeks   Status On-going     PT LONG TERM GOAL #4   Title Pt will be able to sit x 1 hour with 3/10 LBP to watch a movie   Baseline unable to sit 1 hr due to pain   Time 4   Period  Weeks   Status On-going               Plan - 04/12/17 1245    Clinical Impression Statement Pt reported feeling fine after treatment today. Discussed use of tennis ball for trigger point release at home.    PT Treatment/Interventions ADLs/Self Care Home Management;Cryotherapy;Electrical Stimulation;Iontophoresis 4mg /ml Dexamethasone;Moist Heat;Traction;Ultrasound;Functional mobility training;Therapeutic activities;Therapeutic exercise;Patient/family education;Passive range of motion;Manual techniques;Dry needling;Taping   PT Next Visit Plan lumbopelvic stability   PT Home Exercise Plan figure 4 stretch, hooklying pelvic tilt with pillow squeeze; seated ant/post pelvic tilt, heel strike in gait   Consulted and Agree with Plan of Care Patient      Patient will benefit from skilled therapeutic intervention in order to improve the following deficits and impairments:  Decreased mobility, Decreased strength, Hypomobility, Difficulty walking, Increased muscle spasms, Impaired flexibility, Improper body mechanics  Visit Diagnosis: Acute right-sided low back pain with right-sided sciatica  Pain in right hip     Problem List Patient Active Problem List   Diagnosis Date Noted  . Seasonal allergies 01/21/2015  . OAB (overactive bladder) 01/21/2015  . Dyslipidemia 02/14/2014  . Obesity 12/19/2009  . LOW BACK PAIN 03/18/2008  . DEPRESSION/ANXIETY 06/15/2007  . HYPERTENSION, BENIGN 06/15/2007    Luqman Perrelli C. Arwa Yero PT, DPT 04/12/17 12:49 PM   [facility] [address] [place], [place], [address] Phone: [phone]   Fax:  [phone]  Name: Aniesha Haughn MRN: [id number] Date of Birth: [birth year]

## 2017-04-12 NOTE — Therapy (Signed)
[place], [place], [address] Phone: [phone]   Fax:  [phone]  Physical Therapy Treatment  Patient Details  Name: Rachel Fields MRN: [id number] Date of Birth: 1956-09-23 Referring Provider: Verner Chol, MD  Encounter Date: 04/07/2017    Past Medical History:  Diagnosis Date  . Allergy   . Anxiety   . Anxiety and depression   . Arthritis   . Depression   . HTN (hypertension)   . Hx of iron deficiency anemia    reports [place] trait; reports on iron her whole life  . Low back pain    chronic, reports hx DDD treated by chiropractor  . No blood products    Jehovah's Witness  . OAB (overactive bladder)   . Seasonal allergies   . Sickle cell trait ([place])   . Uterine fibroid     Past Surgical History:  Procedure Laterality Date  . BUNIONECTOMY Bilateral 1987  . CESAREAN SECTION    . Teeth extracted   01/14/2017  . TUBAL LIGATION    . TUMOR REMOVAL     behind left eye    There were no vitals filed for this visit.                                 PT Short Term Goals - 02/10/17 1029      PT SHORT TERM GOAL #1   Title Pt will be I with initial HEP   Baseline independent   Time 3   Period Weeks   Status Achieved     PT SHORT TERM GOAL #2   Title Pt will report 25% reduced R LE radicular sxs to ankle   Baseline 30-40%   Time 3   Period Weeks   Status Achieved     PT SHORT TERM GOAL #3   Baseline 1.5 hours   Time 3   Period Weeks   Status Achieved     PT SHORT TERM GOAL #4   Title Pt will not be woken at night by painful throbbing of foot   Baseline not woken by foot   Time 3   Period Weeks   Status Achieved           PT Long Term Goals - 03/23/17 1159      PT LONG TERM GOAL #1   Title Pt will be I with advanced HEP by 7/13   Baseline poor performance due to hip pain    Time 4   Period Weeks   Status On-going     PT LONG TERM GOAL #2    Title Pt will report overall LBP </= 3/10 with all functional activities   Baseline 7/10   Time 4   Period Weeks   Status On-going     PT LONG TERM GOAL #3   Title Pt will report decreased R LE radicular sxs x 75% with all functional activities   Baseline some decrease noted   Time 4   Period Weeks   Status On-going     PT LONG TERM GOAL #4   Title Pt will be able to sit x 1 hour with 3/10 LBP to watch a movie   Baseline unable to sit 1 hr due to pain   Time 4   Period Weeks   Status On-going             Patient will benefit  from skilled therapeutic intervention in order to improve the following deficits and impairments:     Visit Diagnosis: Acute right-sided low back pain with right-sided sciatica  Pain in right hip  Abnormal posture  Pain in right foot  Difficulty in walking, not elsewhere classified     Problem List Patient Active Problem List   Diagnosis Date Noted  . Seasonal allergies 01/21/2015  . OAB (overactive bladder) 01/21/2015  . Dyslipidemia 02/14/2014  . Obesity 12/19/2009  . LOW BACK PAIN 03/18/2008  . DEPRESSION/ANXIETY 06/15/2007  . HYPERTENSION, BENIGN 06/15/2007    Praveen Coia PTA 04/12/2017, 11:08 AM  [facility] [address] [place], [place], [address] Phone: [phone]   Fax:  [phone]  Name: Rachel Fields MRN: [id number] Date of Birth: 1955/10/21

## 2017-04-19 ENCOUNTER — Ambulatory Visit: Payer: 59 | Admitting: Physical Therapy

## 2017-04-21 ENCOUNTER — Encounter: Payer: Self-pay | Admitting: Physical Therapy

## 2017-04-21 ENCOUNTER — Ambulatory Visit: Payer: 59 | Admitting: Physical Therapy

## 2017-04-21 DIAGNOSIS — M25551 Pain in right hip: Secondary | ICD-10-CM

## 2017-04-21 DIAGNOSIS — M5441 Lumbago with sciatica, right side: Secondary | ICD-10-CM | POA: Diagnosis not present

## 2017-04-21 DIAGNOSIS — M79671 Pain in right foot: Secondary | ICD-10-CM | POA: Diagnosis not present

## 2017-04-21 DIAGNOSIS — R293 Abnormal posture: Secondary | ICD-10-CM | POA: Diagnosis not present

## 2017-04-21 DIAGNOSIS — R262 Difficulty in walking, not elsewhere classified: Secondary | ICD-10-CM

## 2017-04-21 NOTE — Patient Instructions (Signed)
Keep up the exercises and good posture techniques.

## 2017-04-21 NOTE — Therapy (Signed)
[place], [place], [address] Phone: [phone]   Fax:  [phone]  Physical Therapy Treatment/Discharge Summary  Patient Details  Name: Rachel Fields MRN: [id number] Date of Birth: 05/17/1956 Referring Provider: Verner Chol, MD  Encounter Date: 04/21/2017      PT End of Session - 04/21/17 1136    Visit Number 14   Number of Visits 15   Date for PT Re-Evaluation 04/22/17   PT Start Time 1105  charge will not equal time slot patient wanted modalities only, and arrived late.  She was on her phone talking for part of the session.   PT Stop Time 1144   PT Time Calculation (min) 39 min   Activity Tolerance Patient tolerated treatment well   Behavior During Therapy WFL for tasks assessed/performed      Past Medical History:  Diagnosis Date  . Allergy   . Anxiety   . Anxiety and depression   . Arthritis   . Depression   . HTN (hypertension)   . Hx of iron deficiency anemia    reports [place] trait; reports on iron her whole life  . Low back pain    chronic, reports hx DDD treated by chiropractor  . No blood products    Jehovah's Witness  . OAB (overactive bladder)   . Seasonal allergies   . Sickle cell trait ([place])   . Uterine fibroid     Past Surgical History:  Procedure Laterality Date  . BUNIONECTOMY Bilateral 1987  . CESAREAN SECTION    . Teeth extracted   01/14/2017  . TUBAL LIGATION    . TUMOR REMOVAL     behind left eye    There were no vitals filed for this visit.      Subjective Assessment - 04/21/17 1133    Subjective Pain continues to be centralized.  I understand what to to if i get a pain flare.  I plan to keep up the exercises.    Currently in Pain? Yes   Pain Score 4    Pain Location --  SIJ right   Pain Orientation Right   Pain Descriptors / Indicators Sore   Pain Type Acute pain   Pain Radiating Towards None   Pain Frequency Intermittent   Aggravating Factors   hard to stay comfortable at night,    Pain Relieving Factors resting with pillows   Effect of Pain on Daily Activities Limits sleeping                         [facility] Adult PT Treatment/Exercise - 04/21/17 0001      Self-Care   Self-Care --  sleeping posture, use of pillows may tryprone  with 2 pillow     Moist Heat Therapy   Number Minutes Moist Heat 15 Minutes   Moist Heat Location Lumbar Spine     Ultrasound   Ultrasound Location R SIJ   Ultrasound Parameters 1 watt/cm2, 8 minutes   Ultrasound Goals Pain     Iontophoresis   Type of Iontophoresis Dexamethasone   Location R SIJ   Dose 1cc   Time 6 hour wear time                  PT Short Term Goals - 02/10/17 1029      PT SHORT TERM GOAL #1   Title Pt will be I with initial HEP   Baseline independent   Time  3   Period Weeks   Status Achieved     PT SHORT TERM GOAL #2   Title Pt will report 25% reduced R LE radicular sxs to ankle   Baseline 30-40%   Time 3   Period Weeks   Status Achieved     PT SHORT TERM GOAL #3   Baseline 1.5 hours   Time 3   Period Weeks   Status Achieved     PT SHORT TERM GOAL #4   Title Pt will not be woken at night by painful throbbing of foot   Baseline not woken by foot   Time 3   Period Weeks   Status Achieved           PT Long Term Goals - 04/21/17 1315      PT LONG TERM GOAL #1   Title Pt will be I with advanced HEP by 7/13   Baseline Independent per patient   Time 4   Period Weeks   Status Achieved     PT LONG TERM GOAL #2   Title Pt will report overall LBP </= 3/10 with all functional activities   Baseline 4/10 pain   Time 4   Period Weeks   Status Not Met     PT LONG TERM GOAL #3   Title Pt will report decreased R LE radicular sxs x 75% with all functional activities   Baseline no leg sxs   Time 4   Period Weeks   Status Achieved     PT LONG TERM GOAL #4   Title Pt will be able to sit x 1 hour with 3/10 LBP to watch a movie    Baseline able to sit 1.5 hour with pain 3/10   Time 4   Period Weeks   Status Achieved               Plan - 04/21/17 1138    Clinical Impression Statement She feels she is ready for discharge.  There is no FOTO in system.  LTG#1, #3, met.  LTG#2 not met pain 4/10 with ADL.     PT Treatment/Interventions ADLs/Self Care Home Management;Cryotherapy;Electrical Stimulation;Iontophoresis 66m/ml Dexamethasone;Moist Heat;Traction;Ultrasound;Functional mobility training;Therapeutic activities;Therapeutic exercise;Patient/family education;Passive range of motion;Manual techniques;Dry needling;Taping   PT Next Visit Plan Discharge at patient's request, She is happy with the results.     PT Home Exercise Plan figure 4 stretch, hooklying pelvic tilt with pillow squeeze; seated ant/post pelvic tilt, heel strike in gait   Consulted and Agree with Plan of Care Patient      Patient will benefit from skilled therapeutic intervention in order to improve the following deficits and impairments:     Visit Diagnosis: Acute right-sided low back pain with right-sided sciatica  Pain in right hip  Abnormal posture  Pain in right foot  Difficulty in walking, not elsewhere classified     Problem List Patient Active Problem List   Diagnosis Date Noted  . Seasonal allergies 01/21/2015  . OAB (overactive bladder) 01/21/2015  . Dyslipidemia 02/14/2014  . Obesity 12/19/2009  . LOW BACK PAIN 03/18/2008  . DEPRESSION/ANXIETY 06/15/2007  . HYPERTENSION, BENIGN 06/15/2007    HARRIS,KAREN PTA 04/21/2017, 1:23 PM  [facility] [address]: [phone]  Fax:  [phone] Name: Rachel FritscheMRN: [id number] of Birth: 7Jan 15, 1957 PHYSICAL THERAPY DISCHARGE SUMMARY  Visits from Start of Care: 14  Current functional level related to goals / functional  outcomes: See above   Remaining deficits: See  above   Education / Equipment: Anatomy of condition, POC, HEP, exercise form/rationale  Plan: Patient agrees to discharge.  Patient goals were not met. Patient is being discharged due to the patient's request.  ?????    Jessica C. Hightower PT, DPT 04/21/17 3:32 PM

## 2017-05-02 MED FILL — VENLAFAXINE HCL ER 75 MG CA: 75 | 30 days supply | Qty: 90 | Fill #1

## 2017-05-17 ENCOUNTER — Ambulatory Visit: Payer: 59 | Admitting: Physical Therapy

## 2017-05-26 ENCOUNTER — Ambulatory Visit: Payer: 59 | Admitting: Physical Therapy

## 2017-06-06 ENCOUNTER — Ambulatory Visit: Payer: 59 | Admitting: Physical Therapy

## 2017-06-09 ENCOUNTER — Other Ambulatory Visit: Payer: Self-pay | Admitting: Family Medicine

## 2017-06-09 MED FILL — VENLAFAXINE HCL ER 75 MG CA: 75 | 30 days supply | Qty: 90 | Fill #0

## 2017-06-30 ENCOUNTER — Encounter: Payer: Self-pay | Admitting: Family Medicine

## 2017-07-11 MED FILL — NAPROXEN 500 MG TABLET: 500 | 30 days supply | Qty: 30 | Fill #0

## 2017-07-11 MED FILL — METAXALONE 800 MG TABLET: 800 | 30 days supply | Qty: 30 | Fill #0

## 2017-07-13 ENCOUNTER — Telehealth: Payer: Self-pay | Admitting: *Deleted

## 2017-07-13 ENCOUNTER — Other Ambulatory Visit: Payer: Self-pay | Admitting: Family Medicine

## 2017-07-13 NOTE — Telephone Encounter (Signed)
Patient called to follow up on her refill request.     Please advise.

## 2017-07-13 NOTE — Telephone Encounter (Signed)
Patient called requesting a refill on her Eflexer, and the pill for her bladder she is unsure the name, blood pressure pills and something for pain. Please advise

## 2017-07-14 MED ORDER — LISINOPRIL 10 MG PO TABS: 10.0000 mg | ORAL_TABLET | Freq: Every day | ORAL | 0 refills | Status: DC

## 2017-07-14 MED ORDER — VENLAFAXINE HCL ER 75 MG PO CP24: ORAL_CAPSULE | ORAL | 0 refills | Status: DC

## 2017-07-14 MED ORDER — TOLTERODINE TARTRATE ER 4 MG PO CP24: 4.0000 mg | ORAL_CAPSULE | Freq: Every day | ORAL | 0 refills | Status: DC

## 2017-07-14 NOTE — Telephone Encounter (Signed)
I called the pt and she stated she has an appt on 10/12.  She  Is aware a 30-day supply of the Effexor, Detrol and Lisinopril was sent to her pharmacy.

## 2017-07-14 NOTE — Telephone Encounter (Signed)
We haven't seen her in quite some time and she no showed her last appt. Advise follow up visit.

## 2017-07-14 NOTE — Addendum Note (Signed)
Addended by: Agnes Lawrence on: 07/14/2017 12:15 PM   Modules accepted: Orders

## 2017-07-15 MED FILL — LISINOPRIL 10 MG TABS: 10 | 30 days supply | Qty: 30 | Fill #0

## 2017-07-15 MED FILL — VENLAFAXINE HCL ER 75 MG CA: 75 | 30 days supply | Qty: 90 | Fill #0

## 2017-07-15 MED FILL — FLUTICASONE PROP 50 MCG SPR: 50 | 60 days supply | Qty: 16 | Fill #1

## 2017-07-21 NOTE — Progress Notes (Deleted)
HPI:   Rachel Fields is a pleasant 61 y.o. here for follow up. Chronic medical problems summarized below were reviewed for changes. She has a history of poor compliance with follow-up recommendations, and has not been in to see [place] in some time. ***. Denies CP, SOB, DOE, treatment intolerance or new symptoms. Due for flu shot, labs and colon cancer screening  Hypertension: -Medications include lisinopril  Obesity/hyperlipidemia/hyperglycemia: -Weight 12/2016 204 -->  Anxiety and depression: -Medications include Effexor  Overactive bladder: -Medications: Detrol  Chronic low back pain: -Seeing specialist - Dr. Layne Benton and [facility] -Did a lot of physical therapy over the last year  Chronic Iron deficiency anemia: -Reports has had the surgery entire life  ROS: See pertinent positives and negatives per HPI.  Past Medical History:  Diagnosis Date  . Allergy   . Anxiety   . Anxiety and depression   . Arthritis   . Depression   . HTN (hypertension)   . Hx of iron deficiency anemia    reports [place] trait; reports on iron her whole life  . Low back pain    chronic, reports hx DDD treated by chiropractor  . No blood products    Jehovah's Witness  . OAB (overactive bladder)   . Seasonal allergies   . Sickle cell trait ([place])   . Uterine fibroid     Past Surgical History:  Procedure Laterality Date  . BUNIONECTOMY Bilateral 1987  . CESAREAN SECTION    . Teeth extracted   01/14/2017  . TUBAL LIGATION    . TUMOR REMOVAL     behind left eye    Family History  Problem Relation Age of Onset  . Diabetes Father   . Hypertension Father   . Dementia Father        senile  . Other Mother        smoker  . Lung cancer Mother        2010  . Bipolar disorder Sister   . Schizophrenia Sister   . Schizophrenia Sister   . Bipolar disorder Sister   . Schizophrenia Sister   . Bipolar disorder Sister   . Bipolar disorder Daughter   . Colon cancer Neg Hx     Social  History   Social History  . Marital status: Single    Spouse name: N/A  . Number of children: N/A  . Years of education: N/A   Social History Main Topics  . Smoking status: Never Smoker  . Smokeless tobacco: Never Used  . Alcohol use Yes     Comment: socially - rare; 2 drinks a few times per month  . Drug use: No  . Sexual activity: Not on file   Other Topics Concern  . Not on file   Social History Narrative   Work or School: CNA, working on [employer]      Home Situation: Lives with husband - married in 2015; hx of homelessness in 2015      Spiritual Beliefs: Jehovah's Witness      Lifestyle: no regular CV exercise; diet improving - reports she has been able to loose weight                       Current Outpatient Prescriptions:  .  calcium-vitamin D (CALCIUM + D) 250-125 MG-UNIT per tablet, Take 1 tablet by mouth daily., Disp: 30 tablet, Rfl: 3 .  cyclobenzaprine (FLEXERIL) 5 MG tablet, Take 1 tablet (5 mg total) by mouth  2 (two) times daily as needed for muscle spasms., Disp: 20 tablet, Rfl: 0 .  Fe Fum-FA-B Cmp-C-Zn-Mg-Mn-Cu (HEMOCYTE PLUS) 106-1 MG CAPS, Take 1 capsule by mouth daily., Disp: 30 each, Rfl: 3 .  fluticasone (FLONASE) 50 MCG/ACT nasal spray, Place 2 sprays into both nostrils daily. For 1 month, then 1 spray thereafter, Disp: 16 g, Rfl: 6 .  fluticasone (FLONASE) 50 MCG/ACT nasal spray, PLACE 1 SPRAY INTO BOTH NOSTRILS AT BEDTIME., Disp: 16 g, Rfl: 3 .  lisinopril (PRINIVIL,ZESTRIL) 10 MG tablet, Take 1 tablet (10 mg total) by mouth daily., Disp: 30 tablet, Rfl: 0 .  naproxen (NAPROSYN) 500 MG tablet, 1 tablet 1-2 times daily as needed for pain, Disp: 14 tablet, Rfl: 0 .  OVER THE COUNTER MEDICATION, Take 2 tablets by mouth daily. Biotin 5074mcg, Disp: , Rfl:  .  tolterodine (DETROL LA) 4 MG 24 hr capsule, Take 1 capsule (4 mg total) by mouth daily., Disp: 30 capsule, Rfl: 0 .  venlafaxine XR (EFFEXOR-XR) 75 MG 24 hr capsule, TAKE 3 CAPSULES BY MOUTH  DAILY WITH BREAKFAST, Disp: 90 capsule, Rfl: 0  EXAM:  There were no vitals filed for this visit.  There is no height or weight on file to calculate BMI.  GENERAL: vitals reviewed and listed above, alert, oriented, appears well hydrated and in no acute distress  HEENT: atraumatic, conjunttiva clear, no obvious abnormalities on inspection of external nose and ears  NECK: no obvious masses on inspection  LUNGS: clear to auscultation bilaterally, no wheezes, rales or rhonchi, good air movement  CV: HRRR, no peripheral edema  MS: moves all extremities without noticeable abnormality  PSYCH: pleasant and cooperative, no obvious depression or anxiety  ASSESSMENT AND PLAN:  Discussed the following assessment and plan:  No diagnosis found.  -Patient advised to return or notify a doctor immediately if symptoms worsen or persist or new concerns arise.  There are no Patient Instructions on file for this visit.  Colin Benton R., DO

## 2017-07-22 ENCOUNTER — Ambulatory Visit: Payer: Self-pay | Admitting: Family Medicine

## 2017-08-15 ENCOUNTER — Other Ambulatory Visit: Payer: Self-pay | Admitting: Family Medicine

## 2017-08-15 MED FILL — VENLAFAXINE HCL ER 75 MG CA: 75 | 5 days supply | Qty: 15 | Fill #0

## 2017-08-22 MED FILL — VENLAFAXINE HCL ER 75 MG CA: 75 | 5 days supply | Qty: 15 | Fill #1

## 2017-08-25 ENCOUNTER — Ambulatory Visit (INDEPENDENT_AMBULATORY_CARE_PROVIDER_SITE_OTHER): Payer: Self-pay | Admitting: Family Medicine

## 2017-08-25 ENCOUNTER — Encounter: Payer: Self-pay | Admitting: Family Medicine

## 2017-08-25 VITALS — BP 130/90 | HR 85 | Temp 98.0°F | Ht 67.0 in | Wt 211.7 lb

## 2017-08-25 DIAGNOSIS — N3281 Overactive bladder: Secondary | ICD-10-CM

## 2017-08-25 DIAGNOSIS — F341 Dysthymic disorder: Secondary | ICD-10-CM

## 2017-08-25 DIAGNOSIS — Z23 Encounter for immunization: Secondary | ICD-10-CM

## 2017-08-25 DIAGNOSIS — E6609 Other obesity due to excess calories: Secondary | ICD-10-CM

## 2017-08-25 DIAGNOSIS — R739 Hyperglycemia, unspecified: Secondary | ICD-10-CM

## 2017-08-25 DIAGNOSIS — E785 Hyperlipidemia, unspecified: Secondary | ICD-10-CM

## 2017-08-25 DIAGNOSIS — I10 Essential (primary) hypertension: Secondary | ICD-10-CM

## 2017-08-25 MED ORDER — TOLTERODINE TARTRATE ER 4 MG PO CP24: 4.0000 mg | ORAL_CAPSULE | Freq: Every day | ORAL | 1 refills | Status: DC

## 2017-08-25 MED ORDER — LISINOPRIL 20 MG PO TABS: 20.0000 mg | ORAL_TABLET | Freq: Every day | ORAL | 3 refills | Status: DC

## 2017-08-25 MED ORDER — VENLAFAXINE HCL ER 75 MG PO CP24: ORAL_CAPSULE | ORAL | 1 refills | Status: DC

## 2017-08-25 MED FILL — LISINOPRIL 20 MG TABS: 20 | 90 days supply | Qty: 90 | Fill #0

## 2017-08-25 NOTE — Patient Instructions (Addendum)
BEFORE YOU LEAVE: -Cologuard information, please recommend that she call us if she wishes for [place] to order this for a colonoscopy -Flu shot -Labs -PHQ 9 -follow up: Physical exam in 3 months months  And for recheck on the blood pressure   Increase the lisinopril to 20 mg daily.  We have ordered labs or studies at this visit. It can take up to 1-2 weeks for results and processing. IF results require follow up or explanation, we will call you with instructions. Clinically stable results will be released to your [facility]. If you have not heard from [place] or cannot find your results in [facility] in 2 weeks please contact our office at [phone].  If you are not yet signed up for [facility], please consider signing up.   We recommend the following healthy lifestyle for LIFE: 1) Small portions. But, make sure to get regular (at least 3 per day), healthy meals and small healthy snacks if needed.  2) Eat a healthy clean diet.   TRY TO EAT: -at least 5-7 servings of low sugar, colorful, and nutrient rich vegetables per day (not corn, potatoes or bananas.) -berries are the best choice if you wish to eat fruit (only eat small amounts if trying to reduce weight)  -lean meets (fish, white meat of chicken or [place]) -vegan proteins for some meals - beans or tofu, whole grains, nuts and seeds -Replace bad fats with good fats - good fats include: fish, nuts and seeds, canola oil, olive oil -small amounts of low fat or non fat dairy -small amounts of100 % whole grains - check the lables  AVOID: -SUGAR, sweets, anything with added sugar, corn syrup or sweeteners -if you must have a sweetener, small amounts of stevia may be best -sweetened beverages -simple starches (rice, bread, potatoes, pasta, chips, etc - small amounts of 100% whole grains are ok) -red meat, pork, butter -fried foods, fast food, processed food, excessive dairy, eggs and coconut.  3)Get at least 150 minutes of sweaty aerobic exercise  per week.  4)Reduce stress - consider counseling, meditation and relaxation to balance other aspects of your life.

## 2017-08-25 NOTE — Addendum Note (Signed)
Addended by: Agnes Lawrence on: 08/25/2017 01:55 PM   Modules accepted: Orders

## 2017-08-25 NOTE — Progress Notes (Signed)
HPI:  Past medical history of hypertension, hyperlipidemia, hyperglycemia, obesity, anxiety and depression, allergies and osteoarthritis here for follow-up.  She has a history of very poor compliance.  Currently is on disability for back injury degenerative disc disease.  She reports she has no insurance currently, but will have insurance within the next few weeks.  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  She has not been seen in follow-up for some time.  Reports she needs refills on her lisinopril, Effexor and her Detrol.  Denies any chest pain, shortness of breath, suicidal ideation, depression or other concerns today. Due for labs, colon cancer screening, flu shot.  Reports she will consider the Cologuard versus a colonoscopy and will call us when she has insurance.  She anticipates having insurance in the next week or so.  ROS: See pertinent positives and negatives per HPI.  Past Medical History:  Diagnosis Date  . Allergy   . Anxiety   . Anxiety and depression   . Arthritis   . Depression   . HTN (hypertension)   . Hx of iron deficiency anemia    reports [place] trait; reports on iron her whole life  . Low back pain    chronic, reports hx DDD treated by chiropractor  . No blood products    Jehovah's Witness  . OAB (overactive bladder)   . Seasonal allergies   . Sickle cell trait ([place])   . Uterine fibroid     Past Surgical History:  Procedure Laterality Date  . BUNIONECTOMY Bilateral 1987  . CESAREAN SECTION    . Teeth extracted   01/14/2017  . TUBAL LIGATION    . TUMOR REMOVAL     behind left eye    Family History  Problem Relation Age of Onset  . Diabetes Father   . Hypertension Father   . Dementia Father        senile  . Other Mother        smoker  . Lung cancer Mother        2010  . Bipolar disorder Sister   . Schizophrenia Sister   . Schizophrenia Sister   . Bipolar disorder Sister   . Schizophrenia Sister    . Bipolar disorder Sister   . Bipolar disorder Daughter   . Colon cancer Neg Hx     Social History   Socioeconomic History  . Marital status: Single    Spouse name: None  . Number of children: None  . Years of education: None  . Highest education level: None  Social Needs  . Financial resource strain: None  . Food insecurity - worry: None  . Food insecurity - inability: None  . Transportation needs - medical: None  . Transportation needs - non-medical: None  Occupational History  . None  Tobacco Use  . Smoking status: Never Smoker  . Smokeless tobacco: Never Used  Substance and Sexual Activity  . Alcohol use: Yes    Comment: socially - rare; 2 drinks a few times per month  . Drug use: No  . Sexual activity: None  Other Topics Concern  . None  Social History Narrative   Work or School: CNA, working on Paediatric nurse Situation: Lives with husband - married in 2015; hx of homelessness in 2015      Spiritual Beliefs: Jehovah's Witness      Lifestyle: no regular CV exercise; diet  improving - reports she has been able to loose weight                    Current Outpatient Medications:  .  calcium-vitamin D (CALCIUM + D) 250-125 MG-UNIT per tablet, Take 1 tablet by mouth daily., Disp: 30 tablet, Rfl: 3 .  cyclobenzaprine (FLEXERIL) 5 MG tablet, Take 1 tablet (5 mg total) by mouth 2 (two) times daily as needed for muscle spasms., Disp: 20 tablet, Rfl: 0 .  Fe Fum-FA-B Cmp-C-Zn-Mg-Mn-Cu (HEMOCYTE PLUS) 106-1 MG CAPS, Take 1 capsule by mouth daily., Disp: 30 each, Rfl: 3 .  fluticasone (FLONASE) 50 MCG/ACT nasal spray, Place 2 sprays into both nostrils daily. For 1 month, then 1 spray thereafter, Disp: 16 g, Rfl: 6 .  fluticasone (FLONASE) 50 MCG/ACT nasal spray, PLACE 1 SPRAY INTO BOTH NOSTRILS AT BEDTIME., Disp: 16 g, Rfl: 3 .  lisinopril (PRINIVIL,ZESTRIL) 20 MG tablet, Take 1 tablet (20 mg total) daily by mouth., Disp: 90 tablet, Rfl: 3 .  naproxen (NAPROSYN)  500 MG tablet, 1 tablet 1-2 times daily as needed for pain, Disp: 14 tablet, Rfl: 0 .  OVER THE COUNTER MEDICATION, Take 2 tablets by mouth daily. Biotin 5048mcg, Disp: , Rfl:  .  tolterodine (DETROL LA) 4 MG 24 hr capsule, Take 1 capsule (4 mg total) daily by mouth., Disp: 90 capsule, Rfl: 1 .  venlafaxine XR (EFFEXOR-XR) 75 MG 24 hr capsule, TAKE 3 CAPSULES BY MOUTH DAILY WITH BREAKFAST, Disp: 90 capsule, Rfl: 1  EXAM:  Vitals:   08/25/17 1324  BP: 130/90  Pulse: 85  Temp: 98 F (36.7 C)    Body mass index is 33.16 kg/m.  GENERAL: vitals reviewed and listed above, alert, oriented, appears well hydrated and in no acute distress  HEENT: atraumatic, conjunttiva clear, no obvious abnormalities on inspection of external nose and ears  NECK: no obvious masses on inspection  LUNGS: clear to auscultation bilaterally, no wheezes, rales or rhonchi, good air movement  CV: HRRR, no peripheral edema  MS: moves all extremities without noticeable abnormality  PSYCH: pleasant and cooperative, no obvious depression or anxiety  ASSESSMENT AND PLAN:  Discussed the following assessment and plan:  Dyslipidemia  HYPERTENSION, BENIGN - Plan: Basic metabolic panel, CBC  DEPRESSION/ANXIETY  Obesity due to excess calories, unspecified classification, unspecified whether serious comorbidity present  OAB (overactive bladder)  Hyperglycemia - Plan: Hemoglobin A1c  -Increase lisinopril to 20 mg -Refill medications -Labs today -Flu shot -Advised healthy diet and regular exercise for weight reduction in good health, follow-up in 3 months -Flu shot today -Patient advised to return or notify a doctor immediately if symptoms worsen or persist or new concerns arise.  Patient Instructions  BEFORE YOU LEAVE: -Cologuard information, please recommend that she call us if she wishes for [place] to order this for a colonoscopy -Flu shot -Labs -PHQ 9 -follow up: Physical exam in 3 months months  And  for recheck on the blood pressure   Increase the lisinopril to 20 mg daily.  We have ordered labs or studies at this visit. It can take up to 1-2 weeks for results and processing. IF results require follow up or explanation, we will call you with instructions. Clinically stable results will be released to your [facility]. If you have not heard from [place] or cannot find your results in [facility] in 2 weeks please contact our office at [phone].  If you are not yet signed up for [facility], please consider signing up.  We recommend the following healthy lifestyle for LIFE: 1) Small portions. But, make sure to get regular (at least 3 per day), healthy meals and small healthy snacks if needed.  2) Eat a healthy clean diet.   TRY TO EAT: -at least 5-7 servings of low sugar, colorful, and nutrient rich vegetables per day (not corn, potatoes or bananas.) -berries are the best choice if you wish to eat fruit (only eat small amounts if trying to reduce weight)  -lean meets (fish, white meat of chicken or [place]) -vegan proteins for some meals - beans or tofu, whole grains, nuts and seeds -Replace bad fats with good fats - good fats include: fish, nuts and seeds, canola oil, olive oil -small amounts of low fat or non fat dairy -small amounts of100 % whole grains - check the lables  AVOID: -SUGAR, sweets, anything with added sugar, corn syrup or sweeteners -if you must have a sweetener, small amounts of stevia may be best -sweetened beverages -simple starches (rice, bread, potatoes, pasta, chips, etc - small amounts of 100% whole grains are ok) -red meat, pork, butter -fried foods, fast food, processed food, excessive dairy, eggs and coconut.  3)Get at least 150 minutes of sweaty aerobic exercise per week.  4)Reduce stress - consider counseling, meditation and relaxation to balance other aspects of your life.         Colin Benton R., DO

## 2017-08-26 MED FILL — VENLAFAXINE HCL ER 75 MG CA: 75 | 30 days supply | Qty: 90 | Fill #0

## 2017-09-09 DIAGNOSIS — M7061 Trochanteric bursitis, right hip: Secondary | ICD-10-CM | POA: Diagnosis not present

## 2017-09-28 MED FILL — VENLAFAXINE HCL ER 75 MG CA: 75 | 15 days supply | Qty: 45 | Fill #1

## 2017-10-14 MED FILL — VENLAFAXINE HCL ER 75 MG CA: 75 | 15 days supply | Qty: 45 | Fill #2

## 2017-10-14 MED FILL — TOLTERODINE TART ER 4 MG CA: 4 | 30 days supply | Qty: 30 | Fill #0

## 2017-10-20 ENCOUNTER — Encounter: Payer: Self-pay | Admitting: Family Medicine

## 2017-10-24 ENCOUNTER — Ambulatory Visit: Payer: Self-pay | Admitting: Family Medicine

## 2017-10-24 DIAGNOSIS — Z0289 Encounter for other administrative examinations: Secondary | ICD-10-CM

## 2017-10-27 ENCOUNTER — Ambulatory Visit (INDEPENDENT_AMBULATORY_CARE_PROVIDER_SITE_OTHER): Payer: BLUE CROSS/BLUE SHIELD | Admitting: Family Medicine

## 2017-10-27 ENCOUNTER — Encounter: Payer: Self-pay | Admitting: Family Medicine

## 2017-10-27 VITALS — BP 158/88 | HR 86 | Temp 98.2°F | Wt 211.0 lb

## 2017-10-27 DIAGNOSIS — R739 Hyperglycemia, unspecified: Secondary | ICD-10-CM | POA: Diagnosis not present

## 2017-10-27 DIAGNOSIS — Z6833 Body mass index (BMI) 33.0-33.9, adult: Secondary | ICD-10-CM | POA: Diagnosis not present

## 2017-10-27 DIAGNOSIS — F339 Major depressive disorder, recurrent, unspecified: Secondary | ICD-10-CM

## 2017-10-27 DIAGNOSIS — E785 Hyperlipidemia, unspecified: Secondary | ICD-10-CM | POA: Diagnosis not present

## 2017-10-27 DIAGNOSIS — I10 Essential (primary) hypertension: Secondary | ICD-10-CM

## 2017-10-27 MED ORDER — AMLODIPINE BESYLATE 5 MG PO TABS: 5.0000 mg | ORAL_TABLET | Freq: Every day | ORAL | 3 refills | Status: DC

## 2017-10-27 MED FILL — AMLODIPINE BESYLATE 5 MG TA: 5 | 90 days supply | Qty: 90 | Fill #0

## 2017-10-27 NOTE — Progress Notes (Signed)
HPI:   Rachel Fields is a pleasant 62 y.o. here for follow up. Chronic medical problems summarized below were reviewed for changes.  History of poor compliance in the past.  Doing okay.  She has to have an exam and she had to postpone it because her blood pressure was elevated FEC.  She also has a new concern of right knee pain that started about a week ago.  She thinks it was a little tweak and may be swollen at the time.   No giving away, weakness or catching.  Plans to follow-up with her orthopedic doctor if not improving.  Denies CP, SOB, DOE, treatment intolerance or new symptoms. Due for colon cancer screening and labs  Hypertension/prediabetes/obesity: -Medications include lisinopril  Depression and anxiety, recurrent: -Medications include Effexor  Overactive bladder: -Medications include Detrol  Allergies, seasonal: -Takes antihistamine and Flonase  Chronic back pain: -sees specialist for management  ROS: See pertinent positives and negatives per HPI.  Past Medical History:  Diagnosis Date  . Allergy   . Anxiety   . Anxiety and depression   . Arthritis   . Depression   . HTN (hypertension)   . Hx of iron deficiency anemia    reports [place] trait; reports on iron her whole life  . Low back pain    chronic, reports hx DDD treated by chiropractor  . No blood products    Jehovah's Witness  . OAB (overactive bladder)   . Seasonal allergies   . Sickle cell trait ([place])   . Uterine fibroid     Past Surgical History:  Procedure Laterality Date  . BUNIONECTOMY Bilateral 1987  . CESAREAN SECTION    . Teeth extracted   01/14/2017  . TUBAL LIGATION    . TUMOR REMOVAL     behind left eye    Family History  Problem Relation Age of Onset  . Diabetes Father   . Hypertension Father   . Dementia Father        senile  . Other Mother        smoker  . Lung cancer Mother        2010  . Bipolar disorder Sister   . Schizophrenia Sister   . Schizophrenia Sister   . Bipolar  disorder Sister   . Schizophrenia Sister   . Bipolar disorder Sister   . Bipolar disorder Daughter   . Colon cancer Neg Hx     Social History   Socioeconomic History  . Marital status: Single    Spouse name: None  . Number of children: None  . Years of education: None  . Highest education level: None  Social Needs  . Financial resource strain: None  . Food insecurity - worry: None  . Food insecurity - inability: None  . Transportation needs - medical: None  . Transportation needs - non-medical: None  Occupational History  . None  Tobacco Use  . Smoking status: Never Smoker  . Smokeless tobacco: Never Used  Substance and Sexual Activity  . Alcohol use: Yes    Comment: socially - rare; 2 drinks a few times per month  . Drug use: No  . Sexual activity: None  Other Topics Concern  . None  Social History Narrative   Work or School: CNA, working on Pensions consultant      Home Situation: Lives with husband - married in 2015; hx of homelessness in 2015      Spiritual Beliefs: [place]  Lifestyle: no regular CV exercise; diet improving - reports she has been able to loose weight                    Current Outpatient Medications:  .  calcium-vitamin D (CALCIUM + D) 250-125 MG-UNIT per tablet, Take 1 tablet by mouth daily., Disp: 30 tablet, Rfl: 3 .  cyclobenzaprine (FLEXERIL) 5 MG tablet, Take 1 tablet (5 mg total) by mouth 2 (two) times daily as needed for muscle spasms., Disp: 20 tablet, Rfl: 0 .  Fe Fum-FA-B Cmp-C-Zn-Mg-Mn-Cu (HEMOCYTE PLUS) 106-1 MG CAPS, Take 1 capsule by mouth daily., Disp: 30 each, Rfl: 3 .  fluticasone (FLONASE) 50 MCG/ACT nasal spray, Place 2 sprays into both nostrils daily. For 1 month, then 1 spray thereafter, Disp: 16 g, Rfl: 6 .  fluticasone (FLONASE) 50 MCG/ACT nasal spray, PLACE 1 SPRAY INTO BOTH NOSTRILS AT BEDTIME., Disp: 16 g, Rfl: 3 .  lisinopril (PRINIVIL,ZESTRIL) 20 MG tablet, Take 1 tablet (20 mg total) daily by mouth., Disp: 90  tablet, Rfl: 3 .  naproxen (NAPROSYN) 500 MG tablet, 1 tablet 1-2 times daily as needed for pain, Disp: 14 tablet, Rfl: 0 .  OVER THE COUNTER MEDICATION, Take 2 tablets by mouth daily. Biotin 502mcg, Disp: , Rfl:  .  tolterodine (DETROL LA) 4 MG 24 hr capsule, Take 1 capsule (4 mg total) daily by mouth., Disp: 90 capsule, Rfl: 1 .  venlafaxine XR (EFFEXOR-XR) 75 MG 24 hr capsule, TAKE 3 CAPSULES BY MOUTH DAILY WITH BREAKFAST, Disp: 90 capsule, Rfl: 1 .  amLODipine (NORVASC) 5 MG tablet, Take 1 tablet (5 mg total) by mouth daily., Disp: 90 tablet, Rfl: 3  EXAM:  Vitals:   10/27/17 1338  BP: (!) 158/88  Pulse: 86  Temp: 98.2 F (36.8 C)  SpO2: 95%    Body mass index is 33.05 kg/m.  GENERAL: vitals reviewed and listed above, alert, oriented, appears well hydrated and in no acute distress  HEENT: atraumatic, conjunttiva clear, no obvious abnormalities on inspection of external nose and ears  NECK: no obvious masses on inspection  LUNGS: clear to auscultation bilaterally, no wheezes, rales or rhonchi, good air movement  CV: HRRR, no peripheral edema  MS: moves all extremities without noticeable abnormality, no effusion on inspection of the right knee, no significant tenderness to palpation except for minimal tenderness along the medial joint line, Lachman, McMurray, val/var stress testing, drawer testing all okay.  Neurovascular intact distal.  PSYCH: pleasant and cooperative, no obvious depression or anxiety  ASSESSMENT AND PLAN:  Discussed the following assessment and plan:  Essential hypertension - Plan: Basic metabolic panel, CBC  Hyperglycemia - Plan: Hemoglobin A1c  Hyperlipidemia, unspecified hyperlipidemia type - Plan: HDL cholesterol, Cholesterol, total  BMI 33.0-33.9,adult  Depression, recurrent (HCC)  -Discussed options for blood pressure and after discussing risk and benefits she decided to try Norvasc and continue her lisinopril, new prescription  sent -Follow-up in 1 week to recheck blood pressure, she wants to get it under control so she can take the exam -PHQ9 screening -Review again options for colon cancer screening in order per patient preference -Labs today advised -Plans to see orthopedic if knee pain persists -suspect osteoarthritis -Lifestyle recommendations advised for weight reduction -Patient advised to return or notify a doctor immediately if symptoms worsen or persist or new concerns arise.  Patient Instructions  BEFORE YOU LEAVE: -PHQ9 in epic -? cologuard or colonosocpy for colon cancer screening? -labs -follow up: 1 week w/ Dr. Maudie Mercury  for HTN  Start the Norvasc today and take every day for blood pressure along with the lisinopril.  Eat a very healthy low sugar diet and get regular exercise - walking, water aerobics, etc if other activities not tolerated. Even chair exercises if needed.  See your orthopedic doctor as planned about the knee issues if they persist.  We have ordered labs or studies at this visit. It can take up to 1-2 weeks for results and processing. IF results require follow up or explanation, we will call you with instructions. Clinically stable results will be released to your [facility]. If you have not heard from [place] or cannot find your results in [facility] in 2 weeks please contact our office at [phone].  If you are not yet signed up for [facility], please consider signing up.   We recommend the following healthy lifestyle for LIFE: 1) Small portions. But, make sure to get regular (at least 3 per day), healthy meals and small healthy snacks if needed.  2) Eat a healthy clean diet.   TRY TO EAT: -at least 5-7 servings of low sugar, colorful, and nutrient rich vegetables per day (not corn, potatoes or bananas.) -berries are the best choice if you wish to eat fruit (only eat small amounts if trying to reduce weight)  -lean meets (fish, white meat of chicken or [place]) -vegan proteins for some  meals - beans or tofu, whole grains, nuts and seeds -Replace bad fats with good fats - good fats include: fish, nuts and seeds, canola oil, olive oil -small amounts of low fat or non fat dairy -small amounts of100 % whole grains - check the lables -drink plenty of water  AVOID: -SUGAR, sweets, anything with added sugar, corn syrup or sweeteners - must read labels as even foods advertised as "healthy" often are loaded with sugar -if you must have a sweetener, small amounts of stevia may be best -sweetened beverages and artificially sweetened beverages -simple starches (rice, bread, potatoes, pasta, chips, etc - small amounts of 100% whole grains are ok) -red meat, pork, butter -fried foods, fast food, processed food, excessive dairy, eggs and coconut.  3)Get at least 150 minutes of sweaty aerobic exercise per week.  4)Reduce stress - consider counseling, meditation and relaxation to balance other aspects of your life.          Colin Benton R., DO

## 2017-10-27 NOTE — Patient Instructions (Addendum)
BEFORE YOU LEAVE: -PHQ9 in epic -? cologuard or colonosocpy for colon cancer screening? -labs -follow up: 1 week w/ Dr. Maudie Mercury for HTN  Start the Norvasc today and take every day for blood pressure along with the lisinopril.  Eat a very healthy low sugar diet and get regular exercise - walking, water aerobics, etc if other activities not tolerated. Even chair exercises if needed.  See your orthopedic doctor as planned about the knee issues if they persist.  We have ordered labs or studies at this visit. It can take up to 1-2 weeks for results and processing. IF results require follow up or explanation, we will call you with instructions. Clinically stable results will be released to your [facility]. If you have not heard from [place] or cannot find your results in [facility] in 2 weeks please contact our office at [phone].  If you are not yet signed up for [facility], please consider signing up.   We recommend the following healthy lifestyle for LIFE: 1) Small portions. But, make sure to get regular (at least 3 per day), healthy meals and small healthy snacks if needed.  2) Eat a healthy clean diet.   TRY TO EAT: -at least 5-7 servings of low sugar, colorful, and nutrient rich vegetables per day (not corn, potatoes or bananas.) -berries are the best choice if you wish to eat fruit (only eat small amounts if trying to reduce weight)  -lean meets (fish, white meat of chicken or [place]) -vegan proteins for some meals - beans or tofu, whole grains, nuts and seeds -Replace bad fats with good fats - good fats include: fish, nuts and seeds, canola oil, olive oil -small amounts of low fat or non fat dairy -small amounts of100 % whole grains - check the lables -drink plenty of water  AVOID: -SUGAR, sweets, anything with added sugar, corn syrup or sweeteners - must read labels as even foods advertised as "healthy" often are loaded with sugar -if you must have a sweetener, small amounts of stevia may be  best -sweetened beverages and artificially sweetened beverages -simple starches (rice, bread, potatoes, pasta, chips, etc - small amounts of 100% whole grains are ok) -red meat, pork, butter -fried foods, fast food, processed food, excessive dairy, eggs and coconut.  3)Get at least 150 minutes of sweaty aerobic exercise per week.  4)Reduce stress - consider counseling, meditation and relaxation to balance other aspects of your life.

## 2017-10-31 MED FILL — VENLAFAXINE HCL ER 75 MG CA: 75 | 20 days supply | Qty: 60 | Fill #2

## 2017-10-31 NOTE — Progress Notes (Deleted)
HPI:  Follow-up hypertension: -History of poor compliance in the past -Medications include lisinopril, added Norvasc at her last visit -Reports -Denies We ordered all her labs at her last visit, but again she left without doing these, this is the second time she has done this.  Due for colon cancer screening  ROS: See pertinent positives and negatives per HPI.  Past Medical History:  Diagnosis Date  . Allergy   . Anxiety   . Anxiety and depression   . Arthritis   . Depression   . HTN (hypertension)   . Hx of iron deficiency anemia    reports [place] trait; reports on iron her whole life  . Low back pain    chronic, reports hx DDD treated by chiropractor  . No blood products    Jehovah's Witness  . OAB (overactive bladder)   . Seasonal allergies   . Sickle cell trait ([place])   . Uterine fibroid     Past Surgical History:  Procedure Laterality Date  . BUNIONECTOMY Bilateral 1987  . CESAREAN SECTION    . Teeth extracted   01/14/2017  . TUBAL LIGATION    . TUMOR REMOVAL     behind left eye    Family History  Problem Relation Age of Onset  . Diabetes Father   . Hypertension Father   . Dementia Father        senile  . Other Mother        smoker  . Lung cancer Mother        2010  . Bipolar disorder Sister   . Schizophrenia Sister   . Schizophrenia Sister   . Bipolar disorder Sister   . Schizophrenia Sister   . Bipolar disorder Sister   . Bipolar disorder Daughter   . Colon cancer Neg Hx     Social History   Socioeconomic History  . Marital status: Single    Spouse name: Not on file  . Number of children: Not on file  . Years of education: Not on file  . Highest education level: Not on file  Social Needs  . Financial resource strain: Not on file  . Food insecurity - worry: Not on file  . Food insecurity - inability: Not on file  . Transportation needs - medical: Not on file  . Transportation needs - non-medical: Not on file  Occupational History  . Not  on file  Tobacco Use  . Smoking status: Never Smoker  . Smokeless tobacco: Never Used  Substance and Sexual Activity  . Alcohol use: Yes    Comment: socially - rare; 2 drinks a few times per month  . Drug use: No  . Sexual activity: Not on file  Other Topics Concern  . Not on file  Social History Narrative   Work or School: CNA, working on Pensions consultant      Home Situation: Lives with husband - married in 2015; hx of homelessness in 2015      Spiritual Beliefs: Jehovah's Witness      Lifestyle: no regular CV exercise; diet improving - reports she has been able to loose weight                    Current Outpatient Medications:  .  amLODipine (NORVASC) 5 MG tablet, Take 1 tablet (5 mg total) by mouth daily., Disp: 90 tablet, Rfl: 3 .  calcium-vitamin D (CALCIUM + D) 250-125 MG-UNIT per tablet, Take 1 tablet by mouth daily., Disp: 30  tablet, Rfl: 3 .  cyclobenzaprine (FLEXERIL) 5 MG tablet, Take 1 tablet (5 mg total) by mouth 2 (two) times daily as needed for muscle spasms., Disp: 20 tablet, Rfl: 0 .  Fe Fum-FA-B Cmp-C-Zn-Mg-Mn-Cu (HEMOCYTE PLUS) 106-1 MG CAPS, Take 1 capsule by mouth daily., Disp: 30 each, Rfl: 3 .  fluticasone (FLONASE) 50 MCG/ACT nasal spray, Place 2 sprays into both nostrils daily. For 1 month, then 1 spray thereafter, Disp: 16 g, Rfl: 6 .  fluticasone (FLONASE) 50 MCG/ACT nasal spray, PLACE 1 SPRAY INTO BOTH NOSTRILS AT BEDTIME., Disp: 16 g, Rfl: 3 .  lisinopril (PRINIVIL,ZESTRIL) 20 MG tablet, Take 1 tablet (20 mg total) daily by mouth., Disp: 90 tablet, Rfl: 3 .  naproxen (NAPROSYN) 500 MG tablet, 1 tablet 1-2 times daily as needed for pain, Disp: 14 tablet, Rfl: 0 .  OVER THE COUNTER MEDICATION, Take 2 tablets by mouth daily. Biotin 5047mcg, Disp: , Rfl:  .  tolterodine (DETROL LA) 4 MG 24 hr capsule, Take 1 capsule (4 mg total) daily by mouth., Disp: 90 capsule, Rfl: 1 .  venlafaxine XR (EFFEXOR-XR) 75 MG 24 hr capsule, TAKE 3 CAPSULES BY MOUTH DAILY WITH  BREAKFAST, Disp: 90 capsule, Rfl: 1  EXAM:  There were no vitals filed for this visit.  There is no height or weight on file to calculate BMI.  GENERAL: vitals reviewed and listed above, alert, oriented, appears well hydrated and in no acute distress  HEENT: atraumatic, conjunttiva clear, no obvious abnormalities on inspection of external nose and ears  NECK: no obvious masses on inspection  LUNGS: clear to auscultation bilaterally, no wheezes, rales or rhonchi, good air movement  CV: HRRR, no peripheral edema  MS: moves all extremities without noticeable abnormality *** PSYCH: pleasant and cooperative, no obvious depression or anxiety  ASSESSMENT AND PLAN:  Discussed the following assessment and plan:  No diagnosis found.  *** -Patient advised to return or notify a doctor immediately if symptoms worsen or persist or new concerns arise.  There are no Patient Instructions on file for this visit.  Lucretia Kern, DO

## 2017-11-01 ENCOUNTER — Ambulatory Visit: Payer: BLUE CROSS/BLUE SHIELD | Admitting: Family Medicine

## 2017-11-01 DIAGNOSIS — Z0289 Encounter for other administrative examinations: Secondary | ICD-10-CM

## 2017-11-15 ENCOUNTER — Encounter: Payer: Self-pay | Admitting: Family Medicine

## 2017-11-15 ENCOUNTER — Ambulatory Visit: Payer: BLUE CROSS/BLUE SHIELD | Admitting: Family Medicine

## 2017-11-15 VITALS — BP 160/100 | HR 97 | Temp 97.5°F | Ht 67.0 in | Wt 213.0 lb

## 2017-11-15 DIAGNOSIS — I10 Essential (primary) hypertension: Secondary | ICD-10-CM

## 2017-11-15 DIAGNOSIS — Z1211 Encounter for screening for malignant neoplasm of colon: Secondary | ICD-10-CM | POA: Diagnosis not present

## 2017-11-15 DIAGNOSIS — F341 Dysthymic disorder: Secondary | ICD-10-CM | POA: Diagnosis not present

## 2017-11-15 MED ORDER — VENLAFAXINE HCL ER 75 MG PO CP24: ORAL_CAPSULE | ORAL | 3 refills | Status: DC

## 2017-11-15 MED ORDER — LISINOPRIL 20 MG PO TABS: 20.0000 mg | ORAL_TABLET | Freq: Every day | ORAL | 3 refills | Status: DC

## 2017-11-15 MED FILL — VENLAFAXINE HCL ER 75 MG CA: 75 | 30 days supply | Qty: 90 | Fill #0

## 2017-11-15 NOTE — Progress Notes (Signed)
HPI:  Follow up HTN: -meds lisinopril and added norvasc 10/2017 -reports: she thought she was supposed to start norvasc and stop lisinopril -denies:CP, swelling, SOB, DOE Ordered labs las visit - she did not do them, but has physical coming up and plans to do then. Also due for colon ca screening - she wants to do colonosocpy. Requesting refill of her effexor. Mood stable. Denies depression or anxiety symptoms.  ROS: See pertinent positives and negatives per HPI.  Past Medical History:  Diagnosis Date  . Allergy   . Anxiety   . Anxiety and depression   . Arthritis   . Depression   . HTN (hypertension)   . Hx of iron deficiency anemia    reports [place] trait; reports on iron her whole life  . Low back pain    chronic, reports hx DDD treated by chiropractor  . No blood products    Jehovah's Witness  . OAB (overactive bladder)   . Seasonal allergies   . Sickle cell trait ([place])   . Uterine fibroid     Past Surgical History:  Procedure Laterality Date  . BUNIONECTOMY Bilateral 1987  . CESAREAN SECTION    . Teeth extracted   01/14/2017  . TUBAL LIGATION    . TUMOR REMOVAL     behind left eye    Family History  Problem Relation Age of Onset  . Diabetes Father   . Hypertension Father   . Dementia Father        senile  . Other Mother        smoker  . Lung cancer Mother        2010  . Bipolar disorder Sister   . Schizophrenia Sister   . Schizophrenia Sister   . Bipolar disorder Sister   . Schizophrenia Sister   . Bipolar disorder Sister   . Bipolar disorder Daughter   . Colon cancer Neg Hx     Social History   Socioeconomic History  . Marital status: Single    Spouse name: None  . Number of children: None  . Years of education: None  . Highest education level: None  Social Needs  . Financial resource strain: None  . Food insecurity - worry: None  . Food insecurity - inability: None  . Transportation needs - medical: None  . Transportation needs -  non-medical: None  Occupational History  . None  Tobacco Use  . Smoking status: Never Smoker  . Smokeless tobacco: Never Used  Substance and Sexual Activity  . Alcohol use: Yes    Comment: socially - rare; 2 drinks a few times per month  . Drug use: No  . Sexual activity: None  Other Topics Concern  . None  Social History Narrative   Work or School: Quarry manager, working on Paediatric nurse Situation: Lives with husband - married in 2015; hx of homelessness in 2015      Spiritual Beliefs: Jehovah's Witness      Lifestyle: no regular CV exercise; diet improving - reports she has been able to loose weight                    Current Outpatient Medications:  .  amLODipine (NORVASC) 5 MG tablet, Take 1 tablet (5 mg total) by mouth daily., Disp: 90 tablet, Rfl: 3 .  calcium-vitamin D (CALCIUM + D) 250-125 MG-UNIT per tablet, Take 1 tablet by mouth daily., Disp: 30 tablet, Rfl: 3 .  cyclobenzaprine (  FLEXERIL) 5 MG tablet, Take 1 tablet (5 mg total) by mouth 2 (two) times daily as needed for muscle spasms., Disp: 20 tablet, Rfl: 0 .  Fe Fum-FA-B Cmp-C-Zn-Mg-Mn-Cu (HEMOCYTE PLUS) 106-1 MG CAPS, Take 1 capsule by mouth daily., Disp: 30 each, Rfl: 3 .  fluticasone (FLONASE) 50 MCG/ACT nasal spray, PLACE 1 SPRAY INTO BOTH NOSTRILS AT BEDTIME., Disp: 16 g, Rfl: 3 .  lisinopril (PRINIVIL,ZESTRIL) 20 MG tablet, Take 1 tablet (20 mg total) by mouth daily., Disp: 90 tablet, Rfl: 3 .  naproxen (NAPROSYN) 500 MG tablet, 1 tablet 1-2 times daily as needed for pain, Disp: 14 tablet, Rfl: 0 .  OVER THE COUNTER MEDICATION, Take 2 tablets by mouth daily. Biotin 5024mcg, Disp: , Rfl:  .  tolterodine (DETROL LA) 4 MG 24 hr capsule, Take 1 capsule (4 mg total) daily by mouth., Disp: 90 capsule, Rfl: 1 .  venlafaxine XR (EFFEXOR-XR) 75 MG 24 hr capsule, TAKE 3 CAPSULES BY MOUTH DAILY WITH BREAKFAST, Disp: 90 capsule, Rfl: 3  EXAM:  Vitals:   11/15/17 1315 11/15/17 1317  BP: (!) 140/100 (!) 160/100   Pulse: 97   Temp: (!) 97.5 F (36.4 C)     Body mass index is 33.36 kg/m.  GENERAL: vitals reviewed and listed above, alert, oriented, appears well hydrated and in no acute distress  HEENT: atraumatic, conjunttiva clear, no obvious abnormalities on inspection of external nose and ears  NECK: no obvious masses on inspection  LUNGS: clear to auscultation bilaterally, no wheezes, rales or rhonchi, good air movement  CV: HRRR, no peripheral edema  MS: moves all extremities without noticeable abnormality  PSYCH: pleasant and cooperative, no obvious depression or anxiety  ASSESSMENT AND PLAN:  Discussed the following assessment and plan:  HYPERTENSION, BENIGN -BP improved on recheck but not at goal -advised taking the lisinopril AND the norvasc and call in 4-5 days -follow up at physical in a few weeks, labs then  DEPRESSION/ANXIETY -meds refilled -mood good per pt report  Colon cancer screening -advised assistant to place referral  -Patient advised to return or notify a doctor immediately if symptoms worsen or persist or new concerns arise.  Patient Instructions  -follow up in 1-2 weeks -take Lisinopril and Norvasc every day -check blood pressure at home and let us know if this is running high in 4-5 days (readings over 130/80)   Lucretia Kern, DO

## 2017-11-15 NOTE — Patient Instructions (Signed)
-  follow up in 1-2 weeks -take Lisinopril and Norvasc every day -check blood pressure at home and let us know if this is running high in 4-5 days (readings over 130/80)

## 2017-11-18 MED FILL — TOLTERODINE TART ER 4 MG CA: 4 | 30 days supply | Qty: 30 | Fill #0

## 2017-11-18 MED FILL — FLUTICASONE PROP 50 MCG SPR: 50 | 60 days supply | Qty: 16 | Fill #2

## 2017-11-18 MED FILL — LISINOPRIL 20 MG TABLET: 20 | 90 days supply | Qty: 90 | Fill #0

## 2017-11-24 ENCOUNTER — Emergency Department (HOSPITAL_COMMUNITY): Payer: BLUE CROSS/BLUE SHIELD

## 2017-11-24 ENCOUNTER — Encounter (HOSPITAL_COMMUNITY): Payer: Self-pay | Admitting: Emergency Medicine

## 2017-11-24 ENCOUNTER — Emergency Department (HOSPITAL_COMMUNITY)
Admission: EM | Admit: 2017-11-24 | Discharge: 2017-11-24 | Disposition: A | Discharge status: Home / Self Care | Payer: BLUE CROSS/BLUE SHIELD | Attending: Emergency Medicine | Admitting: Emergency Medicine

## 2017-11-24 DIAGNOSIS — Z79899 Other long term (current) drug therapy: Secondary | ICD-10-CM | POA: Diagnosis not present

## 2017-11-24 DIAGNOSIS — M25561 Pain in right knee: Secondary | ICD-10-CM | POA: Diagnosis not present

## 2017-11-24 DIAGNOSIS — I10 Essential (primary) hypertension: Secondary | ICD-10-CM | POA: Insufficient documentation

## 2017-11-24 DIAGNOSIS — D573 Sickle-cell trait: Secondary | ICD-10-CM | POA: Insufficient documentation

## 2017-11-24 DIAGNOSIS — M545 Low back pain: Secondary | ICD-10-CM | POA: Diagnosis not present

## 2017-11-24 DIAGNOSIS — G8929 Other chronic pain: Secondary | ICD-10-CM | POA: Diagnosis not present

## 2017-11-24 NOTE — ED Provider Notes (Signed)
[facility] DEPT Provider Note   CSN: [id number] Arrival date & time: 11/24/17  1357     History   Chief Complaint Chief Complaint  Patient presents with  . Knee Pain    HPI Rachel Fields is a 62 y.o. female with past medical history of chronic low back pain, hypertension, presenting to the ED with 1 week of right knee pain.  No known injury.  Reports pain is anterior, worse with movement and weightbearing, improved with elevation.  Has not taken any medication for pain.  Denies fever or joint swelling.  No previous injuries to right knee.  The history is provided by the patient.    Past Medical History:  Diagnosis Date  . Allergy   . Anxiety   . Anxiety and depression   . Arthritis   . Depression   . HTN (hypertension)   . Hx of iron deficiency anemia    reports [place] trait; reports on iron her whole life  . Low back pain    chronic, reports hx DDD treated by chiropractor  . No blood products    Jehovah's Witness  . OAB (overactive bladder)   . Seasonal allergies   . Sickle cell trait ([place])   . Uterine fibroid     Patient Active Problem List   Diagnosis Date Noted  . Seasonal allergies 01/21/2015  . OAB (overactive bladder) 01/21/2015  . Dyslipidemia 02/14/2014  . Obesity 12/19/2009  . LOW BACK PAIN 03/18/2008  . DEPRESSION/ANXIETY 06/15/2007  . HYPERTENSION, BENIGN 06/15/2007    Past Surgical History:  Procedure Laterality Date  . BUNIONECTOMY Bilateral 1987  . CESAREAN SECTION    . Teeth extracted   01/14/2017  . TUBAL LIGATION    . TUMOR REMOVAL     behind left eye    OB History    No data available       Home Medications    Prior to Admission medications   Medication Sig Start Date End Date Taking? Authorizing Provider  amLODipine (NORVASC) 5 MG tablet Take 1 tablet (5 mg total) by mouth daily. 10/27/17   Lucretia Kern, DO  calcium-vitamin D (CALCIUM + D) 250-125 MG-UNIT per tablet Take 1 tablet by mouth daily.  02/14/14   Robbie Lis, MD  cyclobenzaprine (FLEXERIL) 5 MG tablet Take 1 tablet (5 mg total) by mouth 2 (two) times daily as needed for muscle spasms. 01/04/17   Colin Benton R, DO  Fe Fum-FA-B Cmp-C-Zn-Mg-Mn-Cu (HEMOCYTE PLUS) 106-1 MG CAPS Take 1 capsule by mouth daily. 02/14/14   Robbie Lis, MD  fluticasone [facility]) 50 MCG/ACT nasal spray PLACE 1 SPRAY INTO BOTH NOSTRILS AT BEDTIME. 02/28/17   Lucretia Kern, DO  lisinopril (PRINIVIL,ZESTRIL) 20 MG tablet Take 1 tablet (20 mg total) by mouth daily. 11/15/17   Lucretia Kern, DO  naproxen (NAPROSYN) 500 MG tablet 1 tablet 1-2 times daily as needed for pain 01/04/17   Colin Benton R, DO  OVER THE COUNTER MEDICATION Take 2 tablets by mouth daily. Biotin 5011mcg    [provider]  tolterodine (DETROL LA) 4 MG 24 hr capsule Take 1 capsule (4 mg total) daily by mouth. 08/25/17   Lucretia Kern, DO  venlafaxine XR (EFFEXOR-XR) 75 MG 24 hr capsule TAKE 3 CAPSULES BY MOUTH DAILY WITH BREAKFAST 11/15/17   Lucretia Kern, DO    Family History Family History  Problem Relation Age of Onset  . Diabetes Father   . Hypertension Father   .  Dementia Father        senile  . Other Mother        smoker  . Lung cancer Mother        2010  . Bipolar disorder Sister   . Schizophrenia Sister   . Schizophrenia Sister   . Bipolar disorder Sister   . Schizophrenia Sister   . Bipolar disorder Sister   . Bipolar disorder Daughter   . Colon cancer Neg Hx     Social History Social History   Tobacco Use  . Smoking status: Never Smoker  . Smokeless tobacco: Never Used  Substance Use Topics  . Alcohol use: Yes    Comment: socially - rare; 2 drinks a few times per month  . Drug use: No     Allergies   Patient has no known allergies.   Review of Systems Review of Systems  Constitutional: Negative for fever.  Musculoskeletal: Positive for arthralgias. Negative for joint swelling.     Physical Exam Updated Vital Signs BP (!) 150/76 (BP  Location: Right Arm)   Pulse 96   Temp 97.9 F (36.6 C) (Oral)   Resp 18   SpO2 99%   Physical Exam  Constitutional: She appears well-developed and well-nourished. No distress.  HENT:  Head: Normocephalic and atraumatic.  Eyes: Conjunctivae are normal.  Cardiovascular: Normal rate and intact distal pulses.  Pulmonary/Chest: Effort normal.  Musculoskeletal:  Right knee with TTP over medial lateral joint line. No edema, erythema or warmth noted.  Normal active range of motion.  Negative valgus and varus, negative anterior posterior drawer.  Normal gait.  Psychiatric: She has a normal mood and affect. Her behavior is normal.  Nursing note and vitals reviewed.    ED Treatments / Results  Labs (all labs ordered are listed, but only abnormal results are displayed) Labs Reviewed - No data to display  EKG  EKG Interpretation None       Radiology Dg Knee Complete 4 Views Right  Result Date: 11/24/2017 CLINICAL DATA:  Knee pain, no known injury, initial encounter EXAM: RIGHT KNEE - COMPLETE 4+ VIEW COMPARISON:  None. FINDINGS: No evidence of fracture, dislocation, or joint effusion. No evidence of arthropathy or other focal bone abnormality. Soft tissues are unremarkable. IMPRESSION: No acute abnormality noted. Electronically Signed   By: Inez Catalina M.D.   On: 11/24/2017 18:45    Procedures Procedures (including critical care time)  Medications Ordered in ED Medications - No data to display   Initial Impression / Assessment and Plan / ED Course  I have reviewed the triage vital signs and the nursing notes.  Pertinent labs & imaging results that were available during my care of the patient were reviewed by me and considered in my medical decision making (see chart for details).     Patient with 1 week of right knee pain, no injury.  Knee is stable on exam, N/V intact.  X-Ray negative for obvious fracture or arthritis. Pt advised to follow up with PCP if symptoms persist.  Patient given ace wrap while in ED for compression, conservative therapy recommended and discussed. Patient will be dc home & is agreeable with above plan.  Discussed results, findings, treatment and follow up. Patient advised of return precautions. Patient verbalized understanding and agreed with plan.  Final Clinical Impressions(s) / ED Diagnoses   Final diagnoses:  Acute pain of right knee    ED Discharge Orders    None       Quentin Cornwall, [place]  N, PA-C 11/24/17 1906    Hayden Rasmussen, MD 11/26/17 1254

## 2017-11-24 NOTE — ED Triage Notes (Signed)
Patient reports right knee pain for 2 weeks that is worse with movement and weight bearing. Patient reports that she fell in August but no recent injuries to cause this pain. Patient been diagnosed with bursitis in right hip

## 2017-11-24 NOTE — Discharge Instructions (Signed)
Please read instructions below. Apply ice to your knee for 20 minutes at a time.  Elevate it when possible. You can take advil/ibuprofen every 6 hours as needed for pain. Schedule an appointment with primary care provider if symptoms persist. Return to the ER for new or concerning symptoms.

## 2017-11-26 NOTE — Progress Notes (Signed)
HPI:  Here for CPE: colon ca screening, mammo, fasting labs  -Concerns and/or follow up today:  Rachel Fields is a pleasant 62 y.o. here for follow up. Chronic medical problems summarized below were reviewed for changes and stability and were updated as needed below. These issues and their treatment remain stable for the most part.  She has some right knee pain that is a new problem.  She actually went to the emergency room for this recently.  She wants a referral to an orthopedic specialist.  This started acutely about 3 weeks ago per her report.  Pain is in the medial joint line region, and is sharp with certain movements.  Denies weakness, numbness, catching or locking.  She does feel occasionally like it is going to give away when she bears weight on that side.  Denies any redness or swelling.  Aleve seems to help with pain.  She feels like her mood is okay.  However, her PHQ 9 score is 13.  She reports she is okay unless she is around people.  She does want to consider counseling.  She does not wish to change her medication at this time.  She denies any thoughts of hurting others, hallucinations, suicidal ideation, thoughts of self-harm or severe symptoms.  She feels the Effexor is helped significantly.  Denies CP, SOB, DOE, treatment intolerance or new symptoms.  HTN: -meds: norvasc 5, lisinopril  Obesity/Hyperglycemia, Hyperlipidemia: -lifestyle changes advised  Depression and anxiety, recurrent: -meds: effexor  Overactive bladder: -Medications include Detrol  Allergies, seasonal: -Takes antihistamine and Flonase  Chronic back pain: -sees specialist for management  -Diet: variety of foods, balance and well rounded, larger portion sizes -is trying to do better -Exercise: Reports she has started exercising at [employer], is going twice per week -Taking folic acid, vitamin D or calcium: no -Diabetes and Dyslipidemia Screening: Fasting for labs -Vaccines: see vaccine section  EPIC -pap history: 02/2016 with Dr. Maudie Mercury, neg, hpv neg -FDLMP: see nursing notes -sexual activity: yes, female partner, no new partners -wants STI testing (Hep C if born [birth year]): no -FH breast, colon or ovarian ca: see FH Last mammogram: last in epic 04/2016 Last colon cancer screening:Reports she has never done colon cancer screening and wants to do a colonoscopy Breast Ca Risk Assessment: see family history and pt history DEXA (>/= 65): Not applicable  -Alcohol, Tobacco, drug use: see social history  Review of Systems - no fevers, unintentional weight loss, vision loss, hearing loss, chest pain, sob, hemoptysis, melena, hematochezia, hematuria, genital discharge, changing or concerning skin lesions, bleeding, bruising, loc, thoughts of self harm or SI  Past Medical History:  Diagnosis Date  . Allergy   . Anxiety   . Anxiety and depression   . Arthritis   . Depression   . HTN (hypertension)   . Hx of iron deficiency anemia    reports Durand trait; reports on iron her whole life  . Low back pain    chronic, reports hx DDD treated by chiropractor  . No blood products    Jehovah's Witness  . OAB (overactive bladder)   . Seasonal allergies   . Sickle cell trait ([place])   . Uterine fibroid     Past Surgical History:  Procedure Laterality Date  . BUNIONECTOMY Bilateral 1987  . CESAREAN SECTION    . Teeth extracted   01/14/2017  . TUBAL LIGATION    . TUMOR REMOVAL     behind left eye    Family History  Problem Relation Age of Onset  . Diabetes Father   . Hypertension Father   . Dementia Father        senile  . Other Mother        smoker  . Lung cancer Mother        2010  . Bipolar disorder Sister   . Schizophrenia Sister   . Schizophrenia Sister   . Bipolar disorder Sister   . Schizophrenia Sister   . Bipolar disorder Sister   . Bipolar disorder Daughter   . Colon cancer Neg Hx     Social History   Socioeconomic History  . Marital status: Single    Spouse name:  None  . Number of children: None  . Years of education: None  . Highest education level: None  Social Needs  . Financial resource strain: None  . Food insecurity - worry: None  . Food insecurity - inability: None  . Transportation needs - medical: None  . Transportation needs - non-medical: None  Occupational History  . None  Tobacco Use  . Smoking status: Never Smoker  . Smokeless tobacco: Never Used  Substance and Sexual Activity  . Alcohol use: Yes    Comment: socially - rare; 2 drinks a few times per month  . Drug use: No  . Sexual activity: None  Other Topics Concern  . None  Social History Narrative   Work or School: Quarry manager, working on Paediatric nurse Situation: Lives with husband - married in 2015; hx of homelessness in 2015      Spiritual Beliefs: Jehovah's Witness      Lifestyle: no regular CV exercise; diet improving - reports she has been able to loose weight                    Current Outpatient Medications:  .  amLODipine (NORVASC) 5 MG tablet, Take 1 tablet (5 mg total) by mouth daily., Disp: 90 tablet, Rfl: 3 .  calcium-vitamin D (CALCIUM + D) 250-125 MG-UNIT per tablet, Take 1 tablet by mouth daily., Disp: 30 tablet, Rfl: 3 .  cyclobenzaprine (FLEXERIL) 5 MG tablet, Take 1 tablet (5 mg total) by mouth 2 (two) times daily as needed for muscle spasms., Disp: 20 tablet, Rfl: 0 .  Fe Fum-FA-B Cmp-C-Zn-Mg-Mn-Cu (HEMOCYTE PLUS) 106-1 MG CAPS, Take 1 capsule by mouth daily., Disp: 30 each, Rfl: 3 .  fluticasone (FLONASE) 50 MCG/ACT nasal spray, PLACE 1 SPRAY INTO BOTH NOSTRILS AT BEDTIME., Disp: 16 g, Rfl: 3 .  lisinopril (PRINIVIL,ZESTRIL) 20 MG tablet, Take 1 tablet (20 mg total) by mouth daily., Disp: 90 tablet, Rfl: 3 .  naproxen (NAPROSYN) 500 MG tablet, 1 tablet 1-2 times daily as needed for pain, Disp: 14 tablet, Rfl: 0 .  OVER THE COUNTER MEDICATION, Take 2 tablets by mouth daily. Biotin 5058mg, Disp: , Rfl:  .  tolterodine (DETROL LA) 4 MG 24 hr  capsule, Take 1 capsule (4 mg total) daily by mouth., Disp: 90 capsule, Rfl: 1 .  venlafaxine XR (EFFEXOR-XR) 75 MG 24 hr capsule, TAKE 3 CAPSULES BY MOUTH DAILY WITH BREAKFAST, Disp: 90 capsule, Rfl: 3  EXAM:  Vitals:   11/28/17 0803  BP: 124/80  Pulse: 80  Temp: 98.1 F (36.7 C)  Body mass index is 32.61 kg/m.   GENERAL: vitals reviewed and listed below, alert, oriented, appears well hydrated and in no acute distress  HEENT: head atraumatic, PERRLA, normal appearance of eyes, ears, nose  and mouth. moist mucus membranes.  NECK: supple, no masses or lymphadenopathy  LUNGS: clear to auscultation bilaterally, no rales, rhonchi or wheeze  CV: HRRR, no peripheral edema or cyanosis, normal pedal pulses  ABDOMEN: bowel sounds normal, soft, non tender to palpation, no masses, no rebound or guarding  BREAST: normal appearance - no skin lesions or discharge noted on inspection of both breasts, on palpation of both breast and axillary region no suspicious lesions appreciated today  GU: Declined  SKIN: no rash or abnormal lesions  MS: normal gait, moves all extremities normally, no patellar crepitus, knee joints are stable, with normal Lockman, valgus varus stress testing and drawer testing, she does have some tenderness to palpation right along the medial joint line of the right knee without effusion, redness.  Questionable mildly positive McMurray's on the right.  Neurovascularly intact distally.  NEURO: normal gait, speech and thought processing grossly intact, muscle tone grossly intact throughout  PSYCH: normal affect, pleasant and cooperative  ASSESSMENT AND PLAN:  Discussed the following assessment and plan:  PREVENTIVE EXAM: -Discussed and advised all [place] preventive services health task force level A and B recommendations for age, sex and risks. -Advised at least 150 minutes of exercise per week and a healthy diet with avoidance of (less then 1 serving per week) processed  foods, white starches, red meat, fast foods and sweets and consisting of: * 5-9 servings of fresh fruits and vegetables (not corn or potatoes) *nuts and seeds, beans *olives and olive oil *lean meats such as fish and white chicken  *whole grains -labs, studies and vaccines per orders this encounter Strongly advised prompt Breast cancer screening - MM Digital Screening; Future - referral placed Strongly advised Colon cancer screening - she opted for colonca screening - Ambulatory referral to Gastroenterology placed  2. Acute pain of right knee -? MCL injury or meniscal injury vs other -no alarm features or instability on exam -referral to ortho -conservative tx and HE in interim - Ambulatory referral to Orthopedic Surgery  3. BMI 32.0-32.9,adult -Lifestyle recommendations, advised weight reduction, healthy diet and regular exercise  4. Hyperlipidemia, unspecified hyperlipidemia type - Lipid panel  5. Hyperglycemia - Hemoglobin A1c  6. HYPERTENSION, BENIGN -Blood pressure improved, continue current treatment along with lifestyle changes - Basic metabolic panel - CBC  7. Depression, recurrent ([place]) -Continue Effexor -Discussed other treatment options and she is interested in CBT, brochure provided to call and schedule -No severe symptoms, return/emergency precautions advised  Patient advised to return to clinic immediately if symptoms worsen or persist or new concerns.  Patient Instructions  BEFORE YOU LEAVE: -labs -knee exercises -follow up: 3 months  -We placed a referral for you as discussed for your colonoscopy, mammogram and to orthopedics per your request for the knee. It usually takes about 1-2 weeks to process and schedule this referral. If you have not heard from [place] regarding this appointment in 2 weeks please contact our office.  Ice, regular gentle activity, healthy diet and weight reduction can all help the knee while you are waiting to see the orthopedic  specialist.  Please use Tylenol or Aleve per instructions only as needed for pain.  Make sure you get your mammogram and colonoscopy as soon as available.  We have ordered labs or studies at this visit. It can take up to 1-2 weeks for results and processing. IF results require follow up or explanation, we will call you with instructions. Clinically stable results will be released to your [facility]. If you  have not heard from [place] or cannot find your results in [facility] in 2 weeks please contact our office at [phone].  If you are not yet signed up for [facility], please consider signing up.  Consider counseling, cognitive behavioral therapy.  Please call the number provided to schedule.   Preventive Care 40-64 Years, Female Preventive care refers to lifestyle choices and visits with your health care provider that can promote health and wellness. What does preventive care include?  A yearly physical exam. This is also called an annual well check.  Dental exams once or twice a year.  Routine eye exams. Ask your health care provider how often you should have your eyes checked.  Personal lifestyle choices, including: ? Daily care of your teeth and gums. ? Regular physical activity. ? Eating a healthy diet. ? Avoiding tobacco and drug use. ? Limiting alcohol use. ? Practicing safe sex. ? Taking low-dose aspirin daily starting at age 68. ? Taking vitamin and mineral supplements as recommended by your health care provider. What happens during an annual well check? The services and screenings done by your health care provider during your annual well check will depend on your age, overall health, lifestyle risk factors, and family history of disease. Counseling Your health care provider may ask you questions about your:  Alcohol use.  Tobacco use.  Drug use.  Emotional well-being.  Home and relationship well-being.  Sexual activity.  Eating habits.  Work and work  Statistician.  Method of birth control.  Menstrual cycle.  Pregnancy history.  Screening You may have the following tests or measurements:  Height, weight, and BMI.  Blood pressure.  Lipid and cholesterol levels. These may be checked every 5 years, or more frequently if you are over 30 years old.  Skin check.  Lung cancer screening. You may have this screening every year starting at age 56 if you have a 30-pack-year history of smoking and currently smoke or have quit within the past 15 years.  Fecal occult blood test (FOBT) of the stool. You may have this test every year starting at age 42.  Flexible sigmoidoscopy or colonoscopy. You may have a sigmoidoscopy every 5 years or a colonoscopy every 10 years starting at age 98.  Hepatitis C blood test.  Hepatitis B blood test.  Sexually transmitted disease (STD) testing.  Diabetes screening. This is done by checking your blood sugar (glucose) after you have not eaten for a while (fasting). You may have this done every 1-3 years.  Mammogram. This may be done every 1-2 years. Talk to your health care provider about when you should start having regular mammograms. This may depend on whether you have a family history of breast cancer.  BRCA-related cancer screening. This may be done if you have a family history of breast, ovarian, tubal, or peritoneal cancers.  Pelvic exam and Pap test. This may be done every 3 years starting at age 31. Starting at age 56, this may be done every 5 years if you have a Pap test in combination with an HPV test.  Bone density scan. This is done to screen for osteoporosis. You may have this scan if you are at high risk for osteoporosis.  Discuss your test results, treatment options, and if necessary, the need for more tests with your health care provider. Vaccines Your health care provider may recommend certain vaccines, such as:  Influenza vaccine. This is recommended every year.  Tetanus,  diphtheria, and acellular pertussis (Tdap, Td) vaccine. You  may need a Td booster every 10 years.  Varicella vaccine. You may need this if you have not been vaccinated.  Zoster vaccine. You may need this after age 61.  Measles, mumps, and rubella (MMR) vaccine. You may need at least one dose of MMR if you were born in 1957 or later. You may also need a second dose.  Pneumococcal 13-valent conjugate (PCV13) vaccine. You may need this if you have certain conditions and were not previously vaccinated.  Pneumococcal polysaccharide (PPSV23) vaccine. You may need one or two doses if you smoke cigarettes or if you have certain conditions.  Meningococcal vaccine. You may need this if you have certain conditions.  Hepatitis A vaccine. You may need this if you have certain conditions or if you travel or work in places where you may be exposed to hepatitis A.  Hepatitis B vaccine. You may need this if you have certain conditions or if you travel or work in places where you may be exposed to hepatitis B.  Haemophilus influenzae type b (Hib) vaccine. You may need this if you have certain conditions.  Talk to your health care provider about which screenings and vaccines you need and how often you need them. This information is not intended to replace advice given to you by your health care provider. Make sure you discuss any questions you have with your health care provider. Document Released: 10/24/2015 Document Revised: 06/16/2016 Document Reviewed: 07/29/2015 Elsevier Interactive Patient Education  2018 [employer].          No Follow-up on file.  Lucretia Kern, DO

## 2017-11-28 ENCOUNTER — Encounter: Payer: Self-pay | Admitting: Family Medicine

## 2017-11-28 ENCOUNTER — Ambulatory Visit (INDEPENDENT_AMBULATORY_CARE_PROVIDER_SITE_OTHER): Payer: BLUE CROSS/BLUE SHIELD | Admitting: Family Medicine

## 2017-11-28 VITALS — BP 124/80 | HR 80 | Temp 98.1°F | Ht 67.0 in | Wt 208.2 lb

## 2017-11-28 DIAGNOSIS — M25561 Pain in right knee: Secondary | ICD-10-CM

## 2017-11-28 DIAGNOSIS — Z1231 Encounter for screening mammogram for malignant neoplasm of breast: Secondary | ICD-10-CM

## 2017-11-28 DIAGNOSIS — R739 Hyperglycemia, unspecified: Secondary | ICD-10-CM | POA: Diagnosis not present

## 2017-11-28 DIAGNOSIS — Z6832 Body mass index (BMI) 32.0-32.9, adult: Secondary | ICD-10-CM

## 2017-11-28 DIAGNOSIS — Z1211 Encounter for screening for malignant neoplasm of colon: Secondary | ICD-10-CM

## 2017-11-28 DIAGNOSIS — Z1331 Encounter for screening for depression: Secondary | ICD-10-CM

## 2017-11-28 DIAGNOSIS — Z1239 Encounter for other screening for malignant neoplasm of breast: Secondary | ICD-10-CM

## 2017-11-28 DIAGNOSIS — Z0000 Encounter for general adult medical examination without abnormal findings: Secondary | ICD-10-CM | POA: Diagnosis not present

## 2017-11-28 DIAGNOSIS — I10 Essential (primary) hypertension: Secondary | ICD-10-CM | POA: Diagnosis not present

## 2017-11-28 DIAGNOSIS — E785 Hyperlipidemia, unspecified: Secondary | ICD-10-CM | POA: Diagnosis not present

## 2017-11-28 DIAGNOSIS — F339 Major depressive disorder, recurrent, unspecified: Secondary | ICD-10-CM

## 2017-11-28 LAB — CBC
HCT: 39.6 % (ref 36.0–46.0)
Hemoglobin: 13 g/dL (ref 12.0–15.0)
MCHC: 32.7 g/dL (ref 30.0–36.0)
MCV: 77.8 fl — ABNORMAL LOW (ref 78.0–100.0)
Platelets: 307 10*3/uL (ref 150.0–400.0)
RBC: 5.1 Mil/uL (ref 3.87–5.11)
RDW: 14.4 % (ref 11.5–15.5)
WBC: 6.6 10*3/uL (ref 4.0–10.5)

## 2017-11-28 LAB — LIPID PANEL
Cholesterol: 233 mg/dL — ABNORMAL HIGH (ref 0–200)
HDL: 56.2 mg/dL (ref >39.00)
LDL Cholesterol: 157 mg/dL — ABNORMAL HIGH (ref 0–99)
NonHDL: 176.71
Total CHOL/HDL Ratio: 4
Triglycerides: 100 mg/dL (ref 0.0–149.0)
VLDL: 20 mg/dL (ref 0.0–40.0)

## 2017-11-28 LAB — BASIC METABOLIC PANEL
BUN: 12 mg/dL (ref 6–23)
CO2: 30 mEq/L (ref 19–32)
Calcium: 9.3 mg/dL (ref 8.4–10.5)
Chloride: 103 mEq/L (ref 96–112)
Creatinine, Ser: 0.94 mg/dL (ref 0.40–1.20)
GFR: 77.71 mL/min (ref >60.00)
Glucose, Bld: 155 mg/dL — ABNORMAL HIGH (ref 70–99)
Potassium: 4.1 mEq/L (ref 3.5–5.1)
Sodium: 140 mEq/L (ref 135–145)

## 2017-11-28 LAB — HEMOGLOBIN A1C: Hgb A1c MFr Bld: 6.9 % — ABNORMAL HIGH (ref 4.6–6.5)

## 2017-11-28 NOTE — Patient Instructions (Signed)
BEFORE YOU LEAVE: -labs -knee exercises -follow up: 3 months  -We placed a referral for you as discussed for your colonoscopy, mammogram and to orthopedics per your request for the knee. It usually takes about 1-2 weeks to process and schedule this referral. If you have not heard from [place] regarding this appointment in 2 weeks please contact our office.  Ice, regular gentle activity, healthy diet and weight reduction can all help the knee while you are waiting to see the orthopedic specialist.  Please use Tylenol or Aleve per instructions only as needed for pain.  Make sure you get your mammogram and colonoscopy as soon as available.  We have ordered labs or studies at this visit. It can take up to 1-2 weeks for results and processing. IF results require follow up or explanation, we will call you with instructions. Clinically stable results will be released to your [facility]. If you have not heard from [place] or cannot find your results in [facility] in 2 weeks please contact our office at [phone].  If you are not yet signed up for [facility], please consider signing up.  Consider counseling, cognitive behavioral therapy.  Please call the number provided to schedule.   Preventive Care 40-64 Years, Female Preventive care refers to lifestyle choices and visits with your health care provider that can promote health and wellness. What does preventive care include?  A yearly physical exam. This is also called an annual well check.  Dental exams once or twice a year.  Routine eye exams. Ask your health care provider how often you should have your eyes checked.  Personal lifestyle choices, including: ? Daily care of your teeth and gums. ? Regular physical activity. ? Eating a healthy diet. ? Avoiding tobacco and drug use. ? Limiting alcohol use. ? Practicing safe sex. ? Taking low-dose aspirin daily starting at age 36. ? Taking vitamin and mineral supplements as recommended by your health care  provider. What happens during an annual well check? The services and screenings done by your health care provider during your annual well check will depend on your age, overall health, lifestyle risk factors, and family history of disease. Counseling Your health care provider may ask you questions about your:  Alcohol use.  Tobacco use.  Drug use.  Emotional well-being.  Home and relationship well-being.  Sexual activity.  Eating habits.  Work and work Statistician.  Method of birth control.  Menstrual cycle.  Pregnancy history.  Screening You may have the following tests or measurements:  Height, weight, and BMI.  Blood pressure.  Lipid and cholesterol levels. These may be checked every 5 years, or more frequently if you are over 31 years old.  Skin check.  Lung cancer screening. You may have this screening every year starting at age 39 if you have a 30-pack-year history of smoking and currently smoke or have quit within the past 15 years.  Fecal occult blood test (FOBT) of the stool. You may have this test every year starting at age 40.  Flexible sigmoidoscopy or colonoscopy. You may have a sigmoidoscopy every 5 years or a colonoscopy every 10 years starting at age 73.  Hepatitis C blood test.  Hepatitis B blood test.  Sexually transmitted disease (STD) testing.  Diabetes screening. This is done by checking your blood sugar (glucose) after you have not eaten for a while (fasting). You may have this done every 1-3 years.  Mammogram. This may be done every 1-2 years. Talk to your health care provider  about when you should start having regular mammograms. This may depend on whether you have a family history of breast cancer.  BRCA-related cancer screening. This may be done if you have a family history of breast, ovarian, tubal, or peritoneal cancers.  Pelvic exam and Pap test. This may be done every 3 years starting at age 52. Starting at age 27, this may be  done every 5 years if you have a Pap test in combination with an HPV test.  Bone density scan. This is done to screen for osteoporosis. You may have this scan if you are at high risk for osteoporosis.  Discuss your test results, treatment options, and if necessary, the need for more tests with your health care provider. Vaccines Your health care provider may recommend certain vaccines, such as:  Influenza vaccine. This is recommended every year.  Tetanus, diphtheria, and acellular pertussis (Tdap, Td) vaccine. You may need a Td booster every 10 years.  Varicella vaccine. You may need this if you have not been vaccinated.  Zoster vaccine. You may need this after age 13.  Measles, mumps, and rubella (MMR) vaccine. You may need at least one dose of MMR if you were born in 1957 or later. You may also need a second dose.  Pneumococcal 13-valent conjugate (PCV13) vaccine. You may need this if you have certain conditions and were not previously vaccinated.  Pneumococcal polysaccharide (PPSV23) vaccine. You may need one or two doses if you smoke cigarettes or if you have certain conditions.  Meningococcal vaccine. You may need this if you have certain conditions.  Hepatitis A vaccine. You may need this if you have certain conditions or if you travel or work in places where you may be exposed to hepatitis A.  Hepatitis B vaccine. You may need this if you have certain conditions or if you travel or work in places where you may be exposed to hepatitis B.  Haemophilus influenzae type b (Hib) vaccine. You may need this if you have certain conditions.  Talk to your health care provider about which screenings and vaccines you need and how often you need them. This information is not intended to replace advice given to you by your health care provider. Make sure you discuss any questions you have with your health care provider. Document Released: 10/24/2015 Document Revised: 06/16/2016 Document  Reviewed: 07/29/2015 Elsevier Interactive Patient Education  [employer].

## 2017-12-01 ENCOUNTER — Other Ambulatory Visit: Payer: Self-pay | Admitting: Family Medicine

## 2017-12-01 DIAGNOSIS — Z139 Encounter for screening, unspecified: Secondary | ICD-10-CM

## 2017-12-08 ENCOUNTER — Encounter: Payer: Self-pay | Admitting: Family Medicine

## 2017-12-08 ENCOUNTER — Ambulatory Visit: Payer: BLUE CROSS/BLUE SHIELD | Admitting: Family Medicine

## 2017-12-08 VITALS — BP 138/70 | HR 78 | Temp 97.8°F | Ht 67.0 in | Wt 208.9 lb

## 2017-12-08 DIAGNOSIS — I10 Essential (primary) hypertension: Secondary | ICD-10-CM

## 2017-12-08 DIAGNOSIS — I152 Hypertension secondary to endocrine disorders: Secondary | ICD-10-CM | POA: Insufficient documentation

## 2017-12-08 DIAGNOSIS — E1159 Type 2 diabetes mellitus with other circulatory complications: Secondary | ICD-10-CM

## 2017-12-08 DIAGNOSIS — E1169 Type 2 diabetes mellitus with other specified complication: Secondary | ICD-10-CM

## 2017-12-08 DIAGNOSIS — E119 Type 2 diabetes mellitus without complications: Secondary | ICD-10-CM | POA: Diagnosis not present

## 2017-12-08 DIAGNOSIS — E785 Hyperlipidemia, unspecified: Secondary | ICD-10-CM

## 2017-12-08 DIAGNOSIS — Z6832 Body mass index (BMI) 32.0-32.9, adult: Secondary | ICD-10-CM

## 2017-12-08 MED ORDER — ROSUVASTATIN CALCIUM 10 MG PO TABS: 10.0000 mg | ORAL_TABLET | Freq: Every day | ORAL | 3 refills | Status: DC

## 2017-12-08 MED FILL — ROSUVASTATIN CALCIUM 10 MG: 10 | 90 days supply | Qty: 90 | Fill #0

## 2017-12-08 NOTE — Progress Notes (Signed)
HPI:  Acute visit to discuss her new findings of worsening diabetes and cholesterol in the setting ofobesity on her labs.  She is very motivated now to try to lose some weight and to change her lifestyle.  She prefers this to taking medications.  After lengthy discussion she is willing to start a statin for the cholesterol.  She does eat a lot of butter, simple starches and some sweets.  She did get a membership to [employer] recently and had started going twice a week.  She is ready to make aggressive changes.  She does want to lose weight.  Her hemoglobin A1c was 6.9.  LDL cholesterol was 157. ROS: See pertinent positives and negatives per HPI.  Past Medical History:  Diagnosis Date  . Allergy   . Anxiety   . Anxiety and depression   . Arthritis   . Depression   . HTN (hypertension)   . Hx of iron deficiency anemia    reports [place] trait; reports on iron her whole life  . Low back pain    chronic, reports hx DDD treated by chiropractor  . No blood products    Jehovah's Witness  . OAB (overactive bladder)   . Seasonal allergies   . Sickle cell trait ([place])   . Uterine fibroid     Past Surgical History:  Procedure Laterality Date  . BUNIONECTOMY Bilateral 1987  . CESAREAN SECTION    . Teeth extracted   01/14/2017  . TUBAL LIGATION    . TUMOR REMOVAL     behind left eye    Family History  Problem Relation Age of Onset  . Diabetes Father   . Hypertension Father   . Dementia Father        senile  . Other Mother        smoker  . Lung cancer Mother        2010  . Bipolar disorder Sister   . Schizophrenia Sister   . Schizophrenia Sister   . Bipolar disorder Sister   . Schizophrenia Sister   . Bipolar disorder Sister   . Bipolar disorder Daughter   . Colon cancer Neg Hx     Social History   Socioeconomic History  . Marital status: Single    Spouse name: None  . Number of children: None  . Years of education: None  . Highest education level: None  Social Needs   . Financial resource strain: None  . Food insecurity - worry: None  . Food insecurity - inability: None  . Transportation needs - medical: None  . Transportation needs - non-medical: None  Occupational History  . None  Tobacco Use  . Smoking status: Never Smoker  . Smokeless tobacco: Never Used  Substance and Sexual Activity  . Alcohol use: Yes    Comment: socially - rare; 2 drinks a few times per month  . Drug use: No  . Sexual activity: None  Other Topics Concern  . None  Social History Narrative   Work or School: Quarry manager, working on Paediatric nurse Situation: Lives with husband - married in 2015; hx of homelessness in 2015      Spiritual Beliefs: Jehovah's Witness      Lifestyle: no regular CV exercise; diet improving - reports she has been able to loose weight                    Current Outpatient Medications:  .  amLODipine (  NORVASC) 5 MG tablet, Take 1 tablet (5 mg total) by mouth daily., Disp: 90 tablet, Rfl: 3 .  calcium-vitamin D (CALCIUM + D) 250-125 MG-UNIT per tablet, Take 1 tablet by mouth daily., Disp: 30 tablet, Rfl: 3 .  cyclobenzaprine (FLEXERIL) 5 MG tablet, Take 1 tablet (5 mg total) by mouth 2 (two) times daily as needed for muscle spasms., Disp: 20 tablet, Rfl: 0 .  Fe Fum-FA-B Cmp-C-Zn-Mg-Mn-Cu (HEMOCYTE PLUS) 106-1 MG CAPS, Take 1 capsule by mouth daily., Disp: 30 each, Rfl: 3 .  fluticasone (FLONASE) 50 MCG/ACT nasal spray, PLACE 1 SPRAY INTO BOTH NOSTRILS AT BEDTIME., Disp: 16 g, Rfl: 3 .  lisinopril (PRINIVIL,ZESTRIL) 20 MG tablet, Take 1 tablet (20 mg total) by mouth daily., Disp: 90 tablet, Rfl: 3 .  naproxen (NAPROSYN) 500 MG tablet, 1 tablet 1-2 times daily as needed for pain, Disp: 14 tablet, Rfl: 0 .  OVER THE COUNTER MEDICATION, Take 2 tablets by mouth daily. Biotin 5034mcg, Disp: , Rfl:  .  tolterodine (DETROL LA) 4 MG 24 hr capsule, Take 1 capsule (4 mg total) daily by mouth., Disp: 90 capsule, Rfl: 1 .  venlafaxine XR (EFFEXOR-XR) 75 MG  24 hr capsule, TAKE 3 CAPSULES BY MOUTH DAILY WITH BREAKFAST, Disp: 90 capsule, Rfl: 3 .  rosuvastatin (CRESTOR) 10 MG tablet, Take 1 tablet (10 mg total) by mouth daily., Disp: 90 tablet, Rfl: 3  EXAM:  Vitals:   12/08/17 1502  BP: 138/70  Pulse: 78  Temp: 97.8 F (36.6 C)    Body mass index is 32.72 kg/m.  GENERAL: vitals reviewed and listed above, alert, oriented, appears well hydrated and in no acute distress  HEENT: atraumatic, conjunttiva clear, no obvious abnormalities on inspection of external nose and ears  NECK: no obvious masses on inspection  LUNGS: clear to auscultation bilaterally, no wheezes, rales or rhonchi, good air movement  CV: HRRR, no peripheral edema  MS: moves all extremities without noticeable abnormality  PSYCH: pleasant and cooperative, no obvious depression or anxiety  ASSESSMENT AND PLAN:  Discussed the following assessment and plan:  Diabetes mellitus without complication ([place])  Hyperlipidemia associated with type 2 diabetes mellitus ([place])  Hypertension associated with diabetes ([place])  BMI 32.0-32.9,adult  More than 50% of over 25 minutes spent in total in caring for this patient was spent face-to-face with the patient, counseling and/or coordinating care.  -reviewed labs, implications, treatment options -Prefers lifestyle changes to medications in terms of treatment -We discussed the importance of maintaining a lifelong commitment to a healthy low sugar diet and regular exercise -See patient instructions for details -We also discussed pharmacological treatment options, she does not want to take medication for the diabetes -She did agree to starting a statin after discussion of risk and benefits, prescription sent -Goal of 10-20 pound weight loss over the next 3-6 months -Follow-up in 3 months with recheck of labs then -Patient advised to return or notify a doctor immediately if symptoms worsen or persist or new concerns  arise.  Patient Instructions  BEFORE YOU LEAVE: -follow up: 3 months and come fasting for 8 hours if possible, ok to drink plenty of water   Start the crestor and take once daily.  Work on a healthy low sugar diet and regular exercise to treat the diabetes and blood pressure. Work on reducing weight by about 10-20 lbs over the next 3-6 months.   We recommend the following healthy lifestyle for LIFE: 1) Small portions. But, make sure to get regular (at  least 3 per day), healthy meals and small healthy snacks if needed.  2) Eat a healthy clean diet.   TRY TO EAT: -at least 5-7 servings of low sugar, colorful, and nutrient rich vegetables per day (not corn, potatoes or bananas.) -berries are the best choice if you wish to eat fruit (only eat small amounts if trying to reduce weight)  -lean meets (fish, white meat of chicken or [place]) -vegan proteins for some meals - beans or tofu, whole grains, nuts and seeds -Replace bad fats with good fats - good fats include: fish, nuts and seeds, canola oil, olive oil -small amounts of low fat or non fat dairy -small amounts of100 % whole grains - check the lables -drink plenty of water  AVOID: -SUGAR, sweets, anything with added sugar, corn syrup or sweeteners - must read labels as even foods advertised as "healthy" often are loaded with sugar -if you must have a sweetener, small amounts of stevia may be best -sweetened beverages and artificially sweetened beverages -simple starches (rice, bread, potatoes, pasta, chips, etc - small amounts of 100% whole grains are ok) -red meat, pork, butter -fried foods, fast food, processed food, excessive dairy, eggs and coconut.  3)Get at least 150 minutes of sweaty aerobic exercise per week.  4)Reduce stress - consider counseling, meditation and relaxation to balance other aspects of your life.     Lucretia Kern, DO

## 2017-12-08 NOTE — Patient Instructions (Signed)
BEFORE YOU LEAVE: -follow up: 3 months and come fasting for 8 hours if possible, ok to drink plenty of water   Start the crestor and take once daily.  Work on a healthy low sugar diet and regular exercise to treat the diabetes and blood pressure. Work on reducing weight by about 10-20 lbs over the next 3-6 months.   We recommend the following healthy lifestyle for LIFE: 1) Small portions. But, make sure to get regular (at least 3 per day), healthy meals and small healthy snacks if needed.  2) Eat a healthy clean diet.   TRY TO EAT: -at least 5-7 servings of low sugar, colorful, and nutrient rich vegetables per day (not corn, potatoes or bananas.) -berries are the best choice if you wish to eat fruit (only eat small amounts if trying to reduce weight)  -lean meets (fish, white meat of chicken or [place]) -vegan proteins for some meals - beans or tofu, whole grains, nuts and seeds -Replace bad fats with good fats - good fats include: fish, nuts and seeds, canola oil, olive oil -small amounts of low fat or non fat dairy -small amounts of100 % whole grains - check the lables -drink plenty of water  AVOID: -SUGAR, sweets, anything with added sugar, corn syrup or sweeteners - must read labels as even foods advertised as "healthy" often are loaded with sugar -if you must have a sweetener, small amounts of stevia may be best -sweetened beverages and artificially sweetened beverages -simple starches (rice, bread, potatoes, pasta, chips, etc - small amounts of 100% whole grains are ok) -red meat, pork, butter -fried foods, fast food, processed food, excessive dairy, eggs and coconut.  3)Get at least 150 minutes of sweaty aerobic exercise per week.  4)Reduce stress - consider counseling, meditation and relaxation to balance other aspects of your life.

## 2017-12-13 ENCOUNTER — Ambulatory Visit (INDEPENDENT_AMBULATORY_CARE_PROVIDER_SITE_OTHER): Payer: Self-pay | Admitting: Orthopaedic Surgery

## 2017-12-19 ENCOUNTER — Ambulatory Visit (INDEPENDENT_AMBULATORY_CARE_PROVIDER_SITE_OTHER): Payer: BLUE CROSS/BLUE SHIELD | Admitting: Orthopaedic Surgery

## 2017-12-19 ENCOUNTER — Encounter (INDEPENDENT_AMBULATORY_CARE_PROVIDER_SITE_OTHER): Payer: Self-pay | Admitting: Orthopaedic Surgery

## 2017-12-19 DIAGNOSIS — M1711 Unilateral primary osteoarthritis, right knee: Secondary | ICD-10-CM | POA: Diagnosis not present

## 2017-12-19 DIAGNOSIS — M7061 Trochanteric bursitis, right hip: Secondary | ICD-10-CM | POA: Diagnosis not present

## 2017-12-19 DIAGNOSIS — M545 Low back pain: Secondary | ICD-10-CM | POA: Diagnosis not present

## 2017-12-19 MED ORDER — LIDOCAINE HCL 1 % IJ SOLN
2.0000 mL | INTRAMUSCULAR | Status: AC | PRN
  Administered 2017-12-19: 2 mL

## 2017-12-19 MED ORDER — BUPIVACAINE HCL 0.5 % IJ SOLN
2.0000 mL | INTRAMUSCULAR | Status: AC | PRN
  Administered 2017-12-19: 2 mL via INTRA_ARTICULAR

## 2017-12-19 MED ORDER — METHYLPREDNISOLONE ACETATE 40 MG/ML IJ SUSP
40.0000 mg | INTRAMUSCULAR | Status: AC | PRN
  Administered 2017-12-19: 40 mg via INTRA_ARTICULAR

## 2017-12-19 MED ORDER — DICLOFENAC SODIUM 1 % TD GEL: 2.0000 g | Freq: Four times a day (QID) | TRANSDERMAL | 5 refills | Status: AC

## 2017-12-19 NOTE — Progress Notes (Signed)
Office Visit Note   Patient: Kilee Hedding           Date of Birth: [birth year]           MRN: [id number] Visit Date: 12/19/2017              Requested by: Lucretia Kern, DO [place], [place] [address] PCP: Lucretia Kern, DO   Assessment & Plan: Visit Diagnoses:  1. Unilateral primary osteoarthritis, right knee     Plan: Impression is right knee arthritis exacerbation.  Prescription for Voltaren gel.  Corticosteroid injection was performed today.  Patient tolerates well.  Home exercises were provided.  Follow-up as needed.  Follow-Up Instructions: Return if symptoms worsen or fail to improve.   Orders:  No orders of the defined types were placed in this encounter.  Meds ordered this encounter  Medications  . diclofenac sodium (VOLTAREN) 1 % GEL    Sig: Apply 2 g topically 4 (four) times daily.    Dispense:  1 Tube    Refill:  5      Procedures: Large Joint Inj: R knee on 12/19/2017 1:37 PM Indications: pain Details: 22 G needle  Arthrogram: No  Medications: 40 mg methylPREDNISolone acetate 40 MG/ML; 2 mL lidocaine 1 %; 2 mL bupivacaine 0.5 % Consent was given by the patient. Patient was prepped and draped in the usual sterile fashion.       Clinical Data: No additional findings.   Subjective: Chief Complaint  Patient presents with  . Right Knee - Pain    Patient 62 year old female comes in with right knee pain for about 3 weeks.  Denies any recent injuries.  She is walking with a limp.  She denies any swelling.  She does endorse throbbing pain.  She takes naproxen with partial relief.  Denies any numbness or tingling or radiation of pain.    Review of Systems  Constitutional: Negative.   HENT: Negative.   Eyes: Negative.   Respiratory: Negative.   Cardiovascular: Negative.   Endocrine: Negative.   Musculoskeletal: Negative.   Neurological: Negative.   Hematological: Negative.   Psychiatric/Behavioral: Negative.   All other  systems reviewed and are negative.    Objective: Vital Signs: There were no vitals taken for this visit.  Physical Exam  Constitutional: She is oriented to person, place, and time. She appears well-developed and well-nourished.  HENT:  Head: Normocephalic and atraumatic.  Eyes: EOM are normal.  Neck: Neck supple.  Pulmonary/Chest: Effort normal.  Abdominal: Soft.  Neurological: She is alert and oriented to person, place, and time.  Skin: Skin is warm. Capillary refill takes less than 2 seconds.  Psychiatric: She has a normal mood and affect. Her behavior is normal. Judgment and thought content normal.  Nursing note and vitals reviewed.   Ortho Exam Right knee exam shows a trace effusion.  Collaterals and cruciates are stable.  Mild medial joint line tenderness.  No significant patellofemoral crepitus Specialty Comments:  No specialty comments available.  Imaging: No results found.   PMFS History: Patient Active Problem List   Diagnosis Date Noted  . Diabetes mellitus without complication ([place]) 78/29/5621  . Hypertension associated with diabetes ([place]) 12/08/2017  . BMI 32.0-32.9,adult 12/08/2017  . Seasonal allergies 01/21/2015  . OAB (overactive bladder) 01/21/2015  . Hyperlipidemia associated with type 2 diabetes mellitus ([place]) 02/14/2014  . Obesity 12/19/2009  . LOW BACK PAIN 03/18/2008  . DEPRESSION/ANXIETY 06/15/2007  . HYPERTENSION, BENIGN 06/15/2007   Past  Medical History:  Diagnosis Date  . Allergy   . Anxiety   . Anxiety and depression   . Arthritis   . Depression   . HTN (hypertension)   . Hx of iron deficiency anemia    reports [place] trait; reports on iron her whole life  . Low back pain    chronic, reports hx DDD treated by chiropractor  . No blood products    Jehovah's Witness  . OAB (overactive bladder)   . Seasonal allergies   . Sickle cell trait ([place])   . Uterine fibroid     Family History  Problem Relation Age of Onset  . Diabetes Father     . Hypertension Father   . Dementia Father        senile  . Other Mother        smoker  . Lung cancer Mother        2010  . Bipolar disorder Sister   . Schizophrenia Sister   . Schizophrenia Sister   . Bipolar disorder Sister   . Schizophrenia Sister   . Bipolar disorder Sister   . Bipolar disorder Daughter   . Colon cancer Neg Hx     Past Surgical History:  Procedure Laterality Date  . BUNIONECTOMY Bilateral 1987  . CESAREAN SECTION    . Teeth extracted   01/14/2017  . TUBAL LIGATION    . TUMOR REMOVAL     behind left eye   Social History   Occupational History  . Not on file  Tobacco Use  . Smoking status: Never Smoker  . Smokeless tobacco: Never Used  Substance and Sexual Activity  . Alcohol use: Yes    Comment: socially - rare; 2 drinks a few times per month  . Drug use: No  . Sexual activity: Not on file

## 2017-12-20 ENCOUNTER — Ambulatory Visit: Payer: Self-pay

## 2017-12-23 ENCOUNTER — Telehealth: Payer: Self-pay | Admitting: Family Medicine

## 2017-12-23 DIAGNOSIS — M5441 Lumbago with sciatica, right side: Secondary | ICD-10-CM

## 2017-12-23 NOTE — Telephone Encounter (Signed)
Copied from [place] [id number]. Topic: Quick Communication - See Telephone Encounter >> Dec 23, 2017 12:15 PM Cleaster Corin, NT wrote: CRM for notification. See Telephone encounter for:   12/23/17. Pt. Calling to see if Dr. Maudie Mercury would send a referral to dr. Erlinda Hong for her back pain

## 2017-12-25 NOTE — Telephone Encounter (Signed)
Ok to place referral.

## 2017-12-26 NOTE — Telephone Encounter (Signed)
Referral placed and I left a detailed message at the pts cell number with this information. 

## 2017-12-30 MED FILL — VENLAFAXINE HCL ER 75 MG CA: 75 | 30 days supply | Qty: 90 | Fill #1

## 2018-01-13 ENCOUNTER — Telehealth: Payer: Self-pay | Admitting: Family Medicine

## 2018-01-13 NOTE — Telephone Encounter (Signed)
Copied from [place] [id number]. Topic: Quick Communication - See Telephone Encounter >> Jan 13, 2018  8:20 AM Robina Ade, Helene Kelp D wrote: CRM for notification. See Telephone encounter for: 01/13/18. Patient called and would like a copy of her CPE notes. She will be by in the next hour if its possible for her to get. Please call patient if there are any question.

## 2018-01-13 NOTE — Telephone Encounter (Signed)
Office notes from 11/28/2017 printed and left at the front desk for pick up.

## 2018-01-25 ENCOUNTER — Telehealth: Payer: Self-pay | Admitting: Family Medicine

## 2018-01-25 NOTE — Telephone Encounter (Signed)
Copied from [place] [phone]. Topic: Quick Communication - Rx Refill/Question >> Jan 25, 2018  7:09 AM Lennox Solders wrote: Medication: detrol la Has the patient contacted their pharmacy yes . Pt can not afford the detrol. Pt would like to different  medication. Pt does not want to check with the pharm to see what medication would be cheaper. [facility] outpt pharm

## 2018-01-25 NOTE — Telephone Encounter (Signed)
She does need to check with her insurance about which medications are covered so that we can better help her. Appt so can discuss symptoms, options, other things she has tried. Thanks.

## 2018-01-26 NOTE — Telephone Encounter (Signed)
I called the pt and informed her of the message below.  She agreed to contact the insurance and call back.

## 2018-01-30 ENCOUNTER — Telehealth: Payer: Self-pay

## 2018-01-30 NOTE — Telephone Encounter (Signed)
Called and left a message , pt did not show up for previsit today. Advised to call back by 5:00 pm in order to keep colonoscopy as schedule.

## 2018-01-30 NOTE — Telephone Encounter (Signed)
Called pt again no answer , appointments canceled and letter sent.

## 2018-01-31 ENCOUNTER — Encounter: Payer: Self-pay | Admitting: Family Medicine

## 2018-02-01 MED FILL — VENLAFAXINE HCL ER 75 MG CA: 75 | 15 days supply | Qty: 45 | Fill #2

## 2018-02-03 MED FILL — TOLTERODINE TART ER 4 MG CA: 4 | 5 days supply | Qty: 5 | Fill #1

## 2018-02-13 ENCOUNTER — Encounter: Payer: Self-pay | Admitting: Gastroenterology

## 2018-02-21 MED FILL — VENLAFAXINE HCL ER 75 MG CA: 75 | 2 days supply | Qty: 6 | Fill #3

## 2018-02-24 MED FILL — VENLAFAXINE HCL ER 75 MG CA: 75 | 15 days supply | Qty: 45 | Fill #4 | Status: TO

## 2018-02-28 ENCOUNTER — Ambulatory Visit: Payer: Self-pay | Admitting: Family Medicine

## 2018-03-13 ENCOUNTER — Encounter: Payer: Self-pay | Admitting: Family Medicine

## 2018-04-18 MED FILL — NAPROXEN 500 MG TABLET: 500 | 30 days supply | Qty: 60 | Fill #0

## 2018-06-07 ENCOUNTER — Telehealth: Payer: Self-pay | Admitting: Family Medicine

## 2018-06-07 NOTE — Telephone Encounter (Signed)
Copied from [place] [id number]. Topic: General - Other >> Jun 07, 2018 10:24 AM Carolyn Stare wrote: Pt call to say she need a letter stating that she can not walk but so far because her back pain. [facility] will be taking her around since she does not have a vehicle   [phone]

## 2018-06-08 NOTE — Telephone Encounter (Signed)
I am so sorry to here she is not feeling well. Is she seeing a specialist about her back? If so, the letter should come from them. If she is not seeing a specialist and is havin they severe of back pain I recommend evaluation with a specialist and would place a referral.

## 2018-06-08 NOTE — Telephone Encounter (Signed)
Patient says she does not have insurance right now and wants to know if there is anything else Dr. Maudie Mercury can do?

## 2018-06-08 NOTE — Telephone Encounter (Signed)
Unable to leave a message as voicemail states the patient is not accepting calls at this time.

## 2018-06-09 NOTE — Telephone Encounter (Signed)
I left a message for the pt to return my call.  CRM also created. 

## 2018-06-09 NOTE — Telephone Encounter (Signed)
She can come for office visit here, but if back pain is severe, needs to see specialist to find out what is going on and treat so going straight to specialist may save money on OV here. I am concerned she could have something serious going on and I recommend evaluation.

## 2018-06-13 NOTE — Telephone Encounter (Signed)
I called the pt, informed her of the message below and she stated she will call back for an appt.

## 2018-06-14 ENCOUNTER — Telehealth: Payer: Self-pay | Admitting: Family Medicine

## 2018-06-14 NOTE — Telephone Encounter (Signed)
Copied from [place] [phone]. Topic: Quick Communication - Rx Refill/Question >> Jun 14, 2018  1:36 PM Keene Breath wrote: Medication:   amLODipine (NORVASC) 5 MG tablet lisinopril (PRINIVIL,ZESTRIL) 20 MG tablet venlafaxine XR (EFFEXOR-XR) 75 MG 24 hr capsule  Patient call to request a refill for the above medications.  CB# [phone]  Preferred Pharmacy (with phone number or street name): [place], [place] - [address] [phone] (Phone) [phone] (Fax)

## 2018-06-14 NOTE — Telephone Encounter (Signed)
Attempted to contact pt regarding refill requests for venlafaxine, lisinopril, and amlodipine; per Manuela Schwartz at [facility] the refill for venlafaxine was transferred 02/2018; the last fill date for lisinopril was 11/15/17 and the last fill date for amlodipine was 10/27/17; the pt does have refills; attempted to verify is pt has been taking these medications; unable to leave message at [phone].

## 2018-06-19 ENCOUNTER — Other Ambulatory Visit: Payer: Self-pay | Admitting: Family Medicine

## 2018-06-19 MED FILL — VENLAFAXINE HCL ER 75 MG CA: 75 | 4 days supply | Qty: 12 | Fill #0

## 2018-06-21 MED FILL — VENLAFAXINE HCL ER 75 MG CA: 75 | 7 days supply | Qty: 21 | Fill #1

## 2018-07-03 MED FILL — VENLAFAXINE HCL ER 75 MG CA: 75 | 8 days supply | Qty: 25 | Fill #2

## 2018-07-03 MED FILL — LISINOPRIL 20 MG TABLET: 20 | 90 days supply | Qty: 90 | Fill #1

## 2018-07-03 MED FILL — AMLODIPINE BESYLATE 5 MG TA: 5 | 90 days supply | Qty: 90 | Fill #1

## 2018-07-10 ENCOUNTER — Telehealth: Payer: Self-pay | Admitting: Family Medicine

## 2018-07-10 NOTE — Telephone Encounter (Signed)
Copied from [place] [id number]. Topic: General - Other >> Jul 10, 2018  1:45 PM Nils Flack, Marland Kitchen wrote: Reason for CRM: pt called is asking if she can get a form filled out for a handicap card.  Please call when ready for pickup.  [phone]

## 2018-07-11 NOTE — Telephone Encounter (Signed)
I left a message for the pt to return my call.  CRM also created. 

## 2018-07-11 NOTE — Telephone Encounter (Signed)
Needs to let us know what handicap is - you can read list to her? We do not usually recommend these as in most cases except severe (surgery, wheelchair use, etc) the more walking the better, but happy to review. Also, if seeing specialist for the issues should complete with them.

## 2018-07-21 MED FILL — VENLAFAXINE HCL ER 75 MG CA: 75 | 8 days supply | Qty: 25 | Fill #3

## 2018-07-25 NOTE — Telephone Encounter (Signed)
Patient states that she presently had 2 falls which has caused pain "bursitis" with her right leg.  The pain increases when she "is on her feet a lot". Patient would also like to know if she can drop off a form for [facility] to be completed?

## 2018-07-25 NOTE — Telephone Encounter (Signed)
Left message on machine for patient to return our call 

## 2018-07-25 NOTE — Telephone Encounter (Signed)
recommend seeing ortho or sports med for evaluation to confirm dx, treat, ok to do temporary (1 month wheelchair decal) in interim. Would need formal evaluation/diagnosis for SC[facility] paperwork. Ok to place referral to ortho or sports medicine. If has already seen specialist, they may be able to complete this paperwork.

## 2018-08-01 ENCOUNTER — Other Ambulatory Visit: Payer: Self-pay | Admitting: Family Medicine

## 2018-08-01 MED FILL — VENLAFAXINE HCL ER 75 MG CA: 75 | 2 days supply | Qty: 7 | Fill #4

## 2018-08-10 MED FILL — VENLAFAXINE HCL ER 75 MG CA: 75 | 30 days supply | Qty: 90 | Fill #0

## 2018-09-11 ENCOUNTER — Other Ambulatory Visit: Payer: Self-pay | Admitting: Family Medicine

## 2018-09-11 NOTE — Telephone Encounter (Signed)
Copied from [place] [id number]. Topic: Quick Communication - Rx Refill/Question >> Sep 11, 2018  3:23 PM Sallee Provencal, Jaydyn Bozzo B, NT wrote: Medication: lisinopril (PRINIVIL,ZESTRIL) 20 MG tablet amLODipine (NORVASC) 5 MG tablet  fluticasone (FLONASE) 50 MCG/ACT nasal spray tolterodine (DETROL LA) 4 MG 24 hr capsule  Has the patient contacted their pharmacy? Yes.  To contact the office, "it will be faster" (Agent: If no, request that the patient contact the pharmacy for the refill.) (Agent: If yes, when and what did the pharmacy advise?)  Preferred Pharmacy (with phone number or street name): [facility], [place]: Please be advised that RX refills may take up to 3 business days. We ask that you follow-up with your pharmacy.

## 2018-09-12 MED ORDER — TOLTERODINE TARTRATE ER 4 MG PO CP24: 4.0000 mg | ORAL_CAPSULE | Freq: Every day | ORAL | 0 refills | Status: DC

## 2018-09-12 MED ORDER — FLUTICASONE PROPIONATE 50 MCG/ACT NA SUSP: 1.0000 | Freq: Every day | NASAL | 0 refills | Status: DC

## 2018-09-12 MED FILL — FLUTICASONE PROP 50 MCG SPR: 50 | 60 days supply | Qty: 16 | Fill #0

## 2018-09-12 MED FILL — TOLTERODINE TART ER 4 MG CA: 4 | 30 days supply | Qty: 30 | Fill #0

## 2018-09-12 MED FILL — VENLAFAXINE HCL ER 75 MG CA: 75 | 30 days supply | Qty: 90 | Fill #0

## 2018-09-12 NOTE — Telephone Encounter (Signed)
Left message for pt to return call to the office. Pt will need to be scheduled for f/u appt with Dr. Maudie Mercury. LOV on 11/28/17 Lisinopril and Amlodipine have refills available at requested pharmacy.

## 2018-09-27 ENCOUNTER — Ambulatory Visit (INDEPENDENT_AMBULATORY_CARE_PROVIDER_SITE_OTHER): Payer: Self-pay

## 2018-09-27 ENCOUNTER — Ambulatory Visit (INDEPENDENT_AMBULATORY_CARE_PROVIDER_SITE_OTHER): Payer: BLUE CROSS/BLUE SHIELD | Admitting: Orthopaedic Surgery

## 2018-09-27 ENCOUNTER — Encounter (INDEPENDENT_AMBULATORY_CARE_PROVIDER_SITE_OTHER): Payer: Self-pay | Admitting: Orthopaedic Surgery

## 2018-09-27 DIAGNOSIS — G8929 Other chronic pain: Secondary | ICD-10-CM

## 2018-09-27 DIAGNOSIS — M1611 Unilateral primary osteoarthritis, right hip: Secondary | ICD-10-CM | POA: Diagnosis not present

## 2018-09-27 DIAGNOSIS — M545 Low back pain, unspecified: Secondary | ICD-10-CM

## 2018-09-27 NOTE — Progress Notes (Signed)
Subjective: She is here with low back and right hip pain.  Here for a diagnostic/therapeutic ultrasound-guided intra-articular right hip injection.  Objective: She has pain with right hip flexion and external rotation, a little bit of pain with internal rotation.  Still has good range of motion of her hip.  Procedure: Ultrasound-guided right hip intra-articular injection: After sterile prep with Betadine, injected 8 cc 1% lidocaine without epinephrine and 40 mg methylprednisolone using a 22-gauge spinal needle, passing the needle through the iliofemoral ligament into the femoral head/neck junction.  Injectate was seen filling the joint capsule.  She had some improvement during the immediate anesthetic phase, but not dramatic.  She will follow-up with Dr. Erlinda Hong as directed.

## 2018-09-27 NOTE — Progress Notes (Signed)
Office Visit Note   Patient: Rachel Fields           Date of Birth: 1956-03-24           MRN: [id number] Visit Date: 09/27/2018              Requested by: Lucretia Kern, DO [place], [place] [address] PCP: Lucretia Kern, DO   Assessment & Plan: Visit Diagnoses:  1. Chronic low back pain, unspecified back pain laterality, unspecified whether sciatica present   2. Unilateral primary osteoarthritis, right hip     Plan: Impression is right hip degenerative joint disease and right sided lumbar radiculopathy.  We will refer the patient to Dr. Junius Roads for a diagnostic and hopefully therapeutic right hip intra-articular cortisone injection.  We will write her a note for desk work only for the next 2 weeks.  She will give the hip injection at least 2 weeks time and if she is still having pain to the back of her right lower extremity, she will follow-up with [place] and we will further work-up her back.  Follow-Up Instructions: Return if symptoms worsen or fail to improve.   Orders:  Orders Placed This Encounter  Procedures  . XR Lumbar Spine 2-3 Views  . XR HIP UNILAT W OR W/O PELVIS 2-3 VIEWS RIGHT   No orders of the defined types were placed in this encounter.     Procedures: No procedures performed   Clinical Data: No additional findings.   Subjective: Chief Complaint  Patient presents with  . Lower Back - Pain    HPI patient is a pleasant 62 year old female who presents to our clinic today with lower back pain and right lower extremity pain.  This is been ongoing for the past several years.  History of left herniated disc L2-3 with previous epidural steroid injection.  The pain she is now having is to the opposite side.  The pain she has is to her right lower back radiating into the groin, top of the thigh and down the back of the leg.  Pain appears to be worse when standing or sitting for too long a period of time.  She has been taking Advil and muscle relaxers  with mild relief of symptoms.  No radicular symptoms noted.  Review of Systems as detailed in HPI.  All others reviewed and are negative.   Objective: Vital Signs: There were no vitals taken for this visit.  Physical Exam well-developed well-nourished female in no acute distress.  Alert and oriented x3.  Ortho Exam examination of her lumbar spine reveals increased pain with lumbar extension and rotation to the right.  Positive straight leg raise and positive logroll.  No focal weakness.  She is neurovascularly intact distally.  Specialty Comments:  No specialty comments available.  Imaging: Xr Hip Unilat W Or W/o Pelvis 2-3 Views Right  Result Date: 09/27/2018 Moderate joint space narrowing  Xr Lumbar Spine 2-3 Views  Result Date: 09/27/2018 Moderate disc space narrowing at L5-S1    PMFS History: Patient Active Problem List   Diagnosis Date Noted  . Unilateral primary osteoarthritis, right hip 09/27/2018  . Diabetes mellitus without complication ([place]) 69/48/5462  . Hypertension associated with diabetes ([place]) 12/08/2017  . BMI 32.0-32.9,adult 12/08/2017  . Seasonal allergies 01/21/2015  . OAB (overactive bladder) 01/21/2015  . Hyperlipidemia associated with type 2 diabetes mellitus ([place]) 02/14/2014  . Obesity 12/19/2009  . LOW BACK PAIN 03/18/2008  . DEPRESSION/ANXIETY 06/15/2007  .  HYPERTENSION, BENIGN 06/15/2007   Past Medical History:  Diagnosis Date  . Allergy   . Anxiety   . Anxiety and depression   . Arthritis   . Depression   . HTN (hypertension)   . Hx of iron deficiency anemia    reports Ferris trait; reports on iron her whole life  . Low back pain    chronic, reports hx DDD treated by chiropractor  . No blood products    Jehovah's Witness  . OAB (overactive bladder)   . Seasonal allergies   . Sickle cell trait ([place])   . Uterine fibroid     Family History  Problem Relation Age of Onset  . Diabetes Father   . Hypertension Father   . Dementia Father         senile  . Other Mother        smoker  . Lung cancer Mother        2010  . Bipolar disorder Sister   . Schizophrenia Sister   . Schizophrenia Sister   . Bipolar disorder Sister   . Schizophrenia Sister   . Bipolar disorder Sister   . Bipolar disorder Daughter   . Colon cancer Neg Hx     Past Surgical History:  Procedure Laterality Date  . BUNIONECTOMY Bilateral 1987  . CESAREAN SECTION    . Teeth extracted   01/14/2017  . TUBAL LIGATION    . TUMOR REMOVAL     behind left eye   Social History   Occupational History  . Not on file  Tobacco Use  . Smoking status: Never Smoker  . Smokeless tobacco: Never Used  Substance and Sexual Activity  . Alcohol use: Yes    Comment: socially - rare; 2 drinks a few times per month  . Drug use: No  . Sexual activity: Not on file

## 2018-09-28 ENCOUNTER — Telehealth (INDEPENDENT_AMBULATORY_CARE_PROVIDER_SITE_OTHER): Payer: Self-pay | Admitting: Orthopaedic Surgery

## 2018-09-28 NOTE — Telephone Encounter (Signed)
[facility]  [phone] Please  fax to Darel Hong     Patient job called in need of a more detailed work note  No bending  No twisting   No more that 20 pounds of lifting  No standing for long periods of time

## 2018-09-29 NOTE — Telephone Encounter (Signed)
sure

## 2018-09-29 NOTE — Telephone Encounter (Signed)
Please be very specific

## 2018-10-02 ENCOUNTER — Other Ambulatory Visit (INDEPENDENT_AMBULATORY_CARE_PROVIDER_SITE_OTHER): Payer: Self-pay

## 2018-10-02 NOTE — Telephone Encounter (Signed)
Pt was in the office 09/27/18 Dr. Erlinda Hong gave note for desk work for two weeks. Pt needed at faxed note to ATT: Darel Hong. Note states no lifting greater than 20 lbs , bending, twisting, or prolonged standing for 2 weeks.

## 2018-10-12 ENCOUNTER — Other Ambulatory Visit: Payer: Self-pay | Admitting: Family Medicine

## 2018-10-12 NOTE — Telephone Encounter (Signed)
Copied from [place] [id number]. Topic: Quick Communication - Rx Refill/Question >> Oct 12, 2018  5:11 PM Yvette Rack wrote: Pt scheduled an appt for 10/17/18 at 3 pm and requests enough medication to last until her appt.  Medication: venlafaxine XR (EFFEXOR-XR) 75 MG 24 hr capsule  Has the patient contacted their pharmacy? yes   Preferred Pharmacy (with phone number or street name): [id number] - [place], [place] [phone] (Phone)  [phone] (Fax)  Agent: Please be advised that RX refills may take up to 3 business days. We ask that you follow-up with your pharmacy.

## 2018-10-12 NOTE — Telephone Encounter (Signed)
Courtesy refill given until appt on 10/17/18

## 2018-10-13 ENCOUNTER — Telehealth (INDEPENDENT_AMBULATORY_CARE_PROVIDER_SITE_OTHER): Payer: Self-pay | Admitting: Orthopaedic Surgery

## 2018-10-13 NOTE — Telephone Encounter (Signed)
Patient has appt on 1/7

## 2018-10-13 NOTE — Telephone Encounter (Signed)
Patient calling back to inquire if Dr. Maudie Mercury has had a chance to review paperwork for SCAT transportation, stating that she has seen Dr. Erlinda Hong as suggested.

## 2018-10-13 NOTE — Telephone Encounter (Signed)
Patient called wanting t know if [facility] received any letter/note from Dr.Kim. Dr. Maudie Mercury wanted Dr Erlinda Hong approval.  Please call patient to advise if letter was received and/or filled out and returned.  Please call patient to advise [phone]

## 2018-10-16 ENCOUNTER — Telehealth: Payer: Self-pay

## 2018-10-16 ENCOUNTER — Other Ambulatory Visit: Payer: Self-pay | Admitting: Family Medicine

## 2018-10-16 NOTE — Telephone Encounter (Signed)
I left a detailed message at the pts cell number with the information below. 

## 2018-10-16 NOTE — Telephone Encounter (Signed)
Copied from [place] [phone]. Topic: Quick Communication - Rx Refill/Question >> Oct 16, 2018  5:39 PM Blase Mess A wrote: Medication: cyclobenzaprine (FLEXERIL) 5 MG tablet [[id number]] , amLODipine (NORVASC) 5 MG tablet [[id number]] , venlafaxine XR (EFFEXOR-XR) 75 MG 24 hr capsule [[id number]] (request was sent pharmacy said that the script was 3pills) , lisinopril (PRINIVIL,ZESTRIL) 20 MG tablet [[id number]]   Has the patient contacted their pharmacy? Yes  (Agent: If no, request that the patient contact the pharmacy for the refill.) (Agent: If yes, when and what did the pharmacy advise?)  Preferred Pharmacy (with phone number or street name): [facility] [address] [place], [place] [address] [phone]  Agent: Please be advised that RX refills may take up to 3 business days. We ask that you follow-up with your pharmacy.

## 2018-10-16 NOTE — Telephone Encounter (Signed)
Patient requesting bring the weight she can lift to be changed to 35lbs. Please keep all the other restrictions noted. Also needs a back brace ordered. Please notify patient whether she needs to bring in another GTA  form.

## 2018-10-16 NOTE — Telephone Encounter (Signed)
This should be completed by managing/treating physician. If they can not complete, she would need to bring documentation from her specialist describing her limitations and the paperwork. However, would be better for them to complete as there are a number of questions regarding her limitations. Thanks.

## 2018-10-16 NOTE — Telephone Encounter (Signed)
SCAT paperwork is not usually done for a short duration such as 2 weeks. This paperwork should be completed but the doctor recommending the limitations. Thanks.

## 2018-10-16 NOTE — Telephone Encounter (Signed)
Per Dr. Maudie Mercury pt will need to have Dr. Erlinda Hong complete all forms regarding any restrictions he has recommended. We cannot complete forms with another providers recommendations.   LMTCB to advise

## 2018-10-16 NOTE — Telephone Encounter (Signed)
Copied from [place] [id number]. Topic: General - Other >> Oct 16, 2018  9:42 AM Alanda Slim E wrote: Reason for CRM: Pt needs Dr. Maudie Mercury to fill out GTA application for Pt stating what Dr. Erlinda Hong sent in his note - Patient was evaluated in the office on 09/27/18. At that time was advised to do desk work only for two weeks. No bending, twisting or lifting greater than 20 lbs. No prolonged standing or walking for 2 weeks. Please give Pt a call when the application has been filled out by Dr. Maudie Mercury. Pt needs asap.

## 2018-10-17 ENCOUNTER — Ambulatory Visit: Payer: Self-pay | Admitting: Family Medicine

## 2018-10-17 DIAGNOSIS — Z0289 Encounter for other administrative examinations: Secondary | ICD-10-CM

## 2018-10-17 NOTE — Telephone Encounter (Signed)
Requested medication (s) are due for refill today: all medications yes  Requested medication (s) are on the active medication list: yes  Last refill:  09/11/18  Future visit scheduled: yes 10/17/2018    Notes to clinic:  Flexeril not delegated    Requested Prescriptions  Pending Prescriptions Disp Refills   cyclobenzaprine (FLEXERIL) 5 MG tablet 20 tablet 0    Sig: Take 1 tablet (5 mg total) by mouth 2 (two) times daily as needed for muscle spasms.     Not Delegated - Analgesics:  Muscle Relaxants Failed - 10/17/2018  7:05 AM      Failed - This refill cannot be delegated      Failed - Valid encounter within last 6 months    Recent Outpatient Visits          10 months ago Diabetes mellitus without complication ([place])   Therapist, music at [facility], [place], DO   10 months ago Visit for preventive health examination   Therapist, music at [facility], [place], DO   11 months ago HYPERTENSION, [place] at [facility], [place], DO   11 months ago Essential hypertension   Therapist, music at [facility], [place], DO   1 year ago Dyslipidemia   Therapist, music at [facility], [place], DO      Future Appointments            Today Kim, [place] at [place], [facility]          amLODipine (NORVASC) 5 MG tablet 30 tablet 0    Sig: Take 1 tablet (5 mg total) by mouth daily.     Cardiovascular:  Calcium Channel Blockers Failed - 10/17/2018  7:05 AM      Failed - Valid encounter within last 6 months    Recent Outpatient Visits          10 months ago Diabetes mellitus without complication ([place])   Therapist, music at [facility], [place], DO   10 months ago Visit for preventive health examination   Therapist, music at [facility], [place], DO   11 months ago HYPERTENSION, [place] at [facility], [place], DO   11 months ago Essential hypertension   Therapist, music at [facility], [place], DO   1 year ago Dyslipidemia   Therapist, music at [facility], [place], DO      Future Appointments            Today Maudie Mercury, [place] at [place], [place] BP in normal range    BP Readings from Last 1 Encounters:  12/08/17 138/70        venlafaxine XR (EFFEXOR-XR) 75 MG 24 hr capsule 15 capsule 0    Sig: TAKE 3 CAPSULES BY MOUTH EVERY MORNING WITH BREAKFAST     Psychiatry: Antidepressants - SNRI - desvenlafaxine & venlafaxine Failed - 10/17/2018  7:05 AM      Failed - LDL in normal range and within 360 days    LDL Cholesterol  Date Value Ref Range Status  11/28/2017 157 (H) 0 - 99 mg/dL Final         Failed - Total Cholesterol in normal range and within 360 days    Cholesterol  Date Value Ref Range Status  11/28/2017 233 (H) 0 - 200 mg/dL Final  Comment:    ATP III Classification       Desirable:  < 200 mg/dL               Borderline High:  200 - 239 mg/dL          High:  > = 240 mg/dL         Failed - Valid encounter within last 6 months    Recent Outpatient Visits          10 months ago Diabetes mellitus without complication ([place])   Therapist, music at [facility], [place], DO   10 months ago Visit for preventive health examination   Therapist, music at [facility], [place], DO   11 months ago HYPERTENSION, [place] at [facility], [place], DO   11 months ago Essential hypertension   Therapist, music at [facility], [place], DO   1 year ago Dyslipidemia   Therapist, music at [facility], [place], DO      Future Appointments            Today Kim, [place] at [place], [place] - Triglycerides in normal range and within 360 days    Triglycerides  Date Value Ref Range Status  11/28/2017 100.0 0.0 - 149.0 mg/dL Final    Comment:    Normal:  <150 mg/dLBorderline High:  150 - 199 mg/dL         Passed -  Completed PHQ-2 or PHQ-9 in the last 360 days.      Passed - Last BP in normal range    BP Readings from Last 1 Encounters:  12/08/17 138/70        lisinopril (PRINIVIL,ZESTRIL) 20 MG tablet 90 tablet 3    Sig: Take 1 tablet (20 mg total) by mouth daily.     Cardiovascular:  ACE Inhibitors Failed - 10/17/2018  7:05 AM      Failed - Cr in normal range and within 180 days    Creat  Date Value Ref Range Status  02/14/2014 0.94 0.50 - 1.10 mg/dL Final   Creatinine, Ser  Date Value Ref Range Status  11/28/2017 0.94 0.40 - 1.20 mg/dL Final         Failed - K in normal range and within 180 days    Potassium  Date Value Ref Range Status  11/28/2017 4.1 3.5 - 5.1 mEq/L Final         Failed - Valid encounter within last 6 months    Recent Outpatient Visits          10 months ago Diabetes mellitus without complication ([place])   Therapist, music at [facility], [place], DO   10 months ago Visit for preventive health examination   Therapist, music at [facility], [place], DO   11 months ago HYPERTENSION, [place] at [facility], [place], DO   11 months ago Essential hypertension   Therapist, music at [facility], [place], DO   1 year ago Dyslipidemia   Therapist, music at [facility], [place], DO      Future Appointments            Today Maudie Mercury, [place], [place] at [place], [place] - Patient is not pregnant      Passed - Last  BP in normal range    BP Readings from Last 1 Encounters:  12/08/17 138/70

## 2018-10-18 MED ORDER — AMLODIPINE BESYLATE 5 MG PO TABS: 5.0000 mg | ORAL_TABLET | Freq: Every day | ORAL | 0 refills | Status: DC

## 2018-10-18 MED ORDER — LISINOPRIL 20 MG PO TABS: 20.0000 mg | ORAL_TABLET | Freq: Every day | ORAL | 0 refills | Status: DC

## 2018-10-18 MED ORDER — VENLAFAXINE HCL ER 75 MG PO CP24: ORAL_CAPSULE | ORAL | 0 refills | Status: DC

## 2018-10-20 NOTE — Telephone Encounter (Signed)
Not that I know of .

## 2018-10-20 NOTE — Telephone Encounter (Signed)
Do you know anything about this. I have not received fax.

## 2018-11-02 ENCOUNTER — Telehealth (INDEPENDENT_AMBULATORY_CARE_PROVIDER_SITE_OTHER): Payer: Self-pay | Admitting: Orthopaedic Surgery

## 2018-11-02 NOTE — Telephone Encounter (Signed)
Fields, Rachel 11/02/2018 09:48 AM  Pt asked if the form could be faxed to Dr. Erlinda Hong office since Dr. Maudie Mercury is unable to complete it. Pt requests call back.

## 2018-11-02 NOTE — Telephone Encounter (Signed)
Pt is requesting a call back from [place]: [phone]

## 2018-11-02 NOTE — Telephone Encounter (Signed)
I left a detailed message at the pts cell number stating we do not have any forms for SCAT in our office as I thought the pt was to bring these to her appt with Dr Maudie Mercury.

## 2018-11-02 NOTE — Telephone Encounter (Signed)
Patient called wanted to check and see if Dr.Xu has any paperwork from [employer] to determine disability,she stated they need this information on the letter head and they haven't received the paperwork. She is willing to provide more paperwork if needed due to her being unable to walk far. Patient also needs a letter stating she  cannot twist turn bend or lift more than 35 pounds. Patient is also requesting pain medication

## 2018-11-03 NOTE — Telephone Encounter (Signed)
I left a message for the pt to return my call. 

## 2018-11-03 NOTE — Telephone Encounter (Signed)
I do not. Haven't seen it.  We can do the letter

## 2018-11-03 NOTE — Telephone Encounter (Signed)
I have no paperwork. Do you have any of this?

## 2018-11-06 ENCOUNTER — Encounter (INDEPENDENT_AMBULATORY_CARE_PROVIDER_SITE_OTHER): Payer: Self-pay

## 2018-11-06 ENCOUNTER — Telehealth (INDEPENDENT_AMBULATORY_CARE_PROVIDER_SITE_OTHER): Payer: Self-pay | Admitting: Orthopaedic Surgery

## 2018-11-06 NOTE — Telephone Encounter (Signed)
Patient called needed her work note to specify no prolonged standing, please e-mail patient when note is complete. Patient will provide not to her doctor.     Please e-mail letter   [email or web address]

## 2018-11-06 NOTE — Telephone Encounter (Signed)
Called to advise her no answer LMOM.  we have not received forms.  Gave her our fax number and advised her letter ready for pick up at the front desk.

## 2018-11-07 ENCOUNTER — Telehealth (INDEPENDENT_AMBULATORY_CARE_PROVIDER_SITE_OTHER): Payer: Self-pay | Admitting: Orthopaedic Surgery

## 2018-11-07 ENCOUNTER — Other Ambulatory Visit: Payer: Self-pay | Admitting: Family Medicine

## 2018-11-07 MED ORDER — VENLAFAXINE HCL ER 75 MG PO CP24: ORAL_CAPSULE | ORAL | 0 refills | Status: DC

## 2018-11-07 MED ORDER — AMLODIPINE BESYLATE 5 MG PO TABS: 5.0000 mg | ORAL_TABLET | Freq: Every day | ORAL | 0 refills | Status: DC

## 2018-11-07 MED ORDER — FLUTICASONE PROPIONATE 50 MCG/ACT NA SUSP: 1.0000 | Freq: Every day | NASAL | 0 refills | Status: DC

## 2018-11-07 MED ORDER — TOLTERODINE TARTRATE ER 4 MG PO CP24: 4.0000 mg | ORAL_CAPSULE | Freq: Every day | ORAL | 0 refills | Status: DC

## 2018-11-07 MED ORDER — LISINOPRIL 20 MG PO TABS: 20.0000 mg | ORAL_TABLET | Freq: Every day | ORAL | 0 refills | Status: DC

## 2018-11-07 NOTE — Telephone Encounter (Signed)
Patient called asked if the letter can be emailed to her supervisor  [email or web address]. The number to contact patient is [phone]

## 2018-11-07 NOTE — Telephone Encounter (Signed)
Copied from [place] [id number]. Topic: Quick Communication - Rx Refill/Question >> Nov 07, 2018  7:49 AM Scherrie Gerlach wrote: Medication: lisinopril (PRINIVIL,ZESTRIL) 20 MG tablet amLODipine (NORVASC) 5 MG tablet  naproxen (NAPROSYN) 500 MG tablet  fluticasone (FLONASE) 50 MCG/ACT nasal spray tolterodine (DETROL LA) 4 MG 24 hr capsule venlafaxine XR (EFFEXOR-XR) 75 MG 24 hr capsule  [place], [place] - [address] [phone] (Phone) [phone] (Fax)

## 2018-11-07 NOTE — Telephone Encounter (Signed)
Refill requests for venlafaxine, tolterodine. Naproxen, lisinopril, fluticasone, and amlodipine; pharmacy notes dated 09/12/18 reads "patient needs an appointment"; last office visit 12/08/17; no upcoming appointments noted; attempted to contact pt; left message on voicemail [phone]; will route to office for final disposition. Requested Prescriptions  Pending Prescriptions Disp Refills  . amLODipine (NORVASC) 5 MG tablet 30 tablet 0    Sig: Take 1 tablet (5 mg total) by mouth daily.     Cardiovascular:  Calcium Channel Blockers Failed - 11/07/2018  9:00 AM      Failed - Valid encounter within last 6 months    Recent Outpatient Visits          11 months ago Diabetes mellitus without complication ([place])   Therapist, music at [facility], [place], DO   11 months ago Visit for preventive health examination   Therapist, music at [facility], [place], DO   11 months ago HYPERTENSION, [place] at [facility], [place], DO   1 year ago Essential hypertension   Therapist, music at [facility], [place], DO   1 year ago Dyslipidemia   Therapist, music at [facility], [place] R, DO             Passed - Last BP in normal range    BP Readings from Last 1 Encounters:  12/08/17 138/70       . fluticasone (FLONASE) 50 MCG/ACT nasal spray 16 g 0    Sig: Place 1 spray into both nostrils at bedtime.     Ear, Nose, and Throat: Nasal Preparations - Corticosteroids Passed - 11/07/2018  9:00 AM      Passed - Valid encounter within last 12 months    Recent Outpatient Visits          11 months ago Diabetes mellitus without complication ([place])   Therapist, music at [facility], [place], DO   11 months ago Visit for preventive health examination   Therapist, music at [facility], [place], DO   11 months ago HYPERTENSION, [place] at [facility], [place], DO   1 year ago Essential hypertension   Therapist, music at  [facility], [place], DO   1 year ago Dyslipidemia   Therapist, music at [facility], [place], DO           . lisinopril (PRINIVIL,ZESTRIL) 20 MG tablet 30 tablet 0    Sig: Take 1 tablet (20 mg total) by mouth daily.     Cardiovascular:  ACE Inhibitors Failed - 11/07/2018  9:00 AM      Failed - Cr in normal range and within 180 days    Creat  Date Value Ref Range Status  02/14/2014 0.94 0.50 - 1.10 mg/dL Final   Creatinine, Ser  Date Value Ref Range Status  11/28/2017 0.94 0.40 - 1.20 mg/dL Final         Failed - K in normal range and within 180 days    Potassium  Date Value Ref Range Status  11/28/2017 4.1 3.5 - 5.1 mEq/L Final         Failed - Valid encounter within last 6 months    Recent Outpatient Visits          11 months ago Diabetes mellitus without complication ([place])   Therapist, music at [facility], [place], DO   11 months ago Visit for preventive health examination   Therapist, music at [facility], [place], DO  11 months ago HYPERTENSION, [place] at [facility], [place], DO   1 year ago Essential hypertension   Therapist, music at [facility], [place], DO   1 year ago Dyslipidemia   Therapist, music at [facility], [place], DO             Passed - Patient is not pregnant      Passed - Last BP in normal range    BP Readings from Last 1 Encounters:  12/08/17 138/70       . naproxen (NAPROSYN) 500 MG tablet 14 tablet 0    Sig: 1 tablet 1-2 times daily as needed for pain     Analgesics:  NSAIDS Passed - 11/07/2018  9:00 AM      Passed - Cr in normal range and within 360 days    Creat  Date Value Ref Range Status  02/14/2014 0.94 0.50 - 1.10 mg/dL Final   Creatinine, Ser  Date Value Ref Range Status  11/28/2017 0.94 0.40 - 1.20 mg/dL Final         Passed - HGB in normal range and within 360 days    Hemoglobin  Date Value Ref Range Status  11/28/2017 13.0 12.0 - 15.0 g/dL Final          Passed - Patient is not pregnant      Passed - Valid encounter within last 12 months    Recent Outpatient Visits          11 months ago Diabetes mellitus without complication ([place])   Therapist, music at [facility], [place], DO   11 months ago Visit for preventive health examination   Therapist, music at [facility], [place], DO   11 months ago HYPERTENSION, [place] at [facility], [place], DO   1 year ago Essential hypertension   Therapist, music at [facility], [place], DO   1 year ago Dyslipidemia   Therapist, music at [facility], [place], DO           . tolterodine (DETROL LA) 4 MG 24 hr capsule 90 capsule 0    Sig: Take 1 capsule (4 mg total) by mouth daily.     Urology:  Bladder Agents Passed - 11/07/2018  9:00 AM      Passed - Valid encounter within last 12 months    Recent Outpatient Visits          11 months ago Diabetes mellitus without complication ([place])   Therapist, music at [facility], [place], DO   11 months ago Visit for preventive health examination   Therapist, music at [facility], [place], DO   11 months ago HYPERTENSION, [place] at [facility], [place], DO   1 year ago Essential hypertension   Therapist, music at [facility], [place], DO   1 year ago Dyslipidemia   Therapist, music at [facility], [place], DO           . venlafaxine XR (EFFEXOR-XR) 75 MG 24 hr capsule 30 capsule 0    Sig: TAKE 3 CAPSULES BY MOUTH EVERY MORNING WITH BREAKFAST     Psychiatry: Antidepressants - SNRI - desvenlafaxine & venlafaxine Failed - 11/07/2018  9:00 AM      Failed - LDL in normal range and within 360 days    LDL Cholesterol  Date Value Ref Range Status  11/28/2017 157 (H) 0 - 99  mg/dL Final         Failed - Total Cholesterol in normal range and within 360 days    Cholesterol  Date Value Ref Range Status  11/28/2017 233 (H) 0 - 200 mg/dL Final     Comment:    ATP III Classification       Desirable:  < 200 mg/dL               Borderline High:  200 - 239 mg/dL          High:  > = 240 mg/dL         Failed - Valid encounter within last 6 months    Recent Outpatient Visits          11 months ago Diabetes mellitus without complication ([place])   Therapist, music at [facility], [place], DO   11 months ago Visit for preventive health examination   Therapist, music at [facility], [place], DO   11 months ago HYPERTENSION, [place] at [facility], [place], DO   1 year ago Essential hypertension   Therapist, music at [facility], [place], DO   1 year ago Dyslipidemia   Therapist, music at [facility], [place], DO             Passed - Triglycerides in normal range and within 360 days    Triglycerides  Date Value Ref Range Status  11/28/2017 100.0 0.0 - 149.0 mg/dL Final    Comment:    Normal:  <150 mg/dLBorderline High:  150 - 199 mg/dL         Passed - Completed PHQ-2 or PHQ-9 in the last 360 days.      Passed - Last BP in normal range    BP Readings from Last 1 Encounters:  12/08/17 138/70

## 2018-11-08 NOTE — Telephone Encounter (Signed)
yes

## 2018-11-08 NOTE — Telephone Encounter (Signed)
Okay for work note? 

## 2018-11-09 ENCOUNTER — Encounter (INDEPENDENT_AMBULATORY_CARE_PROVIDER_SITE_OTHER): Payer: Self-pay

## 2018-11-09 ENCOUNTER — Telehealth (INDEPENDENT_AMBULATORY_CARE_PROVIDER_SITE_OTHER): Payer: Self-pay | Admitting: Orthopaedic Surgery

## 2018-11-09 NOTE — Telephone Encounter (Signed)
Called patient no answer LMOM.

## 2018-11-09 NOTE — Telephone Encounter (Signed)
Can you email this to patient for me please, I just wrote a new note for patient today

## 2018-11-09 NOTE — Telephone Encounter (Signed)
Emailed her note

## 2018-11-09 NOTE — Telephone Encounter (Signed)
Patient called wanting to know if you could email her work note to her at [email or web address].  She also wanted to know about her [employer] application.  Patient would like a call back.  CB#[phone].  Thank you.

## 2018-11-09 NOTE — Telephone Encounter (Signed)
EMAILED

## 2018-11-10 ENCOUNTER — Telehealth (INDEPENDENT_AMBULATORY_CARE_PROVIDER_SITE_OTHER): Payer: Self-pay | Admitting: Orthopaedic Surgery

## 2018-11-10 NOTE — Telephone Encounter (Signed)
See other messages

## 2018-11-10 NOTE — Telephone Encounter (Signed)
I have emailed it to all the emails she has left. Ive tried calling her multiple times and no answer. Emailed letter again today to her [email or web address]

## 2018-11-10 NOTE — Telephone Encounter (Signed)
Rachel Fields,  Patient called and stated having problems getting note to print for employer. Wants you to email to:  [email or web address]  Employer giving her a hard time and she is trying to get asap.

## 2018-11-13 NOTE — Telephone Encounter (Signed)
Called patient to advise we only received Part A and NOT Part B. Part A is for her to fill out and we are responsible for Part B. I will leave for her Part A at the front desk so she can fill out. She will bring [place] Part B.

## 2018-11-29 ENCOUNTER — Telehealth (INDEPENDENT_AMBULATORY_CARE_PROVIDER_SITE_OTHER): Payer: Self-pay | Admitting: Orthopaedic Surgery

## 2018-11-29 NOTE — Telephone Encounter (Signed)
15 lbs at most

## 2018-11-29 NOTE — Telephone Encounter (Signed)
SEE MESSAGE. PLEASE ADVISE.

## 2018-11-29 NOTE — Telephone Encounter (Signed)
Patient called this morning stating that she needs the letter from Dr. Erlinda Hong to state how much weight she can push because she works for [employer] and they are giving her a hard time about not pushing larger patients.  She requested that the note be emailed back to her at [email or web address].  CB#[phone].  Thank you.

## 2018-12-01 NOTE — Telephone Encounter (Signed)
Emailed letter

## 2018-12-11 ENCOUNTER — Other Ambulatory Visit: Payer: Self-pay | Admitting: Family Medicine

## 2018-12-12 ENCOUNTER — Telehealth: Payer: Self-pay | Admitting: *Deleted

## 2018-12-12 NOTE — Telephone Encounter (Signed)
Copied from [place] [id number]. Topic: General - Inquiry >> Dec 11, 2018 11:57 AM [name] wrote: Reason for CRM: Pt called and would like to speak with [name] regarding her venlafaxine XR (EFFEXOR-XR) 75 MG 24 hr capsule. Stated she was told to schedule appt for further refills but couldn't at the time due to not having insurance. She stated she now has insurance but declined to schedule appt with agent. Stated she only wanted to speak with [name]. Please advise. CB#[phone]

## 2018-12-12 NOTE — Telephone Encounter (Signed)
I left a detailed message for the pt stating a refill was sent to her pharmacy and she does need an appt.  I advised she return a call to the office if she needs further help.

## 2018-12-15 NOTE — Telephone Encounter (Signed)
Copied from [place] [id number]. Topic: General - Call Back - No Documentation >> Nov 03, 2018  4:34 PM Reyne Dumas L wrote: Reason for CRM:   Pt states she is returning a call from the office that was placed around 3pm today. Pt can be reached at [phone]

## 2018-12-21 ENCOUNTER — Ambulatory Visit: Payer: BLUE CROSS/BLUE SHIELD | Admitting: Family Medicine

## 2019-01-09 ENCOUNTER — Other Ambulatory Visit: Payer: Self-pay | Admitting: Family Medicine

## 2019-01-11 ENCOUNTER — Ambulatory Visit (INDEPENDENT_AMBULATORY_CARE_PROVIDER_SITE_OTHER): Payer: BLUE CROSS/BLUE SHIELD | Admitting: Family Medicine

## 2019-01-11 ENCOUNTER — Telehealth: Payer: Self-pay | Admitting: *Deleted

## 2019-01-11 ENCOUNTER — Other Ambulatory Visit: Payer: Self-pay

## 2019-01-11 DIAGNOSIS — Z5329 Procedure and treatment not carried out because of patient's decision for other reasons: Secondary | ICD-10-CM

## 2019-01-11 DIAGNOSIS — Z91199 Patient's noncompliance with other medical treatment and regimen due to unspecified reason: Secondary | ICD-10-CM

## 2019-01-11 NOTE — Progress Notes (Signed)
NO show for visit.

## 2019-01-11 NOTE — Telephone Encounter (Signed)
I left a detailed message at the pts cell number to call back and reschedule an appt.

## 2019-01-11 NOTE — Telephone Encounter (Signed)
-----   Message from Lucretia Kern, DO sent at 01/11/2019 11:54 AM EDT ----- Ok. Thanks! If she calls back, or you speak with her in person please help her to set up another visit time next week. She will need a visit for refills. ----- Message ----- From: Agnes Lawrence, CMA Sent: 01/11/2019  11:51 AM EDT To: Lucretia Kern, DO  I tried calling and left a detailed message I was checking to see if she was logged in. [name]  ----- Message ----- From: Lucretia Kern, DO Sent: 01/11/2019  11:45 AM EDT To: Agnes Lawrence, CMA  Hi [name],  I tried to connect with Simon Rhein multiple times. Perhaps you are speaking with her? I could just call her now (please ask her to pick up the phone if dose not recognize number or it is blocked) or we could set up another time for a video visit next week? Thank you.

## 2019-01-17 ENCOUNTER — Telehealth (INDEPENDENT_AMBULATORY_CARE_PROVIDER_SITE_OTHER): Payer: Self-pay | Admitting: Orthopaedic Surgery

## 2019-01-17 NOTE — Telephone Encounter (Signed)
Patient called wanting to know if Dr. Erlinda Hong received a fax from [facility] for her.  CB#[phone].  Thank you.

## 2019-01-17 NOTE — Telephone Encounter (Signed)
Called her and advised her as of 1:52pm I have nothing in my box and I did give her our fax number [phone]. They will refax.

## 2019-01-18 ENCOUNTER — Emergency Department (HOSPITAL_COMMUNITY): Payer: BLUE CROSS/BLUE SHIELD

## 2019-01-18 ENCOUNTER — Emergency Department (HOSPITAL_COMMUNITY)
Admission: EM | Admit: 2019-01-18 | Discharge: 2019-01-18 | Disposition: A | Discharge status: Home / Self Care | Payer: BLUE CROSS/BLUE SHIELD | Attending: Emergency Medicine | Admitting: Emergency Medicine

## 2019-01-18 ENCOUNTER — Encounter (HOSPITAL_COMMUNITY): Payer: Self-pay

## 2019-01-18 ENCOUNTER — Other Ambulatory Visit: Payer: Self-pay

## 2019-01-18 DIAGNOSIS — E119 Type 2 diabetes mellitus without complications: Secondary | ICD-10-CM | POA: Diagnosis not present

## 2019-01-18 DIAGNOSIS — Z79899 Other long term (current) drug therapy: Secondary | ICD-10-CM | POA: Diagnosis not present

## 2019-01-18 DIAGNOSIS — I10 Essential (primary) hypertension: Secondary | ICD-10-CM | POA: Insufficient documentation

## 2019-01-18 DIAGNOSIS — R0789 Other chest pain: Secondary | ICD-10-CM

## 2019-01-18 DIAGNOSIS — R079 Chest pain, unspecified: Secondary | ICD-10-CM | POA: Diagnosis not present

## 2019-01-18 LAB — CBC WITH DIFFERENTIAL/PLATELET
Abs Immature Granulocytes: 0.02 10*3/uL (ref 0.00–0.07)
Basophils Absolute: 0 10*3/uL (ref 0.0–0.1)
Basophils Relative: 0 %
Eosinophils Absolute: 0.2 10*3/uL (ref 0.0–0.5)
Eosinophils Relative: 3 %
HCT: 38 % (ref 36.0–46.0)
Hemoglobin: 11.8 g/dL — ABNORMAL LOW (ref 12.0–15.0)
Immature Granulocytes: 0 %
Lymphocytes Relative: 25 %
Lymphs Abs: 1.7 10*3/uL (ref 0.7–4.0)
MCH: 24.8 pg — ABNORMAL LOW (ref 26.0–34.0)
MCHC: 31.1 g/dL (ref 30.0–36.0)
MCV: 80 fL (ref 80.0–100.0)
Monocytes Absolute: 0.7 10*3/uL (ref 0.1–1.0)
Monocytes Relative: 10 %
Neutro Abs: 4.2 10*3/uL (ref 1.7–7.7)
Neutrophils Relative %: 62 %
Platelets: 248 10*3/uL (ref 150–400)
RBC: 4.75 MIL/uL (ref 3.87–5.11)
RDW: 14.3 % (ref 11.5–15.5)
WBC: 6.8 10*3/uL (ref 4.0–10.5)
nRBC: 0 % (ref 0.0–0.2)

## 2019-01-18 LAB — BASIC METABOLIC PANEL
Anion gap: 10 (ref 5–15)
BUN: 15 mg/dL (ref 8–23)
CO2: 27 mmol/L (ref 22–32)
Calcium: 9.4 mg/dL (ref 8.9–10.3)
Chloride: 105 mmol/L (ref 98–111)
Creatinine, Ser: 0.94 mg/dL (ref 0.44–1.00)
GFR calc Af Amer: >60 mL/min (ref >60)
GFR calc non Af Amer: >60 mL/min (ref >60)
Glucose, Bld: 170 mg/dL — ABNORMAL HIGH (ref 70–99)
Potassium: 3.5 mmol/L (ref 3.5–5.1)
Sodium: 142 mmol/L (ref 135–145)

## 2019-01-18 LAB — TROPONIN I: Troponin I: <0.03 ng/mL (ref <0.03)

## 2019-01-18 MED ORDER — KETOROLAC TROMETHAMINE 15 MG/ML IJ SOLN
15.0000 mg | Freq: Once | INTRAMUSCULAR | Status: AC
  Administered 2019-01-18: 15 mg via INTRAVENOUS
  Filled 2019-01-18: qty 1

## 2019-01-18 MED ORDER — NAPROXEN 500 MG PO TABS: 500.0000 mg | ORAL_TABLET | Freq: Two times a day (BID) | ORAL | 0 refills | Status: AC

## 2019-01-18 NOTE — ED Provider Notes (Signed)
[facility] EMERGENCY DEPARTMENT Provider Note   CSN: [id number] Arrival date & time: 01/18/19  0754    History   Chief Complaint No chief complaint on file.   HPI Rachel Fields is a 63 y.o. female.     63 year old African-American female past medical history of diabetes, hypertension, hyperlipidemia, obesity who presents the emergency department for chest pain.  Reports that the pain is left-sided.  It started while she was watching TV last night.  Reports that it is worse when she turns and twists as well as when she moves her arm.  Describes the pain as sharp and radiates around her side.  Denies any shortness of breath or trouble breathing.  She endorses a cough over the last couple of weeks but attributes this to seasonal allergies.  She reports history of the same type of pain several years ago that was related to pneumonia..  She does not smoke or drink alcohol.     Past Medical History:  Diagnosis Date  . Allergy   . Anxiety   . Anxiety and depression   . Arthritis   . Depression   . HTN (hypertension)   . Hx of iron deficiency anemia    reports [place] trait; reports on iron her whole life  . Low back pain    chronic, reports hx DDD treated by chiropractor  . No blood products    Jehovah's Witness  . OAB (overactive bladder)   . Seasonal allergies   . Sickle cell trait ([place])   . Uterine fibroid     Patient Active Problem List   Diagnosis Date Noted  . Unilateral primary osteoarthritis, right hip 09/27/2018  . Diabetes mellitus without complication ([place]) 94/85/4627  . Hypertension associated with diabetes ([place]) 12/08/2017  . BMI 32.0-32.9,adult 12/08/2017  . Seasonal allergies 01/21/2015  . OAB (overactive bladder) 01/21/2015  . Hyperlipidemia associated with type 2 diabetes mellitus ([place]) 02/14/2014  . Obesity 12/19/2009  . LOW BACK PAIN 03/18/2008  . DEPRESSION/ANXIETY 06/15/2007  . HYPERTENSION, BENIGN 06/15/2007    Past Surgical History:   Procedure Laterality Date  . BUNIONECTOMY Bilateral 1987  . CESAREAN SECTION    . Teeth extracted   01/14/2017  . TUBAL LIGATION    . TUMOR REMOVAL     behind left eye     OB History   No obstetric history on file.      Home Medications    Prior to Admission medications   Medication Sig Start Date End Date Taking? Authorizing Provider  amLODipine (NORVASC) 5 MG tablet TAKE 1 TABLET BY MOUTH ONCE DAILY (LAST  REFILL  PATIENT  NEEDS  AN  APPOINTMENT) 01/09/19   Lucretia Kern, DO  calcium-vitamin D (CALCIUM + D) 250-125 MG-UNIT per tablet Take 1 tablet by mouth daily. 02/14/14   Robbie Lis, MD  cyclobenzaprine (FLEXERIL) 5 MG tablet Take 1 tablet (5 mg total) by mouth 2 (two) times daily as needed for muscle spasms. 01/04/17   Lucretia Kern, DO  diclofenac sodium (VOLTAREN) 1 % GEL Apply 2 g topically 4 (four) times daily. 12/19/17   Leandrew Koyanagi, MD  Fe Fum-FA-B Cmp-C-Zn-Mg-Mn-Cu (HEMOCYTE PLUS) 106-1 MG CAPS Take 1 capsule by mouth daily. 02/14/14   Robbie Lis, MD  fluticasone [facility]) 50 MCG/ACT nasal spray Place 1 spray into both nostrils at bedtime. 11/07/18   Lucretia Kern, DO  lisinopril (PRINIVIL,ZESTRIL) 20 MG tablet Take 1 tablet (20 mg total) by mouth daily. 11/07/18  Lucretia Kern, DO  naproxen (NAPROSYN) 500 MG tablet Take 1 tablet (500 mg total) by mouth 2 (two) times daily for 7 days. 01/18/19 01/25/19  Alveria Apley, PA-C  OVER THE COUNTER MEDICATION Take 2 tablets by mouth daily. Biotin 5027mcg    [provider]  rosuvastatin (CRESTOR) 10 MG tablet Take 1 tablet (10 mg total) by mouth daily. 12/08/17   Lucretia Kern, DO  tolterodine (DETROL LA) 4 MG 24 hr capsule Take 1 capsule (4 mg total) by mouth daily. 11/07/18   Lucretia Kern, DO  venlafaxine XR (EFFEXOR-XR) 75 MG 24 hr capsule TAKE 3 CAPSULES BY MOUTH IN THE MORNING WITH BREAKFAST (PATIENT  NEEDS  AN  APPOINTMENT) 12/12/18   Lucretia Kern, DO    Family History Family History  Problem Relation Age of  Onset  . Diabetes Father   . Hypertension Father   . Dementia Father        senile  . Other Mother        smoker  . Lung cancer Mother        2010  . Bipolar disorder Sister   . Schizophrenia Sister   . Schizophrenia Sister   . Bipolar disorder Sister   . Schizophrenia Sister   . Bipolar disorder Sister   . Bipolar disorder Daughter   . Colon cancer Neg Hx     Social History Social History   Tobacco Use  . Smoking status: Never Smoker  . Smokeless tobacco: Never Used  Substance Use Topics  . Alcohol use: Yes    Comment: socially - rare; 2 drinks a few times per month  . Drug use: No     Allergies   Patient has no known allergies.   Review of Systems Review of Systems  Constitutional: Negative for chills and fever.  HENT: Negative for ear pain, rhinorrhea, sinus pain and sore throat.   Eyes: Negative for pain and visual disturbance.  Respiratory: Positive for cough. Negative for apnea, chest tightness, shortness of breath, wheezing and stridor.   Cardiovascular: Negative for chest pain, palpitations and leg swelling.  Gastrointestinal: Negative for abdominal distention, abdominal pain, diarrhea, nausea and vomiting.  Genitourinary: Negative for dysuria and hematuria.  Musculoskeletal: Negative for arthralgias and back pain.  Skin: Negative for color change and rash.  Neurological: Negative for dizziness, seizures and syncope.  All other systems reviewed and are negative.    Physical Exam Updated Vital Signs BP (!) 147/132   Pulse 86   Temp 98.3 F (36.8 C) (Oral) Comment: Simultaneous filing. User may not have seen previous data. Comment (Src): Simultaneous filing. User may not have seen previous data.  Resp 14   Ht 5\' 7"  (1.702 m)   Wt 93.4 kg   SpO2 100%   BMI 32.26 kg/m   Physical Exam Vitals signs and nursing note reviewed.  Constitutional:      General: She is not in acute distress.    Appearance: Normal appearance. She is obese. She is not  ill-appearing, toxic-appearing or diaphoretic.  HENT:     Head: Normocephalic.     Nose: Nose normal.     Mouth/Throat:     Mouth: Mucous membranes are moist.  Eyes:     Conjunctiva/sclera: Conjunctivae normal.  Cardiovascular:     Rate and Rhythm: Normal rate and regular rhythm.  Pulmonary:     Effort: Pulmonary effort is normal.     Breath sounds: Normal breath sounds.  Chest:  Abdominal:     General: Abdomen is flat.  Skin:    General: Skin is dry.  Neurological:     Mental Status: She is alert.  Psychiatric:        Mood and Affect: Mood normal.     Comments: Happy, joking, laughing      ED Treatments / Results  Labs (all labs ordered are listed, but only abnormal results are displayed) Labs Reviewed  BASIC METABOLIC PANEL - Abnormal; Notable for the following components:      Result Value   Glucose, Bld 170 (*)    All other components within normal limits  CBC WITH DIFFERENTIAL/PLATELET - Abnormal; Notable for the following components:   Hemoglobin 11.8 (*)    MCH 24.8 (*)    All other components within normal limits  TROPONIN I    EKG EKG Interpretation  Date/Time:  Thursday January 18 2019 08:03:44 EDT Ventricular Rate:  96 PR Interval:    QRS Duration: 80 QT Interval:  351 QTC Calculation: 444 R Axis:   43 Text Interpretation:  Sinus rhythm Consider left ventricular hypertrophy no change from previous Confirmed by Charlesetta Shanks [phone]) on 01/18/2019 8:58:23 AM   Radiology Dg Chest Portable 1 View  Result Date: 01/18/2019 CLINICAL DATA:  Left chest pain beginning last night EXAM: PORTABLE CHEST 1 VIEW COMPARISON:  08/27/2016 FINDINGS: Normal heart size and mediastinal contours. No acute infiltrate or edema. No effusion or pneumothorax. No acute osseous findings. IMPRESSION: Negative chest Electronically Signed   By: Monte Fantasia M.D.   On: 01/18/2019 08:47    Procedures Procedures (including critical care time)  Medications Ordered in ED  Medications  ketorolac (TORADOL) 15 MG/ML injection 15 mg (15 mg Intravenous Given 01/18/19 0844)     Initial Impression / Assessment and Plan / ED Course  I have reviewed the triage vital signs and the nursing notes.  Pertinent labs & imaging results that were available during my care of the patient were reviewed by me and considered in my medical decision making (see chart for details).  Clinical Course as of Jan 17 949  Thu Jan 18, 2019  0942 Chest pain is very atypical and is more consistent with muscle strain/pain given the reproducibility and pain with movement. Chest xray, labs, troponin, EKG all unremarkable. Heart score is low risk.  Patient reports that she is significantly improved with Toradol.  She has ready to go home.  We will send her home with prescription for naproxen and have her follow-up with her primary care doctor   [KM]    Clinical Course User Index [KM] Alveria Apley, PA-C         Final Clinical Impressions(s) / ED Diagnoses   Final diagnoses:  Atypical chest pain  Chest wall pain    ED Discharge Orders         Ordered    naproxen (NAPROSYN) 500 MG tablet  2 times daily     01/18/19 0950           Alveria Apley, PA-C 01/18/19 9211    Charlesetta Shanks, MD 01/25/19 [phone]

## 2019-01-18 NOTE — ED Triage Notes (Addendum)
Pt stated chest pain began last night & this morning the Lt side of her chest was to painful to move. This pain radiates to her Lt side, it is tender to touch & does hurt when she takes a deep breath.

## 2019-01-18 NOTE — Discharge Instructions (Signed)
Thank you for allowing me to care for you today. Please return to the emergency department if you have new or worsening symptoms. Take your medications as instructed.  ° °

## 2019-01-22 ENCOUNTER — Other Ambulatory Visit: Payer: Self-pay

## 2019-01-22 ENCOUNTER — Ambulatory Visit (INDEPENDENT_AMBULATORY_CARE_PROVIDER_SITE_OTHER): Payer: BLUE CROSS/BLUE SHIELD | Admitting: Family Medicine

## 2019-01-22 ENCOUNTER — Encounter: Payer: Self-pay | Admitting: Family Medicine

## 2019-01-22 DIAGNOSIS — N3281 Overactive bladder: Secondary | ICD-10-CM

## 2019-01-22 DIAGNOSIS — F341 Dysthymic disorder: Secondary | ICD-10-CM

## 2019-01-22 DIAGNOSIS — J302 Other seasonal allergic rhinitis: Secondary | ICD-10-CM

## 2019-01-22 DIAGNOSIS — E119 Type 2 diabetes mellitus without complications: Secondary | ICD-10-CM | POA: Diagnosis not present

## 2019-01-22 DIAGNOSIS — I10 Essential (primary) hypertension: Secondary | ICD-10-CM

## 2019-01-22 DIAGNOSIS — E782 Mixed hyperlipidemia: Secondary | ICD-10-CM

## 2019-01-22 MED ORDER — ROSUVASTATIN CALCIUM 10 MG PO TABS: 10.0000 mg | ORAL_TABLET | Freq: Every day | ORAL | 2 refills | Status: DC

## 2019-01-22 MED ORDER — AMLODIPINE BESYLATE 5 MG PO TABS: ORAL_TABLET | ORAL | 3 refills | Status: DC

## 2019-01-22 MED ORDER — TOLTERODINE TARTRATE ER 4 MG PO CP24: 4.0000 mg | ORAL_CAPSULE | Freq: Every day | ORAL | 3 refills | Status: DC

## 2019-01-22 MED ORDER — LISINOPRIL 20 MG PO TABS: 20.0000 mg | ORAL_TABLET | Freq: Every day | ORAL | 3 refills | Status: DC

## 2019-01-22 MED ORDER — FLUTICASONE PROPIONATE 50 MCG/ACT NA SUSP: 1.0000 | Freq: Every day | NASAL | 3 refills | Status: DC

## 2019-01-22 MED ORDER — VENLAFAXINE HCL ER 75 MG PO CP24: ORAL_CAPSULE | ORAL | 3 refills | Status: DC

## 2019-01-22 NOTE — Progress Notes (Signed)
Virtual Visit via Video Note  I connected with Rachel Fields on 01/22/19 at  9:00 AM EDT by a video enabled telemedicine application and verified that I am speaking with the correct person using two identifiers.  Location patient: home Location provider: home office Persons participating in the virtual visit: patient, provider  I discussed the limitations of evaluation and management by telemedicine and the availability of in person appointments. The patient expressed understanding and agreed to proceed.   HPI: Pt following up on chronic conditions and TOC, previously seen by Dr. Maudie Mercury.  Has not had recent f/u.  Pt does not provide much detail when asked questions.  DM II:  States did not know she had diabetes/was never told.  Pt advised during OFV 12/08/17 she was informed.   States eating everything.  Not exercising.  "slipped disc in back" being followed by Ortho  HTN: uses a wrist cuff at home 140/90.  States checks at work, but states it is "normal" less than 150/90.  States cooks regular food.  Uses ham hocks to season greens.  Anxiety and depression: states mood is good.  Has been on effexor XL 270 mg x 30 yrs.  Wakes up at 3 am.  States snores, attributes to allergies.  Requesting refill on flonase.  Never had a sleep study.  OAB: taking detrol. Per chart review has been out x months.  Having to urinate q 2 hours.  Drinking mostly water, has one cup of coffee q am.   ROS: See pertinent positives and negatives per HPI.  Past Medical History:  Diagnosis Date  . Allergy   . Anxiety   . Anxiety and depression   . Arthritis   . Depression   . HTN (hypertension)   . Hx of iron deficiency anemia    reports [place] trait; reports on iron her whole life  . Low back pain    chronic, reports hx DDD treated by chiropractor  . No blood products    Jehovah's Witness  . OAB (overactive bladder)   . Seasonal allergies   . Sickle cell trait ([place])   . Uterine fibroid     Past Surgical  History:  Procedure Laterality Date  . BUNIONECTOMY Bilateral 1987  . CESAREAN SECTION    . Teeth extracted   01/14/2017  . TUBAL LIGATION    . TUMOR REMOVAL     behind left eye    Family History  Problem Relation Age of Onset  . Diabetes Father   . Hypertension Father   . Dementia Father        senile  . Other Mother        smoker  . Lung cancer Mother        2010  . Bipolar disorder Sister   . Schizophrenia Sister   . Schizophrenia Sister   . Bipolar disorder Sister   . Schizophrenia Sister   . Bipolar disorder Sister   . Bipolar disorder Daughter   . Colon cancer Neg Hx     SOCIAL HX: working as a Quarry manager at [employer].     Current Outpatient Medications:  .  amLODipine (NORVASC) 5 MG tablet, TAKE 1 TABLET BY MOUTH ONCE DAILY (LAST  REFILL  PATIENT  NEEDS  AN  APPOINTMENT), Disp: 30 tablet, Rfl: 0 .  calcium-vitamin D (CALCIUM + D) 250-125 MG-UNIT per tablet, Take 1 tablet by mouth daily., Disp: 30 tablet, Rfl: 3 .  cyclobenzaprine (FLEXERIL) 5 MG tablet, Take 1  tablet (5 mg total) by mouth 2 (two) times daily as needed for muscle spasms., Disp: 20 tablet, Rfl: 0 .  diclofenac sodium (VOLTAREN) 1 % GEL, Apply 2 g topically 4 (four) times daily., Disp: 1 Tube, Rfl: 5 .  Fe Fum-FA-B Cmp-C-Zn-Mg-Mn-Cu (HEMOCYTE PLUS) 106-1 MG CAPS, Take 1 capsule by mouth daily., Disp: 30 each, Rfl: 3 .  fluticasone (FLONASE) 50 MCG/ACT nasal spray, Place 1 spray into both nostrils at bedtime., Disp: 16 g, Rfl: 0 .  lisinopril (PRINIVIL,ZESTRIL) 20 MG tablet, Take 1 tablet (20 mg total) by mouth daily., Disp: 30 tablet, Rfl: 0 .  naproxen (NAPROSYN) 500 MG tablet, Take 1 tablet (500 mg total) by mouth 2 (two) times daily for 7 days., Disp: 14 tablet, Rfl: 0 .  OVER THE COUNTER MEDICATION, Take 2 tablets by mouth daily. Biotin 5070mcg, Disp: , Rfl:  .  rosuvastatin (CRESTOR) 10 MG tablet, Take 1 tablet (10 mg total) by mouth daily., Disp: 90 tablet, Rfl: 3 .  tolterodine (DETROL LA) 4 MG  24 hr capsule, Take 1 capsule (4 mg total) by mouth daily., Disp: 30 capsule, Rfl: 0 .  venlafaxine XR (EFFEXOR-XR) 75 MG 24 hr capsule, TAKE 3 CAPSULES BY MOUTH IN THE MORNING WITH BREAKFAST (PATIENT  NEEDS  AN  APPOINTMENT), Disp: 90 capsule, Rfl: 0  EXAM:  VITALS per patient if applicable:  RR between 12-20 bpm  GENERAL: alert, oriented, appears well and in no acute distress  HEENT: atraumatic, conjunctiva clear, no obvious abnormalities on inspection of external nose and ears  NECK: normal movements of the head and neck  LUNGS: on inspection no signs of respiratory distress, breathing rate appears normal, no obvious gross SOB, gasping or wheezing  CV: no obvious cyanosis  MS: moves all visible extremities without noticeable abnormality  PSYCH/NEURO: pleasant and cooperative, no obvious depression or anxiety, speech and thought processing grossly intact  ASSESSMENT AND PLAN:  Discussed the following assessment and plan:  Essential hypertension - Plan: amLODipine (NORVASC) 5 MG tablet, lisinopril (PRINIVIL,ZESTRIL) 20 MG tablet  Diabetes mellitus without complication (HCC) -hgb W0J 6.9% on 11/28/17 -pt strongly encouraged to start lifestyle modifications -discussed foods to avoid/limit. -advised to f/u in 1 month -will need hgb A1C rechecked.  If still elevated will start metformin 500 mg daily.  OAB (overactive bladder)  - Plan: tolterodine (DETROL LA) 4 MG 24 hr capsule  Seasonal allergies  - Plan: fluticasone (FLONASE) 50 MCG/ACT nasal spray  DEPRESSION/ANXIETY  -stable - Plan: venlafaxine XR (EFFEXOR-XR) 75 MG 24 hr capsule  Mixed hyperlipidemia  -total cholesterol 233 and LDL 157 on 11/28/17 -discussed lifestyle modifications -will need lipid panel - Plan: rosuvastatin (CRESTOR) 10 MG tablet  F/u in 1 month, sooner if needed.  Compliance strongly encouraged.   I discussed the assessment and treatment plan with the patient. The patient was provided an  opportunity to ask questions and all were answered. The patient agreed with the plan and demonstrated an understanding of the instructions.   The patient was advised to call back or seek an in-person evaluation if the symptoms worsen or if the condition fails to improve as anticipated.   Billie Ruddy, MD

## 2019-01-25 ENCOUNTER — Telehealth (INDEPENDENT_AMBULATORY_CARE_PROVIDER_SITE_OTHER): Payer: Self-pay | Admitting: Orthopaedic Surgery

## 2019-01-25 NOTE — Telephone Encounter (Signed)
Patient called wanting to know if Dr. Erlinda Hong received a fax from [place] for her.  CB#[phone].  Thank you.

## 2019-01-26 NOTE — Telephone Encounter (Signed)
I have not received any forms. I will check back with her Monday. If I still dont receive them I will cal patient.   Patient Would like a back brace and would like something for pain. Taking Naproxen and is not helping.    CB [phone]

## 2019-01-26 NOTE — Telephone Encounter (Signed)
Lumbar corset. Tramadol #30

## 2019-01-29 MED ORDER — ACETAMINOPHEN-CODEINE #3 300-30 MG PO TABS: 1.0000 | ORAL_TABLET | Freq: Every day | ORAL | 0 refills | Status: AC | PRN

## 2019-01-29 NOTE — Telephone Encounter (Signed)
Tramadol did not help. Naproxen is not helping either. Would like something else.      She will have them refax paperwork to fax number [phone]) [phone].

## 2019-02-08 ENCOUNTER — Encounter: Payer: Self-pay | Admitting: Family Medicine

## 2019-02-08 ENCOUNTER — Ambulatory Visit (INDEPENDENT_AMBULATORY_CARE_PROVIDER_SITE_OTHER): Payer: BLUE CROSS/BLUE SHIELD | Admitting: Family Medicine

## 2019-02-08 ENCOUNTER — Other Ambulatory Visit: Payer: Self-pay

## 2019-02-08 DIAGNOSIS — Z20828 Contact with and (suspected) exposure to other viral communicable diseases: Secondary | ICD-10-CM | POA: Diagnosis not present

## 2019-02-08 DIAGNOSIS — Z1211 Encounter for screening for malignant neoplasm of colon: Secondary | ICD-10-CM | POA: Diagnosis not present

## 2019-02-08 DIAGNOSIS — Z20822 Contact with and (suspected) exposure to covid-19: Secondary | ICD-10-CM

## 2019-02-08 NOTE — Progress Notes (Signed)
Virtual Visit via Telephone Note  I connected with Rachel Fields on 02/08/19 at  1:30 PM EDT by telephone and verified that I am speaking with the correct person using two identifiers.   I discussed the limitations, risks, security and privacy concerns of performing an evaluation and management service by telephone and the availability of in person appointments. I also discussed with the patient that there may be a patient responsible charge related to this service. The patient expressed understanding and agreed to proceed.  Location patient: home Location provider: work or home office Participants present for the call: patient, provider Patient did not have a visit in the prior 7 days to address this/these issue(s).   History of Present Illness: Pt uses public transportation to get to and from work.  Pt states she was informed that one of the [employer] bus drivers tested positive for COVID-19.  Pt is not sure if it was the driver of her bus or not.  Pt was advised that she could not return to work until mid May unless she gets tested.  Pt is asymptomatic.  She states she feels fine.  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  States wants to make sure it is in her chart that she does not wish to receive any blood products.  States will bring her "blood card" to make sure it is in the chart.  Pt also mentions she is finally ready to have a colonoscopy.  Also states will need a derm referral at some point for an area on her face that itches and turns dark.    Observations/Objective: Patient sounds cheerful and well on the phone. I do not appreciate any SOB. Speech and thought processing are grossly intact. Patient reported vitals:  Assessment and Plan: Exposure to Covid-19 Virus -currently asymptomatic.  Discussed current testing policies in the area. -given possible exposure on public transportation, pt advised to self quarantine x 14 days. -pt to call clinic in 2 wks for a note to return  to work. -pt given precautions if develops symptoms.  Colon cancer screening  - Plan: Ambulatory referral to Gastroenterology    Follow Up Instructions: F/u prn. Pt to call office in 2 wks for a note to return to work.  I did not refer this patient for an OV in the next 24 hours for this/these issue(s).  I discussed the assessment and treatment plan with the patient. The patient was provided an opportunity to ask questions and all were answered. The patient agreed with the plan and demonstrated an understanding of the instructions.   The patient was advised to call back or seek an in-person evaluation if the symptoms worsen or if the condition fails to improve as anticipated.  I provided  13 minutes of non-face-to-face time during this encounter.   Billie Ruddy, MD

## 2019-02-13 ENCOUNTER — Telehealth: Payer: Self-pay

## 2019-02-13 NOTE — Telephone Encounter (Signed)
Faxed SCAT Info to [phone]. Sent original to scan center.

## 2019-05-25 ENCOUNTER — Telehealth: Payer: Self-pay | Admitting: Family Medicine

## 2019-05-25 NOTE — Telephone Encounter (Signed)
Dr. Banks please advise  

## 2019-05-25 NOTE — Telephone Encounter (Signed)
Pt called to request refill for Tramadol. She did not know the dosage but stated she used to receive the rx from Dr. Maudie Mercury. Not on current med list. Please advise.  [place] ([place]), [place] - [address] [phone] (Phone) [phone] (Fax)

## 2019-05-25 NOTE — Telephone Encounter (Signed)
Per chart review does not appear that Dr. Maudie Mercury ever prescribed tramadol for pt. Last rx for tramadol was 2013 for acute injury.

## 2019-06-11 ENCOUNTER — Telehealth: Payer: Self-pay | Admitting: Family Medicine

## 2019-06-11 NOTE — Telephone Encounter (Signed)
Pt calling and stating she want a different pain medication. The one she is taking isn't doing anything for her.   Can she try Oxycodone

## 2019-06-12 NOTE — Telephone Encounter (Signed)
LVM for pt to call office and schedule a virtual visit with Dr Volanda Napoleon this afternoon

## 2019-06-15 ENCOUNTER — Other Ambulatory Visit: Payer: Self-pay

## 2019-06-15 DIAGNOSIS — Z20822 Contact with and (suspected) exposure to covid-19: Secondary | ICD-10-CM

## 2019-06-17 LAB — NOVEL CORONAVIRUS, NAA: SARS-CoV-2, NAA: NOT DETECTED

## 2019-06-26 NOTE — Telephone Encounter (Signed)
LVM for pt with details for her Tramadol request

## 2019-06-27 ENCOUNTER — Encounter

## 2019-06-27 ENCOUNTER — Encounter: Payer: BC Managed Care – PPO | Admitting: Family Medicine

## 2019-06-30 ENCOUNTER — Other Ambulatory Visit: Payer: Self-pay | Admitting: Family Medicine

## 2019-06-30 DIAGNOSIS — F341 Dysthymic disorder: Secondary | ICD-10-CM

## 2019-06-30 DIAGNOSIS — I10 Essential (primary) hypertension: Secondary | ICD-10-CM

## 2019-07-02 ENCOUNTER — Other Ambulatory Visit: Payer: Self-pay | Admitting: Family Medicine

## 2019-07-02 DIAGNOSIS — I10 Essential (primary) hypertension: Secondary | ICD-10-CM

## 2019-07-02 DIAGNOSIS — F341 Dysthymic disorder: Secondary | ICD-10-CM

## 2019-07-02 DIAGNOSIS — N3281 Overactive bladder: Secondary | ICD-10-CM

## 2019-07-02 MED ORDER — TOLTERODINE TARTRATE ER 4 MG PO CP24: 4.0000 mg | ORAL_CAPSULE | Freq: Every day | ORAL | 3 refills | Status: DC

## 2019-07-02 MED ORDER — LISINOPRIL 20 MG PO TABS: 20.0000 mg | ORAL_TABLET | Freq: Every day | ORAL | 0 refills | Status: DC

## 2019-07-02 MED ORDER — VENLAFAXINE HCL ER 75 MG PO CP24: ORAL_CAPSULE | ORAL | 0 refills | Status: DC

## 2019-07-02 NOTE — Telephone Encounter (Signed)
Patient would like a refill on her venlafaxine XR (EFFEXOR-XR) 75 MG 24 hr capsule medication and have it sent to her preferred pharmacy [facility] on [address].  She would also like a return call when this is done.

## 2019-07-02 NOTE — Addendum Note (Signed)
Addended by: Jefferson Fuel on: 07/02/2019 10:04 AM   Modules accepted: Orders

## 2019-07-02 NOTE — Telephone Encounter (Addendum)
Pt called back to request additional refill  amLODipine (NORVASC) 5 MG tablet (transmission failed 9/19)  Fe Fum-FA-B Cmp-C-Zn-Mg-Mn-Cu (HEMOCYTE PLUS) 106-1 MG CAPS  tolterodine (DETROL LA) 4 MG 24 hr capsule  lisinopril (PRINIVIL,ZESTRIL) 20 MG tablet  [place] ([place]), [place] - [address] [phone] (Phone) [phone] (Fax)

## 2019-07-02 NOTE — Telephone Encounter (Signed)
Requested medication (s) are due for refill today - unknown  Requested medication (s) are on the active medication list - yes  Future visit scheduled -no  Last refill: - historical mediaction  Notes to clinic: Patient is requesting refill of historical medication  Requested Prescriptions  Pending Prescriptions Disp Refills   Fe Fum-FA-B Cmp-C-Zn-Mg-Mn-Cu (HEMOCYTE PLUS) 106-1 MG CAPS      Sig: Take 1 capsule by mouth daily.     Off-Protocol Failed - 07/02/2019 10:04 AM      Failed - Medication not assigned to a protocol, review manually.      Passed - Valid encounter within last 12 months    Recent Outpatient Visits          4 months ago Exposure to [employer] at [place], MD   5 months ago Essential hypertension   Therapist, music at [place], MD   5 months ago No-show for appointment   [employer] at [facility], [place], DO   1 year ago Diabetes mellitus without complication ([place])   Therapist, music at [facility], [place], DO   1 year ago Visit for preventive health examination   Therapist, music at [facility], [place], DO      Future Appointments            In 3 weeks Volanda Napoleon, Langley Adie, MD [employer] at [place], [facility]           Signed Prescriptions Disp Refills   venlafaxine XR (EFFEXOR-XR) 75 MG 24 hr capsule 90 capsule 0    Sig: TAKE 3 CAPSULES BY MOUTH IN THE MORNING WITH BREAKFAST     Psychiatry: Antidepressants - SNRI - desvenlafaxine & venlafaxine Failed - 07/02/2019  8:45 AM      Failed - LDL in normal range and within 360 days    LDL Cholesterol  Date Value Ref Range Status  11/28/2017 157 (H) 0 - 99 mg/dL Final         Failed - Total Cholesterol in normal range and within 360 days    Cholesterol  Date Value Ref Range Status  11/28/2017 233 (H) 0 - 200 mg/dL Final    Comment:    ATP III Classification       Desirable:  < 200 mg/dL                Borderline High:  200 - 239 mg/dL          High:  > = 240 mg/dL         Failed - Triglycerides in normal range and within 360 days    Triglycerides  Date Value Ref Range Status  11/28/2017 100.0 0.0 - 149.0 mg/dL Final    Comment:    Normal:  <150 mg/dLBorderline High:  150 - 199 mg/dL         Failed - Last BP in normal range    BP Readings from Last 1 Encounters:  01/18/19 (!) 156/86         Passed - Valid encounter within last 6 months    Recent Outpatient Visits          4 months ago Exposure to [employer] at [place], MD   5 months ago Essential hypertension   Therapist, music at [place], MD   5 months ago No-show for appointment   [employer] at [facility],  [place], DO   1 year ago Diabetes mellitus without complication ([place])   Therapist, music at [facility], [place], DO   1 year ago Visit for preventive health examination   Therapist, music at [employer], [place], DO      Future Appointments            In 3 weeks Volanda Napoleon, Langley Adie, MD [employer] at [place], [place] - Completed PHQ-2 or PHQ-9 in the last 360 days.       lisinopril (ZESTRIL) 20 MG tablet 30 tablet 0    Sig: Take 1 tablet (20 mg total) by mouth daily.     Cardiovascular:  ACE Inhibitors Failed - 07/02/2019 10:04 AM      Failed - Last BP in normal range    BP Readings from Last 1 Encounters:  01/18/19 (!) 156/86         Passed - Cr in normal range and within 180 days    Creat  Date Value Ref Range Status  02/14/2014 0.94 0.50 - 1.10 mg/dL Final   Creatinine, Ser  Date Value Ref Range Status  01/18/2019 0.94 0.44 - 1.00 mg/dL Final         Passed - K in normal range and within 180 days    Potassium  Date Value Ref Range Status  01/18/2019 3.5 3.5 - 5.1 mmol/L Final         Passed - Patient is not pregnant      Passed - Valid encounter within last 6 months    Recent  Outpatient Visits          4 months ago Exposure to [employer] at [place], MD   5 months ago Essential hypertension   Therapist, music at [employer], [place] Ad[place], MD   5 months ago No-show for appointment   [employer] at [facility], [place], DO   1 year ago Diabetes mellitus without complication ([place])   Therapist, music at [employer], [place], DO   1 year ago Visit for preventive health examination   Therapist, music at [employer], [place], DO      Future Appointments            In 3 weeks Volanda Napoleon, Langley Adie, MD [employer] at [place], [facility]            tolterodine (DETROL LA) 4 MG 24 hr capsule 30 capsule 3    Sig: Take 1 capsule (4 mg total) by mouth daily.     Urology:  Bladder Agents Passed - 07/02/2019 10:04 AM      Passed - Valid encounter within last 12 months    Recent Outpatient Visits          4 months ago Exposure to [employer] at [place], MD   5 months ago Essential hypertension   Therapist, music at [place], MD   5 months ago No-show for appointment   [employer] at [facility], [place], DO   1 year ago Diabetes mellitus without complication ([place])   Therapist, music at [facility], [place], DO   1 year ago Visit for preventive health examination   Therapist, music at [employer], [place], DO      Future Appointments  In 3 weeks Billie Ruddy, MD [place] at [place], [facility]           Refused Prescriptions Disp Refills   amLODipine (NORVASC) 5 MG tablet 30 tablet 0    Sig: Take 1 tablet (5 mg total) by mouth daily.     Cardiovascular:  Calcium Channel Blockers Failed - 07/02/2019 10:04 AM      Failed - Last BP in normal range    BP Readings from Last 1 Encounters:  01/18/19 (!) 156/86         Passed - Valid encounter within last 6 months    Recent  Outpatient Visits          4 months ago Exposure to [employer] at [place], MD   5 months ago Essential hypertension   Therapist, music at [place], MD   5 months ago No-show for appointment   [employer] [facility], [place], DO   1 year ago Diabetes mellitus without complication ([place])   Therapist, music at [employer], [place], DO   1 year ago Visit for preventive health examination   Therapist, music at [employer], [place], DO      Future Appointments            In 3 weeks Volanda Napoleon, Langley Adie, MD [employer] at [place], [facility]              Requested Prescriptions  Pending Prescriptions Disp Refills   Fe Fum-FA-B Cmp-C-Zn-Mg-Mn-Cu (HEMOCYTE PLUS) 106-1 MG CAPS      Sig: Take 1 capsule by mouth daily.     Off-Protocol Failed - 07/02/2019 10:04 AM      Failed - Medication not assigned to a protocol, review manually.      Passed - Valid encounter within last 12 months    Recent Outpatient Visits          4 months ago Exposure to [employer] at [place], MD   5 months ago Essential hypertension   Therapist, music at [place], MD   5 months ago No-show for appointment   [employer] at [facility], [place], DO   1 year ago Diabetes mellitus without complication ([place])   Therapist, music at [employer], [place], DO   1 year ago Visit for preventive health examination   Therapist, music at [employer], [place], DO      Future Appointments            In 3 weeks Volanda Napoleon, Langley Adie, MD [employer] at [place], [facility]           Signed Prescriptions Disp Refills   venlafaxine XR (EFFEXOR-XR) 75 MG 24 hr capsule 90 capsule 0    Sig: TAKE 3 CAPSULES BY MOUTH IN THE MORNING WITH BREAKFAST     Psychiatry: Antidepressants - SNRI - desvenlafaxine & venlafaxine Failed - 07/02/2019  8:45 AM       Failed - LDL in normal range and within 360 days    LDL Cholesterol  Date Value Ref Range Status  11/28/2017 157 (H) 0 - 99 mg/dL Final         Failed - Total Cholesterol in normal range and within 360 days    Cholesterol  Date Value Ref Range Status  11/28/2017 233 (H) 0 - 200 mg/dL Final    Comment:    ATP  III Classification       Desirable:  < 200 mg/dL               Borderline High:  200 - 239 mg/dL          High:  > = 240 mg/dL         Failed - Triglycerides in normal range and within 360 days    Triglycerides  Date Value Ref Range Status  11/28/2017 100.0 0.0 - 149.0 mg/dL Final    Comment:    Normal:  <150 mg/dLBorderline High:  150 - 199 mg/dL         Failed - Last BP in normal range    BP Readings from Last 1 Encounters:  01/18/19 (!) 156/86         Passed - Valid encounter within last 6 months    Recent Outpatient Visits          4 months ago Exposure to [employer] at [place], MD   5 months ago Essential hypertension   Therapist, music at [place], MD   5 months ago No-show for appointment   [employer] at [facility], [place], DO   1 year ago Diabetes mellitus without complication ([place])   Therapist, music at [employer], [place], DO   1 year ago Visit for preventive health examination   Therapist, music at [employer], [place], DO      Future Appointments            In 3 weeks Volanda Napoleon, Langley Adie, MD [employer] at [place], [place] - Completed PHQ-2 or PHQ-9 in the last 360 days.       lisinopril (ZESTRIL) 20 MG tablet 30 tablet 0    Sig: Take 1 tablet (20 mg total) by mouth daily.     Cardiovascular:  ACE Inhibitors Failed - 07/02/2019 10:04 AM      Failed - Last BP in normal range    BP Readings from Last 1 Encounters:  01/18/19 (!) 156/86         Passed - Cr in normal range and within 180 days    Creat  Date Value Ref Range  Status  02/14/2014 0.94 0.50 - 1.10 mg/dL Final   Creatinine, Ser  Date Value Ref Range Status  01/18/2019 0.94 0.44 - 1.00 mg/dL Final         Passed - K in normal range and within 180 days    Potassium  Date Value Ref Range Status  01/18/2019 3.5 3.5 - 5.1 mmol/L Final         Passed - Patient is not pregnant      Passed - Valid encounter within last 6 months    Recent Outpatient Visits          4 months ago Exposure to [employer] at [place], MD   5 months ago Essential hypertension   Therapist, music at [employer], Langley Adie, MD   5 months ago No-show for appointment   [employer] at [employer], [place], DO   1 year ago Diabetes mellitus without complication ([place])   Therapist, music at [employer], [place], DO   1 year ago Visit for preventive health examination   Therapist, music at [employer], [place], DO      Future  Appointments            In 3 weeks Billie Ruddy, MD [place] at [place], [facility]            tolterodine (DETROL LA) 4 MG 24 hr capsule 30 capsule 3    Sig: Take 1 capsule (4 mg total) by mouth daily.     Urology:  Bladder Agents Passed - 07/02/2019 10:04 AM      Passed - Valid encounter within last 12 months    Recent Outpatient Visits          4 months ago Exposure to [employer] at [place], MD   5 months ago Essential hypertension   Therapist, music at [place], MD   5 months ago No-show for appointment   [employer] [facility] [employer], [place], DO   1 year ago Diabetes mellitus without complication ([place])   Therapist, music at [employer], [place], DO   1 year ago Visit for preventive health examination   Therapist, music at [employer], [place], DO      Future Appointments            In 3 weeks Volanda Napoleon, Langley Adie, MD [employer] at [place], [facility]            Refused Prescriptions Disp Refills   amLODipine (NORVASC) 5 MG tablet 30 tablet 0    Sig: Take 1 tablet (5 mg total) by mouth daily.     Cardiovascular:  Calcium Channel Blockers Failed - 07/02/2019 10:04 AM      Failed - Last BP in normal range    BP Readings from Last 1 Encounters:  01/18/19 (!) 156/86         Passed - Valid encounter within last 6 months    Recent Outpatient Visits          4 months ago Exposure to [employer] at [place], MD   5 months ago Essential hypertension   Therapist, music at [place], MD   5 months ago No-show for appointment   [employer] at [employer], [place], DO   1 year ago Diabetes mellitus without complication ([place])   Therapist, music at [employer], [place], DO   1 year ago Visit for preventive health examination   Therapist, music at [employer], [place], DO      Future Appointments            In 3 weeks Volanda Napoleon, Langley Adie, MD [employer] at [place], [facility]

## 2019-07-26 ENCOUNTER — Encounter: Payer: BC Managed Care – PPO | Admitting: Family Medicine

## 2019-08-10 ENCOUNTER — Ambulatory Visit: Payer: Self-pay

## 2019-08-10 ENCOUNTER — Telehealth: Payer: Self-pay | Admitting: Family Medicine

## 2019-08-10 ENCOUNTER — Other Ambulatory Visit: Payer: Self-pay | Admitting: Family Medicine

## 2019-08-10 DIAGNOSIS — Z760 Encounter for issue of repeat prescription: Secondary | ICD-10-CM

## 2019-08-10 DIAGNOSIS — F341 Dysthymic disorder: Secondary | ICD-10-CM

## 2019-08-10 MED ORDER — VENLAFAXINE HCL ER 75 MG PO CP24: ORAL_CAPSULE | ORAL | 0 refills | Status: DC

## 2019-08-10 NOTE — Telephone Encounter (Signed)
Patient called stating that she needs her Effexor fill now She is out and having nausea. Patient was advised that medication was sent back to Dr Volanda Napoleon because she needs appointment. Patient states she lost her insurance and was unable to keep previous appointment.  Call paced to ON Call Dr Raoul Pitch.  Patient was informed that her med would be refilled for 1 week but she needed appointment for further refills. Patient verbalized understanding.  Reason for Disposition . [1] Caller has URGENT medication question about med that PCP or specialist prescribed AND [2] triager unable to answer question  Answer Assessment - Initial Assessment Questions 1.   NAME of MEDICATION: "What medicine are you calling about?"     Refill effexor 2.   QUESTION: "What is your question?"    No 3.   PRESCRIBING HCP: "Who prescribed it?" Reason: if prescribed by specialist, call should be referred to that group.    [place] 4. SYMPTOMS: "Do you have any symptoms?"     nausea 5. SEVERITY: If symptoms are present, ask "Are they mild, moderate or severe?"     anxiety 6.  PREGNANCY:  "Is there any chance that you are pregnant?" "When was your last menstrual period?"    N/A  Protocols used: MEDICATION QUESTION CALL-A-AH

## 2019-08-10 NOTE — Telephone Encounter (Signed)
Please advise 

## 2019-08-10 NOTE — Telephone Encounter (Signed)
Requested medication (s) are due for refill today: yes  Requested medication (s) are on the active medication list: yes  Last refill:  07/02/2019  Future visit scheduled:no  Notes to clinic:  Review for refill Overdue for office visit Last appointment was canceled    Requested Prescriptions  Pending Prescriptions Disp Refills   venlafaxine XR (EFFEXOR-XR) 75 MG 24 hr capsule 90 capsule 0    Sig: TAKE 3 CAPSULES BY MOUTH IN THE MORNING WITH BREAKFAST     Psychiatry: Antidepressants - SNRI - desvenlafaxine & venlafaxine Failed - 08/10/2019 12:53 PM      Failed - LDL in normal range and within 360 days    LDL Cholesterol  Date Value Ref Range Status  11/28/2017 157 (H) 0 - 99 mg/dL Final         Failed - Total Cholesterol in normal range and within 360 days    Cholesterol  Date Value Ref Range Status  11/28/2017 233 (H) 0 - 200 mg/dL Final    Comment:    ATP III Classification       Desirable:  < 200 mg/dL               Borderline High:  200 - 239 mg/dL          High:  > = 240 mg/dL         Failed - Triglycerides in normal range and within 360 days    Triglycerides  Date Value Ref Range Status  11/28/2017 100.0 0.0 - 149.0 mg/dL Final    Comment:    Normal:  <150 mg/dLBorderline High:  150 - 199 mg/dL         Failed - Last BP in normal range    BP Readings from Last 1 Encounters:  01/18/19 (!) 156/86         Failed - Valid encounter within last 6 months    Recent Outpatient Visits          6 months ago Exposure to [employer] at [place], MD   6 months ago Essential hypertension   Therapist, music at [employer], Langley Adie, MD   7 months ago No-show for appointment   [employer] [facility] [employer], [place], DO   1 year ago Diabetes mellitus without complication ([place])   Therapist, music at [employer], [place], DO   1 year ago Visit for preventive health examination   Therapist, music at [employer], [place] R, DO             Passed - Completed PHQ-2 or PHQ-9 in the last 360 days.

## 2019-08-10 NOTE — Telephone Encounter (Signed)
Patient checking on the status of medication refill request mentioned below, advised patient turn around time is 48 to 72 hour turn around time, patient states she's completely out and the type of medication she can not wait and has to be filled as soon as possible. Practice is closed transferred patient to [facility] Nurse Triage.

## 2019-08-10 NOTE — Progress Notes (Signed)
Received after hours call on patient.  She had called in for her Effexor refill today at 1 PM.  Patient was advised refill requests can require up to 3 days.  She is also due for follow-up with her primary care physician. In order to attempt to avoid an ED visit, I will refill her medication for her Effexor 75 mg (total dose 225 mg) for 7 days only.  Patient must be seen by her PCP for any additional refills.    Electronically Signed by: Howard Pouch, DO [place] [facility]

## 2019-08-10 NOTE — Telephone Encounter (Signed)
Medication Refill - Medication:  venlafaxine XR (EFFEXOR-XR) 75 MG 24 hr capsule   Has the patient contacted their pharmacy?  Yes advised to call office. Patient is wanting a call when done  Preferred Pharmacy (with phone number or street name):  [place] [phone]  Agent: Please be advised that RX refills may take up to 3 business days. We ask that you follow-up with your pharmacy.

## 2019-08-12 NOTE — Progress Notes (Signed)
Thanks for the refill, however med was refilled on 10/30 for a 90 day supply until she could be seen.  Unsure if her pharmacy contacted her or not.

## 2019-08-15 NOTE — Telephone Encounter (Signed)
Pt has an appt sch for 08-17-2019 and needs new rx effexor 75 mg pt takes 3 a day . Pt has new pharm [facility] [address]

## 2019-08-17 ENCOUNTER — Telehealth: Payer: Self-pay | Admitting: Family Medicine

## 2019-08-20 ENCOUNTER — Telehealth: Payer: Self-pay

## 2019-08-20 ENCOUNTER — Telehealth: Payer: Self-pay | Admitting: Family Medicine

## 2019-08-20 NOTE — Telephone Encounter (Signed)
Copied from [place] [phone]. Topic: Appointment Scheduling - Scheduling Inquiry for Clinic >> Aug 15, 2019 12:05 PM Lennox Solders wrote: Reason for CRM: pt has an appt on 08-17-2019 and pt is out of effexor

## 2019-08-20 NOTE — Telephone Encounter (Signed)
Pt has been scheduled for a med refill virtual visit with Dr Volanda Napoleon this afternoon. 08/20/2019

## 2019-08-21 ENCOUNTER — Telehealth (INDEPENDENT_AMBULATORY_CARE_PROVIDER_SITE_OTHER): Payer: Self-pay | Admitting: Family Medicine

## 2019-08-21 DIAGNOSIS — F341 Dysthymic disorder: Secondary | ICD-10-CM

## 2019-08-21 MED ORDER — VENLAFAXINE HCL ER 75 MG PO CP24: ORAL_CAPSULE | ORAL | 3 refills | Status: DC

## 2019-08-21 NOTE — Telephone Encounter (Signed)
Pt Rx was refilled by Dr Volanda Napoleon on 08/21/2019

## 2019-08-21 NOTE — Progress Notes (Signed)
Virtual Visit via Telephone Note  I connected with Rachel Fields on 08/21/19 at  1:30 PM EST by telephone and verified that I am speaking with the correct person using two identifiers.   I discussed the limitations, risks, security and privacy concerns of performing an evaluation and management service by telephone and the availability of in person appointments. I also discussed with the patient that there may be a patient responsible charge related to this service. The patient expressed understanding and agreed to proceed.  Location patient: home Location provider: work or home office Participants present for the call: patient, provider Patient did not have a visit in the prior 7 days to address this/these issue(s).   History of Present Illness: Pt needs refill on Effexor-XR 75 mg, 3 pills q am.  Pt states she ran out the med last month and lost her insurance when she lost her job.  Per chart review, refill sent in by this provider, but cancelled when the on-call provider sent in a 7 day script.  Pt states she has a new job but [employer] has not started yet.  Pt states overall she was feeling good until she was off the Effexor.  Pt states she felt depressed at times thinking about events from her childhood.  Pt states now that she is back on the meds she is better.  Pt states appetite is ok.  Sleep is not good, but is attributed to working 3rd shift.  Pt has never gone to counseling, but is open to the idea.   Observations/Objective: Patient sounds cheerful and well on the phone. I do not appreciate any SOB. Speech and thought processing are grossly intact. Patient reported vitals:  Assessment and Plan: Anxiety and Depression -stable -continue Effexor XR 225 mg daily.   -pt advised to start counseling.  Given names of area providers.  Pt wishes to wait until she gets insurance. - Plan: venlafaxine XR (EFFEXOR-XR) 75 MG 24 hr capsule  F/u in 3-4 months, sooner if needed.  Follow Up  Instructions:   I did not refer this patient for an OV in the next 24 hours for this/these issue(s).  I discussed the assessment and treatment plan with the patient. The patient was provided an opportunity to ask questions and all were answered. The patient agreed with the plan and demonstrated an understanding of the instructions.   The patient was advised to call back or seek an in-person evaluation if the symptoms worsen or if the condition fails to improve as anticipated.  I provided 16 minutes of non-face-to-face time during this encounter.   Billie Ruddy, MD

## 2019-09-04 ENCOUNTER — Telehealth: Payer: Self-pay | Admitting: Family Medicine

## 2019-09-04 ENCOUNTER — Other Ambulatory Visit: Payer: Self-pay

## 2019-09-04 DIAGNOSIS — N3281 Overactive bladder: Secondary | ICD-10-CM

## 2019-09-04 DIAGNOSIS — I10 Essential (primary) hypertension: Secondary | ICD-10-CM

## 2019-09-04 MED ORDER — TOLTERODINE TARTRATE ER 4 MG PO CP24: 4.0000 mg | ORAL_CAPSULE | Freq: Every day | ORAL | 3 refills | Status: DC

## 2019-09-04 MED ORDER — AMLODIPINE BESYLATE 5 MG PO TABS: 5.0000 mg | ORAL_TABLET | Freq: Every day | ORAL | 1 refills | Status: DC

## 2019-09-04 MED ORDER — LISINOPRIL 20 MG PO TABS: 20.0000 mg | ORAL_TABLET | Freq: Every day | ORAL | 1 refills | Status: DC

## 2019-09-04 NOTE — Telephone Encounter (Signed)
Rx sent to pt pharmacy, pt iron( Fe fumarate)  was not sent in for refill, last refill was in 2015 by historical doctor. Pt also state that she want a stronger pain medication for her Arthritis pain. Please advise

## 2019-09-04 NOTE — Telephone Encounter (Signed)
Pt advised to continue f/u with Ortho for options regarding her arthritis pain.

## 2019-09-04 NOTE — Telephone Encounter (Signed)
lisinopril (ZESTRIL) 20 MG tablet , tolterodine (DETROL LA) 4 MG 24 hr capsule, amLODipine (NORVASC) 5 MG tablet , iron pill (patient didn't know the name ) and requesting a stronger pain medication, informed patient please allow 48 to 72 hour turn around time, patient would like a follow up call when Rx are sent   [place] ([place]), [place] - [address]

## 2019-09-04 NOTE — Telephone Encounter (Signed)
Spoke with pt verbalized understanding that she needs to f/u with Orthopedics for her Arthritis pain per Dr Volanda Napoleon

## 2019-09-13 ENCOUNTER — Other Ambulatory Visit: Payer: Self-pay

## 2019-09-13 ENCOUNTER — Encounter (HOSPITAL_COMMUNITY): Payer: Self-pay

## 2019-09-13 ENCOUNTER — Emergency Department (HOSPITAL_COMMUNITY)
Admission: EM | Admit: 2019-09-13 | Discharge: 2019-09-13 | Disposition: A | Discharge status: Home / Self Care | Payer: Self-pay | Attending: Emergency Medicine | Admitting: Emergency Medicine

## 2019-09-13 DIAGNOSIS — K029 Dental caries, unspecified: Secondary | ICD-10-CM

## 2019-09-13 DIAGNOSIS — K0889 Other specified disorders of teeth and supporting structures: Secondary | ICD-10-CM | POA: Insufficient documentation

## 2019-09-13 DIAGNOSIS — Z79899 Other long term (current) drug therapy: Secondary | ICD-10-CM | POA: Insufficient documentation

## 2019-09-13 DIAGNOSIS — E119 Type 2 diabetes mellitus without complications: Secondary | ICD-10-CM | POA: Insufficient documentation

## 2019-09-13 DIAGNOSIS — I10 Essential (primary) hypertension: Secondary | ICD-10-CM | POA: Insufficient documentation

## 2019-09-13 MED ORDER — PENICILLIN V POTASSIUM 500 MG PO TABS: 500.0000 mg | ORAL_TABLET | Freq: Four times a day (QID) | ORAL | 0 refills | Status: AC

## 2019-09-13 NOTE — ED Triage Notes (Addendum)
Pt c/o dental pain off and on for 2 weeks. BP 194/82, pt states she has not taken prescribed Lisinopril in 2 days due to running out.

## 2019-09-13 NOTE — ED Notes (Signed)
Pt informed RN that she has a prescription for Lisinopril ready for pick up at the [facility] on [address] that she is picking up after discharge from ED.

## 2019-09-13 NOTE — Discharge Instructions (Signed)
Please read instructions below. Take the antibiotic, Penicillin V, 4 times per day until they are gone. You can take over-the-counter medications for pain. Schedule an appointment with a dentist, using the dental resource guide attached. Return to the ER for difficulty swallowing or breathing, fever, or new or worsening symptoms.

## 2019-09-13 NOTE — ED Notes (Signed)
An After Visit Summary was printed and given to the patient. Discharge instructions given and no further questions at this time.  

## 2019-09-13 NOTE — ED Provider Notes (Signed)
[facility] DEPT Provider Note   CSN: [id number] Arrival date & time: 09/13/19  1219     History   Chief Complaint Chief Complaint  Patient presents with  . Dental Pain    HPI Mande Kolasinski is a 63 y.o. female past medical history of hypertension, presenting to the emergency department with complaint of gradual onset of intermittent lower dental pain for the last couple of weeks.  She states she does not have a dentist.  Her tooth is loose and painful.  No fevers or swelling in her mouth.  She also requests refill of her blood pressure medication, amlodipine and lisinopril.  Her last dose was 2 days ago.  She is having no other associated symptoms.     The history is provided by the patient and medical records.    Past Medical History:  Diagnosis Date  . Allergy   . Anxiety   . Anxiety and depression   . Arthritis   . Depression   . HTN (hypertension)   . Hx of iron deficiency anemia    reports [place] trait; reports on iron her whole life  . Hypertension   . Low back pain    chronic, reports hx DDD treated by chiropractor  . No blood products    Jehovah's Witness  . OAB (overactive bladder)   . Seasonal allergies   . Sickle cell trait ([place])   . Uterine fibroid     Patient Active Problem List   Diagnosis Date Noted  . Unilateral primary osteoarthritis, right hip 09/27/2018  . Diabetes mellitus without complication ([place]) 99991111  . Hypertension associated with diabetes ([place]) 12/08/2017  . BMI 32.0-32.9,adult 12/08/2017  . Seasonal allergies 01/21/2015  . OAB (overactive bladder) 01/21/2015  . Hyperlipidemia associated with type 2 diabetes mellitus ([place]) 02/14/2014  . Obesity 12/19/2009  . LOW BACK PAIN 03/18/2008  . DEPRESSION/ANXIETY 06/15/2007  . HYPERTENSION, BENIGN 06/15/2007    Past Surgical History:  Procedure Laterality Date  . BUNIONECTOMY Bilateral 1987  . CESAREAN SECTION    . Teeth extracted   01/14/2017  . TUBAL  LIGATION    . TUMOR REMOVAL     behind left eye     OB History   No obstetric history on file.      Home Medications    Prior to Admission medications   Medication Sig Start Date End Date Taking? Authorizing Provider  amLODipine (NORVASC) 5 MG tablet Take 1 tablet (5 mg total) by mouth daily. 09/04/19   Billie Ruddy, MD  calcium-vitamin D (CALCIUM + D) 250-125 MG-UNIT per tablet Take 1 tablet by mouth daily. 02/14/14   Robbie Lis, MD  cyclobenzaprine (FLEXERIL) 5 MG tablet Take 1 tablet (5 mg total) by mouth 2 (two) times daily as needed for muscle spasms. 01/04/17   Lucretia Kern, DO  diclofenac sodium (VOLTAREN) 1 % GEL Apply 2 g topically 4 (four) times daily. 12/19/17   Leandrew Koyanagi, MD  Fe Fum-FA-B Cmp-C-Zn-Mg-Mn-Cu (HEMOCYTE PLUS) 106-1 MG CAPS Take 1 capsule by mouth daily. 02/14/14   Robbie Lis, MD  fluticasone [facility]) 50 MCG/ACT nasal spray Place 1 spray into both nostrils at bedtime. 01/22/19   Billie Ruddy, MD  lisinopril (ZESTRIL) 20 MG tablet Take 1 tablet (20 mg total) by mouth daily. 09/04/19   Billie Ruddy, MD  OVER THE COUNTER MEDICATION Take 2 tablets by mouth daily. Biotin 5083mcg    [provider]  penicillin v potassium (  VEETID) 500 MG tablet Take 1 tablet (500 mg total) by mouth 4 (four) times daily for 7 days. 09/13/19 09/20/19  Robinson, [place] N, PA-C  rosuvastatin (CRESTOR) 10 MG tablet Take 1 tablet (10 mg total) by mouth daily. 01/22/19   Billie Ruddy, MD  tolterodine (DETROL LA) 4 MG 24 hr capsule Take 1 capsule (4 mg total) by mouth daily. 09/04/19   Billie Ruddy, MD  venlafaxine XR (EFFEXOR-XR) 75 MG 24 hr capsule TAKE 3 CAPSULES BY MOUTH IN THE MORNING WITH BREAKFAST 08/21/19   Billie Ruddy, MD    Family History Family History  Problem Relation Age of Onset  . Diabetes Father   . Hypertension Father   . Dementia Father        senile  . Other Mother        smoker  . Lung cancer Mother        2010  . Bipolar  disorder Sister   . Schizophrenia Sister   . Schizophrenia Sister   . Bipolar disorder Sister   . Schizophrenia Sister   . Bipolar disorder Sister   . Bipolar disorder Daughter   . Colon cancer Neg Hx     Social History Social History   Tobacco Use  . Smoking status: Never Smoker  . Smokeless tobacco: Never Used  Substance Use Topics  . Alcohol use: Yes    Comment: socially - rare; 2 drinks a few times per month  . Drug use: No     Allergies   Patient has no known allergies.   Review of Systems Review of Systems  All other systems reviewed and are negative.    Physical Exam Updated Vital Signs BP (!) 173/89   Pulse 78   Temp 98.8 F (37.1 C) (Oral)   Resp 18   SpO2 99%   Physical Exam Vitals signs and nursing note reviewed.  Constitutional:      Appearance: She is well-developed.  HENT:     Head: Normocephalic and atraumatic.     Mouth/Throat:     Comments: Poor dentition throughout with multiple missing teeth.  Right lower incisor is loose with decay.  There is some mild surrounding gingival erythema, no fluctuance.  No sublingual edema or tenderness.  No trismus.  Uvula midline, tolerating secretions. Eyes:     Conjunctiva/sclera: Conjunctivae normal.  Cardiovascular:     Rate and Rhythm: Normal rate and regular rhythm.  Pulmonary:     Effort: Pulmonary effort is normal.     Breath sounds: Normal breath sounds.  Neurological:     Mental Status: She is alert.  Psychiatric:        Mood and Affect: Mood normal.        Behavior: Behavior normal.      ED Treatments / Results  Labs (all labs ordered are listed, but only abnormal results are displayed) Labs Reviewed - No data to display  EKG None  Radiology No results found.  Procedures Procedures (including critical care time)  Medications Ordered in ED Medications - No data to display   Initial Impression / Assessment and Plan / ED Course  I have reviewed the triage vital signs and the  nursing notes.  Pertinent labs & imaging results that were available during my care of the patient were reviewed by me and considered in my medical decision making (see chart for details).        Patient with dental caries.  No gross abscess.  VSS, afebrile,  tolerating secretions. Exam unconcerning for peritonsillar abscess, Ludwig's angina or spread of infection.  Will treat with penicillin and OTC pain medicine.  Urged patient to follow-up with dentist.  Patient also requesting blood pressure medication refill. No concerning symptoms regarding high blood pressure. Per chart review she phoned her PCP office on 09/04/2019 for refill.  She states she never heard from them, however it is documented that refill was sent into a [facility].  Provided patient with this information.  She called this pharmacy while in the ED and confirms that her prescriptions are ready.  PCP follow-up.  Safe for discharge.  Discussed results, findings, treatment and follow up. Patient advised of return precautions. Patient verbalized understanding and agreed with plan.  Final Clinical Impressions(s) / ED Diagnoses   Final diagnoses:  Pain due to dental caries  Hypertension, unspecified type    ED Discharge Orders         Ordered    penicillin v potassium (VEETID) 500 MG tablet  4 times daily     09/13/19 1301           Robinson, Martinique N, [place] 09/13/19 1308    Lacretia Leigh, MD 09/14/19 1432

## 2019-10-02 ENCOUNTER — Other Ambulatory Visit: Payer: Self-pay

## 2019-10-02 DIAGNOSIS — I10 Essential (primary) hypertension: Secondary | ICD-10-CM

## 2019-10-22 ENCOUNTER — Other Ambulatory Visit: Payer: Self-pay | Admitting: Family Medicine

## 2019-10-22 ENCOUNTER — Telehealth: Payer: Self-pay | Admitting: Family Medicine

## 2019-10-22 DIAGNOSIS — F32A Depression, unspecified: Secondary | ICD-10-CM

## 2019-10-22 DIAGNOSIS — F329 Major depressive disorder, single episode, unspecified: Secondary | ICD-10-CM

## 2019-10-22 DIAGNOSIS — F419 Anxiety disorder, unspecified: Secondary | ICD-10-CM

## 2019-10-22 MED ORDER — VENLAFAXINE HCL 100 MG PO TABS: 100.0000 mg | ORAL_TABLET | Freq: Two times a day (BID) | ORAL | 1 refills | Status: DC

## 2019-10-22 MED ORDER — VENLAFAXINE HCL 25 MG PO TABS: 12.5000 mg | ORAL_TABLET | Freq: Two times a day (BID) | ORAL | 1 refills | Status: DC

## 2019-10-22 NOTE — Telephone Encounter (Signed)
Copied from [place] [phone]. Topic: General - Other >> Oct 22, 2019  9:07 AM Keene Breath wrote: Reason for CRM: Patient called to request that the doctor send in her medication, venlafaxine XR (EFFEXOR-XR) 75 MG 24 hr capsule, in tablet form because she cannot afford the capsules.  She stated she really would like it sent in today because she can't sleep and really needs the medication today to function properly.  Please advise and CB at [phone]

## 2019-10-22 NOTE — Telephone Encounter (Signed)
Ok to change Rx to capsule due to cost

## 2019-10-22 NOTE — Telephone Encounter (Signed)
Called pt left detailed message for pt to call her pharmacy regarding her Effexor-xr refills

## 2019-10-22 NOTE — Telephone Encounter (Signed)
Rx changed to Effexor tablets 112.5 mg BID.  Pt will have to take a 100 mg tab and half of a 25 mg tab twice a day.  Prescriptions sent to pt's [place].

## 2019-10-22 NOTE — Telephone Encounter (Signed)
Patient is calling back returning Rachel Fields's call. Patient states that she is requesting a change in prescription to tablets instead of Capsules. Patient states that the medication is cheaper in the tablets format. Please advise Cb- [phone]

## 2019-10-23 ENCOUNTER — Other Ambulatory Visit: Payer: Self-pay

## 2019-10-23 DIAGNOSIS — F32A Depression, unspecified: Secondary | ICD-10-CM

## 2019-10-23 DIAGNOSIS — F419 Anxiety disorder, unspecified: Secondary | ICD-10-CM

## 2019-10-23 DIAGNOSIS — F329 Major depressive disorder, single episode, unspecified: Secondary | ICD-10-CM

## 2019-10-23 NOTE — Telephone Encounter (Signed)
Spoke with pt verbalized understanding that her Rx was sent to her pharmacy, advised pt on the changed directions for the Rx, Per pt request all prescriptions are to go to [facility] at [facility]. Changes made on pt chart

## 2019-10-26 ENCOUNTER — Telehealth: Payer: Self-pay

## 2019-10-26 NOTE — Telephone Encounter (Signed)
Spoke with pt states that she cant afford to pay for two Rx since she has insurance. Pt states that she is very confused on why the pharmacy does not carry the same dose she was previously taking. Advised pt to go to [facility] and research which pharmacy is cheaper. Pt state that she has no time to do all that and just want to go back to the regular Rx she was taking before, the capsule.

## 2019-10-30 NOTE — Telephone Encounter (Signed)
Ok for the extended release pill if pt would like to go back to original rx.

## 2019-10-30 NOTE — Telephone Encounter (Signed)
Pt returning call. Please advise. °

## 2019-10-31 NOTE — Telephone Encounter (Signed)
Called pt left message on pt voicemail to call the office back regarding her Effexor

## 2020-02-20 ENCOUNTER — Telehealth: Payer: Self-pay | Admitting: Orthopaedic Surgery

## 2020-02-20 NOTE — Telephone Encounter (Signed)
We haven't seen her since 2019.  What restrictions was she on?

## 2020-02-20 NOTE — Telephone Encounter (Signed)
Patient called  Needs a new letter detailing her work restrictions for her new position at [employer].   She requested that the note be emailed to her. I informed her that may not be an option so she said she will provide a fax number if her request for an email is denied.   Call back: [phone]

## 2020-02-21 ENCOUNTER — Other Ambulatory Visit: Payer: Self-pay | Admitting: Family Medicine

## 2020-02-21 DIAGNOSIS — I10 Essential (primary) hypertension: Secondary | ICD-10-CM

## 2020-02-21 NOTE — Telephone Encounter (Signed)
Called patient no answer LMOM. What restrictions was she on? What restrictions would she like? What does she do at her job?

## 2020-02-26 ENCOUNTER — Other Ambulatory Visit: Payer: Self-pay

## 2020-02-26 ENCOUNTER — Telehealth: Payer: Self-pay | Admitting: Family Medicine

## 2020-02-26 DIAGNOSIS — N3281 Overactive bladder: Secondary | ICD-10-CM

## 2020-02-26 DIAGNOSIS — I10 Essential (primary) hypertension: Secondary | ICD-10-CM

## 2020-02-26 MED ORDER — AMLODIPINE BESYLATE 5 MG PO TABS: 5.0000 mg | ORAL_TABLET | Freq: Every day | ORAL | 0 refills | Status: DC

## 2020-02-26 MED ORDER — TOLTERODINE TARTRATE ER 4 MG PO CP24: 4.0000 mg | ORAL_CAPSULE | Freq: Every day | ORAL | 0 refills | Status: DC

## 2020-02-26 NOTE — Telephone Encounter (Signed)
Rx for Amlodipine and Detrol LA sent to pt pharmacy, lisinopril refill was already sent to pt pharmacy. Pt already has appointment scheduled with Dr Volanda Napoleon on 03/07/2020, left a message for pt advising that the Effexor will require a visit for renewal or pt can have this reviewed by Dr Volanda Napoleon at the visit on 03/07/2020

## 2020-02-26 NOTE — Telephone Encounter (Signed)
The patient is needing refills on 5 Rx  venlafaxine (EFFEXOR) 100 MG tablet venlafaxine (EFFEXOR) 25 MG tablet The patient stated that she takes 3 capsules of 75 mg of this Rx   tolterodine (DETROL LA) 4 MG 24 hr capsule  amLODipine (NORVASC) 5 MG tablet  lisinopril (ZESTRIL) 20 MG tablet  Ferosul- Iron pills  [id number] - [place], [place] - [address] AT [address] Phone:  [phone]  Fax:  [phone]

## 2020-03-07 ENCOUNTER — Encounter: Payer: Self-pay | Admitting: Family Medicine

## 2020-03-07 DIAGNOSIS — Z0289 Encounter for other administrative examinations: Secondary | ICD-10-CM

## 2020-03-07 NOTE — Telephone Encounter (Signed)
Pt had an office appointment today for CPE / Med refill but pt was no show, medication not able to get refilled

## 2020-04-09 ENCOUNTER — Telehealth: Payer: Self-pay | Admitting: Family Medicine

## 2020-04-09 NOTE — Telephone Encounter (Signed)
Pt is requesting a refill on Amlodipine 5 mg tablet, Venlafaxine 75 mg, and Zestril 20 mg tablet. Pt uses [facility] [place]

## 2020-04-10 ENCOUNTER — Other Ambulatory Visit: Payer: Self-pay

## 2020-04-10 DIAGNOSIS — I10 Essential (primary) hypertension: Secondary | ICD-10-CM

## 2020-04-10 MED ORDER — AMLODIPINE BESYLATE 5 MG PO TABS: 5.0000 mg | ORAL_TABLET | Freq: Every day | ORAL | 0 refills | Status: DC

## 2020-04-10 MED ORDER — LISINOPRIL 20 MG PO TABS: 20.0000 mg | ORAL_TABLET | Freq: Every day | ORAL | 0 refills | Status: DC

## 2020-04-10 NOTE — Telephone Encounter (Signed)
Pt refill for Lisinopril 20 mg and Amlodipine 5 mg was sent to pt pharmacy, Sent enough to last pt through until her appointment, pt appointment  on 05/02/2020, please advise pt request for  Venlafaxine 75 mg

## 2020-04-15 ENCOUNTER — Telehealth: Payer: Self-pay | Admitting: Family Medicine

## 2020-04-15 NOTE — Telephone Encounter (Signed)
Pt is calling in stating that she needs tolterodine (DETROL LA) 4 MG,fluticasone (FLONASE) 50 MCG and (whatever her pain pill is pt not sure of the name).  [facility] [address]

## 2020-04-15 NOTE — Telephone Encounter (Signed)
Pt LOV was on 08/21/2019 and refill for Detrol LA was done on 02/26/2020 for 30 tablets, pt Flonase was last filled on 01/22/2019, pt cancelled previous appointment and rescheduled  for 05/02/2020, ok to send refills or wait for pt appointment

## 2020-04-21 ENCOUNTER — Other Ambulatory Visit: Payer: Self-pay | Admitting: Family Medicine

## 2020-04-21 DIAGNOSIS — J302 Other seasonal allergic rhinitis: Secondary | ICD-10-CM

## 2020-04-21 DIAGNOSIS — I10 Essential (primary) hypertension: Secondary | ICD-10-CM

## 2020-04-21 DIAGNOSIS — F419 Anxiety disorder, unspecified: Secondary | ICD-10-CM

## 2020-04-21 DIAGNOSIS — N3281 Overactive bladder: Secondary | ICD-10-CM

## 2020-04-21 DIAGNOSIS — F32A Depression, unspecified: Secondary | ICD-10-CM

## 2020-04-21 MED ORDER — TOLTERODINE TARTRATE ER 4 MG PO CP24: 4.0000 mg | ORAL_CAPSULE | Freq: Every day | ORAL | 0 refills | Status: DC

## 2020-04-21 MED ORDER — FLUTICASONE PROPIONATE 50 MCG/ACT NA SUSP: 1.0000 | Freq: Every day | NASAL | 0 refills | Status: DC

## 2020-04-21 MED ORDER — VENLAFAXINE HCL 25 MG PO TABS: 12.5000 mg | ORAL_TABLET | Freq: Two times a day (BID) | ORAL | 0 refills | Status: DC

## 2020-04-21 MED ORDER — VENLAFAXINE HCL 100 MG PO TABS: 100.0000 mg | ORAL_TABLET | Freq: Two times a day (BID) | ORAL | 0 refills | Status: DC

## 2020-04-21 NOTE — Telephone Encounter (Signed)
Meds refilled until appt.

## 2020-05-02 ENCOUNTER — Encounter: Payer: Self-pay | Admitting: Family Medicine

## 2020-05-13 ENCOUNTER — Telehealth: Payer: Self-pay | Admitting: Family Medicine

## 2020-05-13 ENCOUNTER — Ambulatory Visit (HOSPITAL_COMMUNITY)
Admission: EM | Admit: 2020-05-13 | Discharge: 2020-05-13 | Disposition: A | Discharge status: Home / Self Care | Payer: BC Managed Care – PPO | Attending: Family Medicine | Admitting: Family Medicine

## 2020-05-13 ENCOUNTER — Encounter (HOSPITAL_COMMUNITY): Payer: Self-pay

## 2020-05-13 ENCOUNTER — Other Ambulatory Visit: Payer: Self-pay

## 2020-05-13 DIAGNOSIS — M79601 Pain in right arm: Secondary | ICD-10-CM | POA: Diagnosis not present

## 2020-05-13 DIAGNOSIS — I10 Essential (primary) hypertension: Secondary | ICD-10-CM | POA: Diagnosis not present

## 2020-05-13 MED ORDER — AMLODIPINE BESYLATE 5 MG PO TABS: 5.0000 mg | ORAL_TABLET | Freq: Every day | ORAL | 0 refills | Status: DC

## 2020-05-13 MED ORDER — NAPROXEN 500 MG PO TABS: 500.0000 mg | ORAL_TABLET | Freq: Two times a day (BID) | ORAL | 0 refills | Status: DC

## 2020-05-13 MED ORDER — CYCLOBENZAPRINE HCL 5 MG PO TABS: 5.0000 mg | ORAL_TABLET | Freq: Two times a day (BID) | ORAL | 0 refills | Status: DC | PRN

## 2020-05-13 NOTE — Discharge Instructions (Signed)
Begin naprosyn twice daily for the next 7-10 days with food You may use flexeril as needed to help with pain. This is a muscle relaxer and causes sedation- please use only at bedtime or when you will be home and not have to drive/work Gentle stretching or arm  Your blood pressure was elevated today in clinic. Please be sure to take blood pressure medications as prescribed. Please monitor your blood pressure at home or when you go to a [facility]/Gym. Please follow up with your primary care doctor to recheck blood pressure and discuss any need for medication changes.   Please go to Emergency Room if you start to experience severe headache, vision changes, decreased urine production, chest pain, shortness of breath, speech slurring, one sided weakness.

## 2020-05-13 NOTE — Telephone Encounter (Signed)
Tried to contact patient and the voicemail is not set up.  We received Triage Nurse After hours for the patient wanting to schedule a new patient appointment. Patient was advised to contact the office during business hours.

## 2020-05-13 NOTE — ED Triage Notes (Signed)
Pt presents with recurrent  right arm pain.  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

## 2020-05-13 NOTE — ED Provider Notes (Signed)
[facility]    CSN: [id number] Arrival date & time: 05/13/20  1651      History   Chief Complaint Chief Complaint  Patient presents with  . Arm Pain    HPI Rachel Fields is a 64 y.o. female presenting today for evaluation of right arm pain.  Patient reports over the past few months she has had intermittent pain in her upper arm.  Reports symptoms began after she received her Covid vaccine.  Had first dose on 11/21/2019, second dose on 12/19/2019.  Pain has been intermittent, worse after work.  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  She denies any limitation in range of motion of her extremity.  Denies any numbness or tingling or pain/paresthesias distally.  Has used some topical medicine, but has not used a lot of oral medicine for pain.  Also concerned about her blood pressure, reports she has been off of her blood pressure medicine for a while.  Stop taking lisinopril as she did not feel this was doing anything.  Follows up with Dr. Volanda Napoleon for PCP.  HPI  Past Medical History:  Diagnosis Date  . Allergy   . Anxiety   . Anxiety and depression   . Arthritis   . Depression   . HTN (hypertension)   . Hx of iron deficiency anemia    reports [place] trait; reports on iron her whole life  . Hypertension   . Low back pain    chronic, reports hx DDD treated by chiropractor  . No blood products    Jehovah's Witness  . OAB (overactive bladder)   . Seasonal allergies   . Sickle cell trait ([place])   . Uterine fibroid     Patient Active Problem List   Diagnosis Date Noted  . Unilateral primary osteoarthritis, right hip 09/27/2018  . Diabetes mellitus without complication ([place]) 00/86/7619  . Hypertension associated with diabetes ([place]) 12/08/2017  . BMI 32.0-32.9,adult 12/08/2017  . Seasonal allergies 01/21/2015  . OAB (overactive bladder) 01/21/2015  . Hyperlipidemia associated with type 2 diabetes mellitus ([place]) 02/14/2014  . Obesity 12/19/2009  . LOW BACK  PAIN 03/18/2008  . DEPRESSION/ANXIETY 06/15/2007  . HYPERTENSION, BENIGN 06/15/2007    Past Surgical History:  Procedure Laterality Date  . BUNIONECTOMY Bilateral 1987  . CESAREAN SECTION    . Teeth extracted   01/14/2017  . TUBAL LIGATION    . TUMOR REMOVAL     behind left eye    OB History   No obstetric history on file.      Home Medications    Prior to Admission medications   Medication Sig Start Date End Date Taking? Authorizing Provider  amLODipine (NORVASC) 5 MG tablet Take 1 tablet (5 mg total) by mouth daily. 05/13/20   Waylan Busta C, PA-C  calcium-vitamin D (CALCIUM + D) 250-125 MG-UNIT per tablet Take 1 tablet by mouth daily. 02/14/14   Robbie Lis, MD  cyclobenzaprine (FLEXERIL) 5 MG tablet Take 1-2 tablets (5-10 mg total) by mouth 2 (two) times daily as needed for muscle spasms. 05/13/20   Alvetta Hidrogo C, PA-C  diclofenac sodium (VOLTAREN) 1 % GEL Apply 2 g topically 4 (four) times daily. 12/19/17   Leandrew Koyanagi, MD  Fe Fum-FA-B Cmp-C-Zn-Mg-Mn-Cu (HEMOCYTE PLUS) 106-1 MG CAPS Take 1 capsule by mouth daily. 02/14/14   Robbie Lis, MD  fluticasone [facility]) 50 MCG/ACT nasal spray Place 1 spray into both nostrils at bedtime. 04/21/20  Billie Ruddy, MD  naproxen (NAPROSYN) 500 MG tablet Take 1 tablet (500 mg total) by mouth 2 (two) times daily. 05/13/20   Lissette Schenk C, PA-C  OVER THE COUNTER MEDICATION Take 2 tablets by mouth daily. Biotin 5023mcg    [provider]  rosuvastatin (CRESTOR) 10 MG tablet Take 1 tablet (10 mg total) by mouth daily. 01/22/19   Billie Ruddy, MD  tolterodine (DETROL LA) 4 MG 24 hr capsule Take 1 capsule (4 mg total) by mouth daily. 04/21/20   Billie Ruddy, MD  venlafaxine (EFFEXOR) 100 MG tablet Take 1 tablet (100 mg total) by mouth 2 (two) times daily. To be taken with effexor 12.5 mg tab for a total dose of 112.5 mg BID. 04/21/20   Billie Ruddy, MD  venlafaxine (EFFEXOR) 25 MG tablet Take 0.5 tablets (12.5 mg  total) by mouth 2 (two) times daily. With Effexor 100 mg for a total dose of 112.5 mg BID. 04/21/20   Billie Ruddy, MD  lisinopril (ZESTRIL) 20 MG tablet Take 1 tablet (20 mg total) by mouth daily. 04/10/20 05/13/20  Billie Ruddy, MD    Family History Family History  Problem Relation Age of Onset  . Diabetes Father   . Hypertension Father   . Dementia Father        senile  . Other Mother        smoker  . Lung cancer Mother        2010  . Bipolar disorder Sister   . Schizophrenia Sister   . Schizophrenia Sister   . Bipolar disorder Sister   . Schizophrenia Sister   . Bipolar disorder Sister   . Bipolar disorder Daughter   . Colon cancer Neg Hx     Social History Social History   Tobacco Use  . Smoking status: Never Smoker  . Smokeless tobacco: Never Used  Vaping Use  . Vaping Use: Never used  Substance Use Topics  . Alcohol use: Yes    Comment: socially - rare; 2 drinks a few times per month  . Drug use: No     Allergies   Patient has no known allergies.   Review of Systems Review of Systems  Constitutional: Negative for fatigue and fever.  Eyes: Negative for visual disturbance.  Respiratory: Negative for shortness of breath.   Cardiovascular: Negative for chest pain.  Gastrointestinal: Negative for abdominal pain, nausea and vomiting.  Musculoskeletal: Positive for myalgias. Negative for arthralgias and joint swelling.  Skin: Negative for color change, rash and wound.  Neurological: Negative for dizziness, weakness, light-headedness and headaches.     Physical Exam Triage Vital Signs ED Triage Vitals  Enc Vitals Group     BP 05/13/20 1824 (!) 161/86     Pulse Rate 05/13/20 1824 84     Resp 05/13/20 1824 18     Temp 05/13/20 1824 97.8 F (36.6 C)     Temp Source 05/13/20 1824 Oral     SpO2 05/13/20 1824 100 %     Weight --      Height --      Head Circumference --      Peak Flow --      Pain Score 05/13/20 1823 7     Pain Loc --      Pain  Edu? --      Excl. in [place]? --    No data found.  Updated Vital Signs BP (!) 161/86 (BP Location: Right Arm)  Pulse 84   Temp 97.8 F (36.6 C) (Oral)   Resp 18   SpO2 100%   Visual Acuity Right Eye Distance:   Left Eye Distance:   Bilateral Distance:    Right Eye Near:   Left Eye Near:    Bilateral Near:     Physical Exam Vitals and nursing note reviewed.  Constitutional:      Appearance: She is well-developed.     Comments: No acute distress  HENT:     Head: Normocephalic and atraumatic.     Nose: Nose normal.  Eyes:     Conjunctiva/sclera: Conjunctivae normal.  Cardiovascular:     Rate and Rhythm: Normal rate.  Pulmonary:     Effort: Pulmonary effort is normal. No respiratory distress.  Abdominal:     General: There is no distension.  Musculoskeletal:        General: Normal range of motion.     Cervical back: Neck supple.     Comments: Right arm: No obvious swelling deformity or discoloration, -right shoulder nontender to palpation along clavicle, AC joint or scapular spine, full active range of motion of shoulder, strength 5/5 and equal bilaterally -Right biceps with mild tenderness to palpation, nontender to palpation posteriorly along triceps -Right elbow: Full active range of motion at elbow, strength 5/5 ankle bilaterally, nontender to palpation over bilateral epicondyles and olecranon process -Wrist and grip strength 5/vertical bilaterally, radial pulse 2+  Skin:    General: Skin is warm and dry.  Neurological:     Mental Status: She is alert and oriented to person, place, and time.      UC Treatments / Results  Labs (all labs ordered are listed, but only abnormal results are displayed) Labs Reviewed - No data to display  EKG   Radiology No results found.  Procedures Procedures (including critical care time)  Medications Ordered in UC Medications - No data to display  Initial Impression / Assessment and Plan / UC Course  I have reviewed  the triage vital signs and the nursing notes.  Pertinent labs & imaging results that were available during my care of the patient were reviewed by me and considered in my medical decision making (see chart for details).     Suspect more muscular etiology, unclear relation to vaccine.  More likely related to repetitive heavy lifting/nature of job.  Recommending consistent use of anti-inflammatories, muscle relaxers and rest.  Refilling amlodipine for blood pressure, follow-up with Dr. Volanda Napoleon and continue to monitor blood pressure.  No red flags.  Discussed strict return precautions. Patient verbalized understanding and is agreeable with plan.  Final Clinical Impressions(s) / UC Diagnoses   Final diagnoses:  Right arm pain  Essential hypertension     Discharge Instructions     Begin naprosyn twice daily for the next 7-10 days with food You may use flexeril as needed to help with pain. This is a muscle relaxer and causes sedation- please use only at bedtime or when you will be home and not have to drive/work Gentle stretching or arm  Your blood pressure was elevated today in clinic. Please be sure to take blood pressure medications as prescribed. Please monitor your blood pressure at home or when you go to a [facility]S/[facility]/Gym. Please follow up with your primary care doctor to recheck blood pressure and discuss any need for medication changes.   Please go to Emergency Room if you start to experience severe headache, vision changes, decreased urine production, chest pain, shortness of breath,  speech slurring, one sided weakness.    ED Prescriptions    Medication Sig Dispense Auth. Provider   naproxen (NAPROSYN) 500 MG tablet Take 1 tablet (500 mg total) by mouth 2 (two) times daily. 30 tablet Bostyn Kunkler C, PA-C   cyclobenzaprine (FLEXERIL) 5 MG tablet Take 1-2 tablets (5-10 mg total) by mouth 2 (two) times daily as needed for muscle spasms. 24 tablet Amro Winebarger C, PA-C    amLODipine (NORVASC) 5 MG tablet Take 1 tablet (5 mg total) by mouth daily. 90 tablet Johany Hansman, [place] C, PA-C     PDMP not reviewed this encounter.   Janith Lima, [place] 05/13/20 1948

## 2020-05-16 ENCOUNTER — Other Ambulatory Visit: Payer: Self-pay

## 2020-05-16 ENCOUNTER — Ambulatory Visit (INDEPENDENT_AMBULATORY_CARE_PROVIDER_SITE_OTHER): Payer: BC Managed Care – PPO | Admitting: Family Medicine

## 2020-05-16 ENCOUNTER — Other Ambulatory Visit: Payer: Self-pay | Admitting: Family Medicine

## 2020-05-16 ENCOUNTER — Encounter: Payer: Self-pay | Admitting: Family Medicine

## 2020-05-16 VITALS — BP 182/100 | HR 64 | Temp 97.9°F | Wt 204.0 lb

## 2020-05-16 DIAGNOSIS — I10 Essential (primary) hypertension: Secondary | ICD-10-CM | POA: Diagnosis not present

## 2020-05-16 DIAGNOSIS — E1169 Type 2 diabetes mellitus with other specified complication: Secondary | ICD-10-CM | POA: Diagnosis not present

## 2020-05-16 DIAGNOSIS — R400 Somnolence: Secondary | ICD-10-CM

## 2020-05-16 DIAGNOSIS — E1159 Type 2 diabetes mellitus with other circulatory complications: Secondary | ICD-10-CM | POA: Diagnosis not present

## 2020-05-16 DIAGNOSIS — E785 Hyperlipidemia, unspecified: Secondary | ICD-10-CM | POA: Diagnosis not present

## 2020-05-16 DIAGNOSIS — E119 Type 2 diabetes mellitus without complications: Secondary | ICD-10-CM | POA: Diagnosis not present

## 2020-05-16 LAB — POCT URINALYSIS DIPSTICK
Bilirubin, UA: NEGATIVE
Blood, UA: NEGATIVE
Glucose, UA: NEGATIVE
Ketones, UA: NEGATIVE
Leukocytes, UA: NEGATIVE
Nitrite, UA: NEGATIVE
Protein, UA: NEGATIVE
Spec Grav, UA: 1.025 (ref 1.010–1.025)
Urobilinogen, UA: 0.2 E.U./dL
pH, UA: 6 (ref 5.0–8.0)

## 2020-05-16 MED ORDER — IRBESARTAN 75 MG PO TABS: 75.0000 mg | ORAL_TABLET | Freq: Every day | ORAL | 3 refills | Status: DC

## 2020-05-16 MED ORDER — AMLODIPINE BESYLATE 10 MG PO TABS: 10.0000 mg | ORAL_TABLET | Freq: Every day | ORAL | 3 refills | Status: DC

## 2020-05-16 NOTE — Patient Instructions (Signed)
Managing Your Hypertension Hypertension is commonly called high blood pressure. This is when the force of your blood pressing against the walls of your arteries is too strong. Arteries are blood vessels that carry blood from your heart throughout your body. Hypertension forces the heart to work harder to pump blood, and may cause the arteries to become narrow or stiff. Having untreated or uncontrolled hypertension can cause heart attack, stroke, kidney disease, and other problems. What are blood pressure readings? A blood pressure reading consists of a higher number over a lower number. Ideally, your blood pressure should be below 120/80. The first ("top") number is called the systolic pressure. It is a measure of the pressure in your arteries as your heart beats. The second ("bottom") number is called the diastolic pressure. It is a measure of the pressure in your arteries as the heart relaxes. What does my blood pressure reading mean? Blood pressure is classified into four stages. Based on your blood pressure reading, your health care provider may use the following stages to determine what type of treatment you need, if any. Systolic pressure and diastolic pressure are measured in a unit called mm Hg. Normal  Systolic pressure: below 120.  Diastolic pressure: below 80. Elevated  Systolic pressure: 120-129.  Diastolic pressure: below 80. Hypertension stage 1  Systolic pressure: 130-139.  Diastolic pressure: 80-89. Hypertension stage 2  Systolic pressure: 140 or above.  Diastolic pressure: 90 or above. What health risks are associated with hypertension? Managing your hypertension is an important responsibility. Uncontrolled hypertension can lead to:  A heart attack.  A stroke.  A weakened blood vessel (aneurysm).  Heart failure.  Kidney damage.  Eye damage.  Metabolic syndrome.  Memory and concentration problems. What changes can I make to manage my  hypertension? Hypertension can be managed by making lifestyle changes and possibly by taking medicines. Your health care provider will help you make a plan to bring your blood pressure within a normal range. Eating and drinking   Eat a diet that is high in fiber and potassium, and low in salt (sodium), added sugar, and fat. An example eating plan is called the DASH (Dietary Approaches to Stop Hypertension) diet. To eat this way: ? Eat plenty of fresh fruits and vegetables. Try to fill half of your plate at each meal with fruits and vegetables. ? Eat whole grains, such as whole wheat pasta, [place] rice, or whole grain bread. Fill about one quarter of your plate with whole grains. ? Eat low-fat diary products. ? Avoid fatty cuts of meat, processed or cured meats, and poultry with skin. Fill about one quarter of your plate with lean proteins such as fish, chicken without skin, beans, eggs, and tofu. ? Avoid premade and processed foods. These tend to be higher in sodium, added sugar, and fat.  Reduce your daily sodium intake. Most people with hypertension should eat less than 1,500 mg of sodium a day.  Limit alcohol intake to no more than 1 drink a day for nonpregnant women and 2 drinks a day for men. One drink equals 12 oz of beer, 5 oz of wine, or 1 oz of hard liquor. Lifestyle  Work with your health care provider to maintain a healthy body weight, or to lose weight. Ask what an ideal weight is for you.  Get at least 30 minutes of exercise that causes your heart to beat faster (aerobic exercise) most days of the week. Activities may include walking, swimming, or biking.  Include exercise   to strengthen your muscles (resistance exercise), such as weight lifting, as part of your weekly exercise routine. Try to do these types of exercises for 30 minutes at least 3 days a week.  Do not use any products that contain nicotine or tobacco, such as cigarettes and e-cigarettes. If you need help quitting,  ask your health care provider.  Control any long-term (chronic) conditions you have, such as high cholesterol or diabetes. Monitoring  Monitor your blood pressure at home as told by your health care provider. Your personal target blood pressure may vary depending on your medical conditions, your age, and other factors.  Have your blood pressure checked regularly, as often as told by your health care provider. Working with your health care provider  Review all the medicines you take with your health care provider because there may be side effects or interactions.  Talk with your health care provider about your diet, exercise habits, and other lifestyle factors that may be contributing to hypertension.  Visit your health care provider regularly. Your health care provider can help you create and adjust your plan for managing hypertension. Will I need medicine to control my blood pressure? Your health care provider may prescribe medicine if lifestyle changes are not enough to get your blood pressure under control, and if:  Your systolic blood pressure is 130 or higher.  Your diastolic blood pressure is 80 or higher. Take medicines only as told by your health care provider. Follow the directions carefully. Blood pressure medicines must be taken as prescribed. The medicine does not work as well when you skip doses. Skipping doses also puts you at risk for problems. Contact a health care provider if:  You think you are having a reaction to medicines you have taken.  You have repeated (recurrent) headaches.  You feel dizzy.  You have swelling in your ankles.  You have trouble with your vision. Get help right away if:  You develop a severe headache or confusion.  You have unusual weakness or numbness, or you feel faint.  You have severe pain in your chest or abdomen.  You vomit repeatedly.  You have trouble breathing. Summary  Hypertension is when the force of blood pumping  through your arteries is too strong. If this condition is not controlled, it may put you at risk for serious complications.  Your personal target blood pressure may vary depending on your medical conditions, your age, and other factors. For most people, a normal blood pressure is less than 120/80.  Hypertension is managed by lifestyle changes, medicines, or both. Lifestyle changes include weight loss, eating a healthy, low-sodium diet, exercising more, and limiting alcohol. This information is not intended to replace advice given to you by your health care provider. Make sure you discuss any questions you have with your health care provider. Document Revised: 01/19/2019 Document Reviewed: 08/25/2016 Elsevier Patient Education  [place] DASH stands for "Dietary Approaches to Stop Hypertension." The DASH eating plan is a healthy eating plan that has been shown to reduce high blood pressure (hypertension). It may also reduce your risk for type 2 diabetes, heart disease, and stroke. The DASH eating plan may also help with weight loss. What are tips for following this plan?  General guidelines  Avoid eating more than 2,300 mg (milligrams) of salt (sodium) a day. If you have hypertension, you may need to reduce your sodium intake to 1,500 mg a day.  Limit alcohol intake to no more than  1 drink a day for nonpregnant women and 2 drinks a day for men. One drink equals 12 oz of beer, 5 oz of wine, or 1 oz of hard liquor.  Work with your health care provider to maintain a healthy body weight or to lose weight. Ask what an ideal weight is for you.  Get at least 30 minutes of exercise that causes your heart to beat faster (aerobic exercise) most days of the week. Activities may include walking, swimming, or biking.  Work with your health care provider or diet and nutrition specialist (dietitian) to adjust your eating plan to your individual calorie needs. Reading food  labels   Check food labels for the amount of sodium per serving. Choose foods with less than 5 percent of the Daily Value of sodium. Generally, foods with less than 300 mg of sodium per serving fit into this eating plan.  To find whole grains, look for the word "whole" as the first word in the ingredient list. Shopping  Buy products labeled as "low-sodium" or "no salt added."  Buy fresh foods. Avoid canned foods and premade or frozen meals. Cooking  Avoid adding salt when cooking. Use salt-free seasonings or herbs instead of table salt or sea salt. Check with your health care provider or pharmacist before using salt substitutes.  Do not fry foods. Cook foods using healthy methods such as baking, boiling, grilling, and broiling instead.  Cook with heart-healthy oils, such as olive, canola, soybean, or sunflower oil. Meal planning  Eat a balanced diet that includes: ? 5 or more servings of fruits and vegetables each day. At each meal, try to fill half of your plate with fruits and vegetables. ? Up to 6-8 servings of whole grains each day. ? Less than 6 oz of lean meat, poultry, or fish each day. A 3-oz serving of meat is about the same size as a deck of cards. One egg equals 1 oz. ? 2 servings of low-fat dairy each day. ? A serving of nuts, seeds, or beans 5 times each week. ? Heart-healthy fats. Healthy fats called Omega-3 fatty acids are found in foods such as flaxseeds and coldwater fish, like sardines, salmon, and mackerel.  Limit how much you eat of the following: ? Canned or prepackaged foods. ? Food that is high in trans fat, such as fried foods. ? Food that is high in saturated fat, such as fatty meat. ? Sweets, desserts, sugary drinks, and other foods with added sugar. ? Full-fat dairy products.  Do not salt foods before eating.  Try to eat at least 2 vegetarian meals each week.  Eat more home-cooked food and less restaurant, buffet, and fast food.  When eating at a  restaurant, ask that your food be prepared with less salt or no salt, if possible. What foods are recommended? The items listed may not be a complete list. Talk with your dietitian about what dietary choices are best for you. Grains Whole-grain or whole-wheat bread. Whole-grain or whole-wheat pasta. [place] rice. [place]. Bulgur. Whole-grain and low-sodium cereals. Pita bread. Low-fat, low-sodium crackers. Whole-wheat flour tortillas. Vegetables Fresh or frozen vegetables (raw, steamed, roasted, or grilled). Low-sodium or reduced-sodium tomato and vegetable juice. Low-sodium or reduced-sodium tomato sauce and tomato paste. Low-sodium or reduced-sodium canned vegetables. Fruits All fresh, dried, or frozen fruit. Canned fruit in natural juice (without added sugar). Meat and other protein foods Skinless chicken or [place]. Ground chicken or [place]. Pork with fat trimmed off. Fish and seafood. Egg whites.  Dried beans, peas, or lentils. Unsalted nuts, nut butters, and seeds. Unsalted canned beans. Lean cuts of beef with fat trimmed off. Low-sodium, lean deli meat. Dairy Low-fat (1%) or fat-free (skim) milk. Fat-free, low-fat, or reduced-fat cheeses. Nonfat, low-sodium ricotta or cottage cheese. Low-fat or nonfat yogurt. Low-fat, low-sodium cheese. Fats and oils Soft margarine without trans fats. Vegetable oil. Low-fat, reduced-fat, or light mayonnaise and salad dressings (reduced-sodium). Canola, safflower, olive, soybean, and sunflower oils. Avocado. Seasoning and other foods Herbs. Spices. Seasoning mixes without salt. Unsalted popcorn and pretzels. Fat-free sweets. What foods are not recommended? The items listed may not be a complete list. Talk with your dietitian about what dietary choices are best for you. Grains Baked goods made with fat, such as croissants, muffins, or some breads. Dry pasta or rice meal packs. Vegetables Creamed or fried vegetables. Vegetables in a cheese sauce.  Regular canned vegetables (not low-sodium or reduced-sodium). Regular canned tomato sauce and paste (not low-sodium or reduced-sodium). Regular tomato and vegetable juice (not low-sodium or reduced-sodium). Angie Fava. Olives. Fruits Canned fruit in a light or heavy syrup. Fried fruit. Fruit in cream or butter sauce. Meat and other protein foods Fatty cuts of meat. Ribs. Fried meat. Berniece Salines. Sausage. Bologna and other processed lunch meats. Salami. Fatback. Hotdogs. Bratwurst. Salted nuts and seeds. Canned beans with added salt. Canned or smoked fish. Whole eggs or egg yolks. Chicken or [place] with skin. Dairy Whole or 2% milk, cream, and half-and-half. Whole or full-fat cream cheese. Whole-fat or sweetened yogurt. Full-fat cheese. Nondairy creamers. Whipped toppings. Processed cheese and cheese spreads. Fats and oils Butter. Stick margarine. Lard. Shortening. Ghee. Bacon fat. Tropical oils, such as coconut, palm kernel, or palm oil. Seasoning and other foods Salted popcorn and pretzels. Onion salt, garlic salt, seasoned salt, table salt, and sea salt. Worcestershire sauce. Tartar sauce. Barbecue sauce. Teriyaki sauce. Soy sauce, including reduced-sodium. Steak sauce. Canned and packaged gravies. Fish sauce. Oyster sauce. Cocktail sauce. Horseradish that you find on the shelf. Ketchup. Mustard. Meat flavorings and tenderizers. Bouillon cubes. Hot sauce and Tabasco sauce. Premade or packaged marinades. Premade or packaged taco seasonings. Relishes. Regular salad dressings. Where to find more information:  National Heart, Lung, and [place]: https://wilson-eaton.com/  American Heart Association: www.heart.org Summary  The DASH eating plan is a healthy eating plan that has been shown to reduce high blood pressure (hypertension). It may also reduce your risk for type 2 diabetes, heart disease, and stroke.  With the DASH eating plan, you should limit salt (sodium) intake to 2,300 mg a day. If you have  hypertension, you may need to reduce your sodium intake to 1,500 mg a day.  When on the DASH eating plan, aim to eat more fresh fruits and vegetables, whole grains, lean proteins, low-fat dairy, and heart-healthy fats.  Work with your health care provider or diet and nutrition specialist (dietitian) to adjust your eating plan to your individual calorie needs. This information is not intended to replace advice given to you by your health care provider. Make sure you discuss any questions you have with your health care provider. Document Revised: 09/09/2017 Document Reviewed: 09/20/2016 Elsevier Patient Education  2020 [place].  Diabetes Basics  Diabetes (diabetes mellitus) is a long-term (chronic) disease. It occurs when the body does not properly use sugar (glucose) that is released from food after you eat. Diabetes may be caused by one or both of these problems:  Your pancreas does not make enough of a hormone called insulin.  Your  body does not react in a normal way to insulin that it makes. Insulin lets sugars (glucose) go into cells in your body. This gives you energy. If you have diabetes, sugars cannot get into cells. This causes high blood sugar (hyperglycemia). Follow these instructions at home: How is diabetes treated? You may need to take insulin or other diabetes medicines daily to keep your blood sugar in balance. Take your diabetes medicines every day as told by your doctor. List your diabetes medicines here: Diabetes medicines  Name of medicine: ______________________________ ? Amount (dose): _______________ Time (a.m./p.m.): _______________ Notes: ___________________________________  Name of medicine: ______________________________ ? Amount (dose): _______________ Time (a.m./p.m.): _______________ Notes: ___________________________________  Name of medicine: ______________________________ ? Amount (dose): _______________ Time (a.m./p.m.): _______________ Notes:  ___________________________________ If you use insulin, you will learn how to give yourself insulin by injection. You may need to adjust the amount based on the food that you eat. List the types of insulin you use here: Insulin  Insulin type: ______________________________ ? Amount (dose): _______________ Time (a.m./p.m.): _______________ Notes: ___________________________________  Insulin type: ______________________________ ? Amount (dose): _______________ Time (a.m./p.m.): _______________ Notes: ___________________________________  Insulin type: ______________________________ ? Amount (dose): _______________ Time (a.m./p.m.): _______________ Notes: ___________________________________  Insulin type: ______________________________ ? Amount (dose): _______________ Time (a.m./p.m.): _______________ Notes: ___________________________________  Insulin type: ______________________________ ? Amount (dose): _______________ Time (a.m./p.m.): _______________ Notes: ___________________________________ How do I manage my blood sugar?  Check your blood sugar levels using a blood glucose monitor as directed by your doctor. Your doctor will set treatment goals for you. Generally, you should have these blood sugar levels:  Before meals (preprandial): 80-130 mg/dL (4.4-7.2 mmol/L).  After meals (postprandial): below 180 mg/dL (10 mmol/L).  A1c level: less than 7%. Write down the times that you will check your blood sugar levels: Blood sugar checks  Time: _______________ Notes: ___________________________________  Time: _______________ Notes: ___________________________________  Time: _______________ Notes: ___________________________________  Time: _______________ Notes: ___________________________________  Time: _______________ Notes: ___________________________________  Time: _______________ Notes: ___________________________________  What do I need to know about low blood sugar? Low  blood sugar is called hypoglycemia. This is when blood sugar is at or below 70 mg/dL (3.9 mmol/L). Symptoms may include:  Feeling: ? Hungry. ? Worried or nervous (anxious). ? Sweaty and clammy. ? Confused. ? Dizzy. ? Sleepy. ? Sick to your stomach (nauseous).  Having: ? A fast heartbeat. ? A headache. ? A change in your vision. ? Tingling or no feeling (numbness) around the mouth, lips, or tongue. ? Jerky movements that you cannot control (seizure).  Having trouble with: ? Moving (coordination). ? Sleeping. ? Passing out (fainting). ? Getting upset easily (irritability). Treating low blood sugar To treat low blood sugar, eat or drink something sugary right away. If you can think clearly and swallow safely, follow the 15:15 rule:  Take 15 grams of a fast-acting carb (carbohydrate). Talk with your doctor about how much you should take.  Some fast-acting carbs are: ? Sugar tablets (glucose pills). Take 3-4 glucose pills. ? 6-8 pieces of hard candy. ? 4-6 oz (120-150 mL) of fruit juice. ? 4-6 oz (120-150 mL) of regular (not diet) soda. ? 1 Tbsp (15 mL) honey or sugar.  Check your blood sugar 15 minutes after you take the carb.  If your blood sugar is still at or below 70 mg/dL (3.9 mmol/L), take 15 grams of a carb again.  If your blood sugar does not go above 70 mg/dL (3.9 mmol/L) after 3 tries, get help right away.  After your  blood sugar goes back to normal, eat a meal or a snack within 1 hour. Treating very low blood sugar If your blood sugar is at or below 54 mg/dL (3 mmol/L), you have very low blood sugar (severe hypoglycemia). This is an emergency. Do not wait to see if the symptoms will go away. Get medical help right away. Call your local emergency services (911 in the [place] Do not drive yourself to the hospital. Questions to ask your health care provider  Do I need to meet with a diabetes educator?  What equipment will I need to care for myself at home?  What  diabetes medicines do I need? When should I take them?  How often do I need to check my blood sugar?  What number can I call if I have questions?  When is my next doctor's visit?  Where can I find a support group for people with diabetes? Where to find more information  American Diabetes Association: www.diabetes.org  American Association of Diabetes Educators: www.diabeteseducator.org/patient-resources Contact a doctor if:  Your blood sugar is at or above 240 mg/dL (13.3 mmol/L) for 2 days in a row.  You have been sick or have had a fever for 2 days or more, and you are not getting better.  You have any of these problems for more than 6 hours: ? You cannot eat or drink. ? You feel sick to your stomach (nauseous). ? You throw up (vomit). ? You have watery poop (diarrhea). Get help right away if:  Your blood sugar is lower than 54 mg/dL (3 mmol/L).  You get confused.  You have trouble: ? Thinking clearly. ? Breathing. Summary  Diabetes (diabetes mellitus) is a long-term (chronic) disease. It occurs when the body does not properly use sugar (glucose) that is released from food after digestion.  Take insulin and diabetes medicines as told.  Check your blood sugar every day, as often as told.  Keep all follow-up visits as told by your doctor. This is important. This information is not intended to replace advice given to you by your health care provider. Make sure you discuss any questions you have with your health care provider. Document Revised: 06/20/2019 Document Reviewed: 12/30/2017 Elsevier Patient Education  [place].

## 2020-05-16 NOTE — Progress Notes (Addendum)
Subjective:    Patient ID: Rachel Fields, female    DOB: 04/24/1956, 64 y.o.   MRN: [id number]  No chief complaint on file. Pt accompanied by her daughter and grandson.  HPI Patient is a 64 yo female with pmh sig for HTN, DM 2, OA right ankle, OAB, history of depression anxiety, seasonal allergies who was seen today for f/u.  Pt seen in ED on 05/13/2020 for R arm pain and HTN.  Pt out of bp meds x a while.  Pt stopped taking lisinopril as she did not feel it was doing anything.  Pt given Naprosyn for arm pain and Norvasc 5 mg refilled.  Pt's bp elevated at 215/104 this am.  Pt endorses mild HA and feeling tired during the day.  Eating fried fish, chicken, veggies, a little pork, eggs.    Past Medical History:  Diagnosis Date  . Allergy   . Anxiety   . Anxiety and depression   . Arthritis   . Depression   . HTN (hypertension)   . Hx of iron deficiency anemia    reports [place] trait; reports on iron her whole life  . Hypertension   . Low back pain    chronic, reports hx DDD treated by chiropractor  . No blood products    Jehovah's Witness  . OAB (overactive bladder)   . Seasonal allergies   . Sickle cell trait ([place])   . Uterine fibroid     No Known Allergies  ROS General: Denies fever, chills, night sweats, changes in weight, changes in appetite  + insomnia, daytime somnolence HEENT: Denies ear pain, changes in vision, rhinorrhea, sore throat  +HA CV: Denies CP, palpitations, SOB, orthopnea Pulm: Denies SOB, cough, wheezing GI: Denies abdominal pain, nausea, vomiting, diarrhea, constipation GU: Denies dysuria, hematuria, frequency, vaginal discharge Msk: Denies muscle cramps, joint pains Neuro: Denies weakness, numbness, tingling Skin: Denies rashes, bruising Psych: Denies depression, anxiety, hallucinations      Objective:    Blood pressure (!) 182/100, pulse 64, temperature 97.9 F (36.6 C), temperature source Oral, weight 204 lb (92.5 kg), SpO2 95 %.  BP recheck  170/80  Gen. Pleasant, well-nourished, in no distress, normal affect   HEENT: [place]/AT, face symmetric, conjunctiva clear, no scleral icterus, PERRLA, EOMI, nares patent without drainage. Neck: No JVD, no thyromegaly, no carotid bruits Lungs: no accessory muscle use, CTAB, no wheezes or rales Cardiovascular: RRR, no m/r/g, no peripheral edema Neuro:  A&Ox3, CN II-XII intact, normal gait Skin:  Warm, no lesions/ rash   Wt Readings from Last 3 Encounters:  01/18/19 206 lb (93.4 kg)  12/08/17 208 lb 14.4 oz (94.8 kg)  11/28/17 208 lb 3.2 oz (94.4 kg)    Lab Results  Component Value Date   WBC 6.8 01/18/2019   HGB 11.8 (L) 01/18/2019   HCT 38.0 01/18/2019   PLT 248 01/18/2019   GLUCOSE 170 (H) 01/18/2019   CHOL 233 (H) 11/28/2017   TRIG 100.0 11/28/2017   HDL 56.20 11/28/2017   LDLCALC 157 (H) 11/28/2017   ALT 48 (H) 04/26/2011   AST 37 04/26/2011   NA 142 01/18/2019   K 3.5 01/18/2019   CL 105 01/18/2019   CREATININE 0.94 01/18/2019   BUN 15 01/18/2019   CO2 27 01/18/2019   TSH 1.337 02/14/2014   HGBA1C 6.9 (H) 11/28/2017   MICROALBUR 2.98 (H) 06/12/2008    Assessment/Plan:  Essential hypertension  -Uncontrolled -Lost to follow-up -BP 215/104 at patient's home.  Initial BP reading in  office 182/100.  Recheck 170/80 -Patient strongly encouraged to restart medications. -Discussed discontinuing lisinopril 20 mg as patient did not feel like it effective -We will increase dose of Norvasc from 5 mg to 10 mg daily -We will also add irbesartan 75 mg daily -Patient encouraged to check BP at home and keep a log to bring with you to clinic. -We will obtain sleep study to evaluate for possible OSA -We will obtain labs -Discussed strict precautions - Plan: amLODipine (NORVASC) 10 MG tablet, irbesartan (AVAPRO) 75 MG tablet, POCT urinalysis dipstick, CBC with Differential/Platelets, CMP with eGFR([facility])  Type 2 diabetes mellitus with other specified complication, without  long-term current use of insulin ([place]) -Discussed lifestyle modifications. -Not currently on medication -Last hemoglobin A1c 6.9% on 11/28/2017 - Plan: Hemoglobin A1c, Lipid panel, Microalbumin/Creatinine Ratio, Urine, Microalbumin/Creatinine Ratio, Urine  Daytime somnolence  -Discussed sleep apnea can cause elevation in BP. - Plan: PSG Sleep Study, Hemoglobin A1c, T4, free, TSH  F/u in 2 wks for HTN  Grier Mitts, MD

## 2020-05-17 LAB — LIPID PANEL
Cholesterol: 224 mg/dL — ABNORMAL HIGH (ref <200)
HDL: 51 mg/dL (ref >=50)
LDL Cholesterol (Calc): 136 mg/dL (calc) — ABNORMAL HIGH
Non-HDL Cholesterol (Calc): 173 mg/dL (calc) — ABNORMAL HIGH (ref <130)
Total CHOL/HDL Ratio: 4.4 (calc) (ref <5.0)
Triglycerides: 230 mg/dL — ABNORMAL HIGH (ref <150)

## 2020-05-17 LAB — COMPLETE METABOLIC PANEL WITH GFR
AG Ratio: 1.6 (calc) (ref 1.0–2.5)
ALT: 28 U/L (ref 6–29)
AST: 23 U/L (ref 10–35)
Albumin: 4.1 g/dL (ref 3.6–5.1)
Alkaline phosphatase (APISO): 99 U/L (ref 37–153)
BUN: 11 mg/dL (ref 7–25)
CO2: 29 mmol/L (ref 20–32)
Calcium: 9.4 mg/dL (ref 8.6–10.4)
Chloride: 101 mmol/L (ref 98–110)
Creat: 0.97 mg/dL (ref 0.50–0.99)
GFR, Est African American: 72 mL/min/{1.73_m2} (ref >=60)
GFR, Est Non African American: 62 mL/min/{1.73_m2} (ref >=60)
Globulin: 2.5 g/dL (calc) (ref 1.9–3.7)
Glucose, Bld: 182 mg/dL — ABNORMAL HIGH (ref 65–99)
Potassium: 3.8 mmol/L (ref 3.5–5.3)
Sodium: 139 mmol/L (ref 135–146)
Total Bilirubin: 0.2 mg/dL (ref 0.2–1.2)
Total Protein: 6.6 g/dL (ref 6.1–8.1)

## 2020-05-17 LAB — MICROALBUMIN / CREATININE URINE RATIO
Creatinine, Urine: 159 mg/dL (ref 20–275)
Microalb Creat Ratio: 9 mcg/mg creat (ref <30)
Microalb, Ur: 1.4 mg/dL

## 2020-05-17 LAB — CBC WITH DIFFERENTIAL/PLATELET
Absolute Monocytes: 548 cells/uL (ref 200–950)
Basophils Absolute: 33 cells/uL (ref 0–200)
Basophils Relative: 0.5 %
Eosinophils Absolute: 191 cells/uL (ref 15–500)
Eosinophils Relative: 2.9 %
HCT: 37.8 % (ref 35.0–45.0)
Hemoglobin: 12.5 g/dL (ref 11.7–15.5)
Lymphs Abs: 2006 cells/uL (ref 850–3900)
MCH: 26 pg — ABNORMAL LOW (ref 27.0–33.0)
MCHC: 33.1 g/dL (ref 32.0–36.0)
MCV: 78.6 fL — ABNORMAL LOW (ref 80.0–100.0)
MPV: 9.7 fL (ref 7.5–12.5)
Monocytes Relative: 8.3 %
Neutro Abs: 3821 cells/uL (ref 1500–7800)
Neutrophils Relative %: 57.9 %
Platelets: 271 10*3/uL (ref 140–400)
RBC: 4.81 10*6/uL (ref 3.80–5.10)
RDW: 13.7 % (ref 11.0–15.0)
Total Lymphocyte: 30.4 %
WBC: 6.6 10*3/uL (ref 3.8–10.8)

## 2020-05-17 LAB — HEMOGLOBIN A1C
Hgb A1c MFr Bld: 7.7 % of total Hgb — ABNORMAL HIGH (ref <5.7)
Mean Plasma Glucose: 174 (calc)
eAG (mmol/L): 9.7 (calc)

## 2020-05-17 LAB — TSH: TSH: 1.29 mIU/L (ref 0.40–4.50)

## 2020-05-17 LAB — T4, FREE: Free T4: 1 ng/dL (ref 0.8–1.8)

## 2020-05-21 ENCOUNTER — Telehealth: Payer: Self-pay | Admitting: Family Medicine

## 2020-05-21 NOTE — Telephone Encounter (Signed)
Please advise if ok to send Rx

## 2020-05-21 NOTE — Telephone Encounter (Signed)
Pt stated when she was here last PCP was suppose to send in a medication refill   Medication:  Detrol La  Pharmacy:  [id number] - [place], [place] Phone:  [phone]  Fax:  [phone]

## 2020-05-23 ENCOUNTER — Encounter: Payer: Self-pay | Admitting: Family Medicine

## 2020-05-27 NOTE — Telephone Encounter (Signed)
Med was not discussed at last OFV with this provider.  Pt to be seen later this wk.  Can address then.

## 2020-05-28 ENCOUNTER — Other Ambulatory Visit: Payer: Self-pay | Admitting: *Deleted

## 2020-05-28 DIAGNOSIS — N3281 Overactive bladder: Secondary | ICD-10-CM

## 2020-05-28 MED ORDER — TOLTERODINE TARTRATE ER 4 MG PO CP24: 4.0000 mg | ORAL_CAPSULE | Freq: Every day | ORAL | 0 refills | Status: DC

## 2020-05-28 NOTE — Telephone Encounter (Signed)
Patient scheduled for 05/29/2020.

## 2020-05-29 ENCOUNTER — Ambulatory Visit: Payer: BC Managed Care – PPO | Admitting: Family Medicine

## 2020-05-29 DIAGNOSIS — Z0289 Encounter for other administrative examinations: Secondary | ICD-10-CM

## 2020-06-11 ENCOUNTER — Emergency Department (HOSPITAL_BASED_OUTPATIENT_CLINIC_OR_DEPARTMENT_OTHER)
Admission: EM | Admit: 2020-06-11 | Discharge: 2020-06-11 | Disposition: A | Discharge status: Home / Self Care | Payer: BC Managed Care – PPO | Attending: Emergency Medicine | Admitting: Emergency Medicine

## 2020-06-11 ENCOUNTER — Encounter (HOSPITAL_BASED_OUTPATIENT_CLINIC_OR_DEPARTMENT_OTHER): Payer: Self-pay | Admitting: *Deleted

## 2020-06-11 ENCOUNTER — Other Ambulatory Visit: Payer: Self-pay

## 2020-06-11 ENCOUNTER — Emergency Department (HOSPITAL_BASED_OUTPATIENT_CLINIC_OR_DEPARTMENT_OTHER): Payer: BC Managed Care – PPO

## 2020-06-11 DIAGNOSIS — S161XXA Strain of muscle, fascia and tendon at neck level, initial encounter: Secondary | ICD-10-CM

## 2020-06-11 DIAGNOSIS — I10 Essential (primary) hypertension: Secondary | ICD-10-CM | POA: Insufficient documentation

## 2020-06-11 DIAGNOSIS — S39012A Strain of muscle, fascia and tendon of lower back, initial encounter: Secondary | ICD-10-CM | POA: Diagnosis not present

## 2020-06-11 DIAGNOSIS — Y999 Unspecified external cause status: Secondary | ICD-10-CM | POA: Diagnosis not present

## 2020-06-11 DIAGNOSIS — Y929 Unspecified place or not applicable: Secondary | ICD-10-CM | POA: Diagnosis not present

## 2020-06-11 DIAGNOSIS — M545 Low back pain: Secondary | ICD-10-CM | POA: Diagnosis not present

## 2020-06-11 DIAGNOSIS — Y939 Activity, unspecified: Secondary | ICD-10-CM | POA: Diagnosis not present

## 2020-06-11 DIAGNOSIS — S3992XA Unspecified injury of lower back, initial encounter: Secondary | ICD-10-CM | POA: Diagnosis not present

## 2020-06-11 DIAGNOSIS — E119 Type 2 diabetes mellitus without complications: Secondary | ICD-10-CM | POA: Insufficient documentation

## 2020-06-11 DIAGNOSIS — S199XXA Unspecified injury of neck, initial encounter: Secondary | ICD-10-CM | POA: Diagnosis not present

## 2020-06-11 DIAGNOSIS — R52 Pain, unspecified: Secondary | ICD-10-CM | POA: Diagnosis not present

## 2020-06-11 DIAGNOSIS — M542 Cervicalgia: Secondary | ICD-10-CM | POA: Diagnosis not present

## 2020-06-11 MED ORDER — CYCLOBENZAPRINE HCL 5 MG PO TABS: 5.0000 mg | ORAL_TABLET | Freq: Two times a day (BID) | ORAL | 0 refills | Status: AC | PRN

## 2020-06-11 MED ORDER — NAPROXEN 250 MG PO TABS
500.0000 mg | ORAL_TABLET | Freq: Once | ORAL | Status: AC
  Administered 2020-06-11: 500 mg via ORAL
  Filled 2020-06-11: qty 2

## 2020-06-11 MED ORDER — NAPROXEN 500 MG PO TABS
500.0000 mg | ORAL_TABLET | Freq: Two times a day (BID) | ORAL | 0 refills | Status: DC | PRN
Start: 2020-06-11 — End: 2020-09-18
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
## 2020-06-22 ENCOUNTER — Other Ambulatory Visit: Payer: Self-pay | Admitting: Family Medicine

## 2020-06-22 DIAGNOSIS — N3281 Overactive bladder: Secondary | ICD-10-CM

## 2020-06-23 NOTE — Telephone Encounter (Signed)
Pt LOV was on 05/16/2020 and last refill was done on 05/28/2020 for 30 tablets with no refill, please advise

## 2020-06-24 ENCOUNTER — Other Ambulatory Visit: Payer: Self-pay

## 2020-06-25 ENCOUNTER — Ambulatory Visit (INDEPENDENT_AMBULATORY_CARE_PROVIDER_SITE_OTHER): Payer: BC Managed Care – PPO | Admitting: Family Medicine

## 2020-06-25 ENCOUNTER — Encounter: Payer: Self-pay | Admitting: Family Medicine

## 2020-06-25 DIAGNOSIS — S29012D Strain of muscle and tendon of back wall of thorax, subsequent encounter: Secondary | ICD-10-CM | POA: Diagnosis not present

## 2020-06-25 DIAGNOSIS — I10 Essential (primary) hypertension: Secondary | ICD-10-CM | POA: Diagnosis not present

## 2020-06-25 DIAGNOSIS — M5441 Lumbago with sciatica, right side: Secondary | ICD-10-CM

## 2020-06-25 MED ORDER — IRBESARTAN 150 MG PO TABS: 150.0000 mg | ORAL_TABLET | Freq: Every day | ORAL | 3 refills | Status: DC

## 2020-06-25 MED ORDER — MELOXICAM 7.5 MG PO TABS: 7.5000 mg | ORAL_TABLET | Freq: Every day | ORAL | 0 refills | Status: AC
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
## 2020-06-25 NOTE — Patient Instructions (Signed)
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  [place].

## 2020-07-01 ENCOUNTER — Telehealth: Payer: Self-pay | Admitting: Family Medicine

## 2020-07-01 NOTE — Telephone Encounter (Signed)
Pt call and need a refill on venlafaxine (EFFEXOR) 100 MG tablet and tolterodine (DETROL LA) 4 MG 24 hr capsule sent to  [id number] [place], [place] [facility] [place] Phone:  [phone]  Fax:  [phone]

## 2020-07-02 ENCOUNTER — Other Ambulatory Visit: Payer: Self-pay

## 2020-07-02 DIAGNOSIS — N3281 Overactive bladder: Secondary | ICD-10-CM

## 2020-07-02 DIAGNOSIS — F32A Depression, unspecified: Secondary | ICD-10-CM

## 2020-07-02 DIAGNOSIS — F419 Anxiety disorder, unspecified: Secondary | ICD-10-CM

## 2020-07-02 MED ORDER — TOLTERODINE TARTRATE ER 4 MG PO CP24: 4.0000 mg | ORAL_CAPSULE | Freq: Every day | ORAL | 0 refills | Status: DC

## 2020-07-02 MED ORDER — VENLAFAXINE HCL 100 MG PO TABS: 100.0000 mg | ORAL_TABLET | Freq: Two times a day (BID) | ORAL | 0 refills | Status: DC

## 2020-07-08 NOTE — Telephone Encounter (Signed)
Both Rx have already been refilled on 07/02/2020

## 2020-07-18 ENCOUNTER — Ambulatory Visit: Payer: BC Managed Care – PPO | Admitting: Orthopaedic Surgery

## 2020-07-23 ENCOUNTER — Ambulatory Visit (INDEPENDENT_AMBULATORY_CARE_PROVIDER_SITE_OTHER): Payer: BC Managed Care – PPO | Admitting: Orthopaedic Surgery

## 2020-07-23 DIAGNOSIS — G8929 Other chronic pain: Secondary | ICD-10-CM

## 2020-07-23 DIAGNOSIS — M545 Low back pain, unspecified: Secondary | ICD-10-CM

## 2020-07-23 DIAGNOSIS — M542 Cervicalgia: Secondary | ICD-10-CM | POA: Diagnosis not present

## 2020-07-23 MED ORDER — METAXALONE 800 MG PO TABS: 800.0000 mg | ORAL_TABLET | Freq: Three times a day (TID) | ORAL | 0 refills | Status: DC

## 2020-07-23 MED ORDER — PREDNISONE 10 MG (21) PO TBPK: ORAL_TABLET | ORAL | 0 refills | Status: DC
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
## 2020-08-07 ENCOUNTER — Other Ambulatory Visit: Payer: Self-pay | Admitting: Family Medicine

## 2020-08-07 DIAGNOSIS — N3281 Overactive bladder: Secondary | ICD-10-CM

## 2020-08-08 ENCOUNTER — Other Ambulatory Visit: Payer: Self-pay

## 2020-08-08 ENCOUNTER — Telehealth: Payer: Self-pay | Admitting: Family Medicine

## 2020-08-08 ENCOUNTER — Other Ambulatory Visit: Payer: Self-pay | Admitting: Family Medicine

## 2020-08-08 DIAGNOSIS — F32A Depression, unspecified: Secondary | ICD-10-CM

## 2020-08-08 DIAGNOSIS — I10 Essential (primary) hypertension: Secondary | ICD-10-CM

## 2020-08-08 MED ORDER — VENLAFAXINE HCL 25 MG PO TABS: 12.5000 mg | ORAL_TABLET | Freq: Two times a day (BID) | ORAL | 0 refills | Status: DC

## 2020-08-08 MED ORDER — AMLODIPINE BESYLATE 10 MG PO TABS: 10.0000 mg | ORAL_TABLET | Freq: Every day | ORAL | 3 refills | Status: DC

## 2020-08-08 MED ORDER — AMLODIPINE BESYLATE 10 MG PO TABS: 10.0000 mg | ORAL_TABLET | Freq: Every day | ORAL | 0 refills | Status: DC

## 2020-08-08 MED ORDER — VENLAFAXINE HCL 100 MG PO TABS: 100.0000 mg | ORAL_TABLET | Freq: Two times a day (BID) | ORAL | 0 refills | Status: DC

## 2020-08-08 NOTE — Telephone Encounter (Signed)
Pt LOV was on 06/25/2020 and last refill was on 07/02/2020 for 60 tablets, Please advise. Refill for Amlodipine sent to pt pharmacy

## 2020-08-08 NOTE — Telephone Encounter (Signed)
Pt call and want a refill on venlafaxine (EFFEXOR) 25 MG tablet don't want a tablet want a cap and refill on amLODipine (NORVASC) 10 MG tablet [facility] to  [id number] - [place], [place] - [place] Phone:  [phone]  Fax:  [phone]     v

## 2020-08-08 NOTE — Telephone Encounter (Signed)
Prescription for Effexor for 100 mg and 25 mg tabs sent to pharmacy is breaking the 25 mg tabs in half for a combined dose of 112.5 mg twice daily.  Refill for Norvasc 10 mg also sent to pharmacy.  Patient needs to schedule an appointment for follow-up as her blood pressure was uncontrolled at her last visit.

## 2020-08-11 ENCOUNTER — Encounter: Payer: Self-pay | Admitting: Physical Therapy

## 2020-08-11 ENCOUNTER — Ambulatory Visit: Payer: BC Managed Care – PPO | Admitting: Physical Therapy

## 2020-08-11 ENCOUNTER — Ambulatory Visit (INDEPENDENT_AMBULATORY_CARE_PROVIDER_SITE_OTHER): Payer: BC Managed Care – PPO | Admitting: Physical Therapy

## 2020-08-11 ENCOUNTER — Other Ambulatory Visit: Payer: Self-pay

## 2020-08-11 DIAGNOSIS — M542 Cervicalgia: Secondary | ICD-10-CM | POA: Diagnosis not present

## 2020-08-11 DIAGNOSIS — M6281 Muscle weakness (generalized): Secondary | ICD-10-CM | POA: Diagnosis not present

## 2020-08-11 DIAGNOSIS — R2689 Other abnormalities of gait and mobility: Secondary | ICD-10-CM | POA: Diagnosis not present

## 2020-08-11 DIAGNOSIS — M545 Low back pain, unspecified: Secondary | ICD-10-CM | POA: Diagnosis not present

## 2020-08-11 DIAGNOSIS — G8929 Other chronic pain: Secondary | ICD-10-CM
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
## 2020-08-11 NOTE — Telephone Encounter (Signed)
To be clear that is not how this works!  Norvasc is a once a day medicine and the highest appropriate dose of it is 10 mg.  Pt was never advised to take 2 tabs and should stop.  All the more reason pt needs an appt.

## 2020-08-11 NOTE — Telephone Encounter (Signed)
  FYI Spoke with pt advised that Dr Volanda Napoleon wants her to schedule a f/u for BP check, pt state that she is currently taking 2 tablets for her amlodipine, state that she is keeping record of her BP at home and that its controlled when she takes 2 tablets, pt state that she will call the office to schedule a f/u visit for BP check / medication review

## 2020-08-11 NOTE — Patient Instructions (Signed)
Access Code: [email or web address] URL: [email or web address] Date: 08/11/2020 Prepared by: Elsie Ra  Exercises Seated Cervical Sidebending Stretch - 2 x daily - 6 x weekly - 1 sets - 2-3 reps - 30 sec hold Seated Scapular Retraction - 2 x daily - 6 x weekly - 2-3 sets - 10 reps - 5 sec hold Standing Lumbar Extension at Ridget[place] - 2 x daily - 6 x weekly - 3 sets - 10 reps Supine Lower Trunk Rotation - 2 x daily - 6 x weekly - 1 sets - 10 reps - 5 sec hold Hooklying Single Knee to Chest Stretch - 2 x daily - 6 x weekly - 1 sets - 2 reps - 20 hold Supine Bridge - 2 x daily - 6 x weekly - 1-2 sets - 10 reps - 5 hold

## 2020-08-12 NOTE — Telephone Encounter (Signed)
Spoke with pt advised per Dr Volanda Napoleon to stop taking 2 Norvasc tablets and to schedule appointment for med review. Pt state that she will be calling the office to schedule appointment

## 2020-08-19 ENCOUNTER — Ambulatory Visit (INDEPENDENT_AMBULATORY_CARE_PROVIDER_SITE_OTHER): Payer: BC Managed Care – PPO | Admitting: Physical Therapy

## 2020-08-19 ENCOUNTER — Encounter: Payer: Self-pay | Admitting: Physical Therapy

## 2020-08-19 ENCOUNTER — Other Ambulatory Visit: Payer: Self-pay

## 2020-08-19 DIAGNOSIS — M542 Cervicalgia: Secondary | ICD-10-CM | POA: Diagnosis not present

## 2020-08-19 DIAGNOSIS — M545 Low back pain, unspecified: Secondary | ICD-10-CM | POA: Diagnosis not present

## 2020-08-19 DIAGNOSIS — R2689 Other abnormalities of gait and mobility: Secondary | ICD-10-CM

## 2020-08-19 DIAGNOSIS — M6281 Muscle weakness (generalized): Secondary | ICD-10-CM

## 2020-08-19 DIAGNOSIS — G8929 Other chronic pain: Secondary | ICD-10-CM

## 2020-08-19 NOTE — Therapy (Signed)
[facility] [place], [place], [address] Phone: [phone]   Fax:  [phone]  Physical Therapy Treatment  Patient Details  Name: Rachel Fields MRN: [id number] Date of Birth: Mar 02, 1956 Referring Provider (PT): Leandrew Koyanagi, MD   Encounter Date: 08/19/2020   PT End of Session - 08/19/20 1423    Visit Number 2    Number of Visits 12    Date for PT Re-Evaluation 10/06/20    PT Start Time 9767    PT Stop Time 1427    PT Time Calculation (min) 42 min    Activity Tolerance Patient tolerated treatment well    Behavior During Therapy [facility] for tasks assessed/performed           Past Medical History:  Diagnosis Date  . Allergy   . Anxiety   . Anxiety and depression   . Arthritis   . Depression   . HTN (hypertension)   . Hx of iron deficiency anemia    reports [place] trait; reports on iron her whole life  . Hypertension   . Low back pain    chronic, reports hx DDD treated by chiropractor  . No blood products    Jehovah's Witness  . OAB (overactive bladder)   . Seasonal allergies   . Sickle cell trait ([place])   . Uterine fibroid     Past Surgical History:  Procedure Laterality Date  . BUNIONECTOMY Bilateral 1987  . CESAREAN SECTION    . Teeth extracted   01/14/2017  . TUBAL LIGATION    . TUMOR REMOVAL     behind left eye    There were no vitals filed for this visit.   Subjective Assessment - 08/19/20 1420    Subjective Pt arrivng to today reporting 7/10 pain in her low back and neck. Pt reporting just waking up.    Pertinent History PMH: DM,HTTN,    Diagnostic tests CT of neck and back on 06/11/20 show" no acute fx, mild degenerative changes in her neck and Prominent lumbar spondylosis and facet hypertrophy in lumbar".    Currently in Pain? Yes    Pain Score 7     Pain Location Back    Pain Orientation Right    Pain Descriptors / Indicators Aching    Pain Type Acute pain    Pain Onset More than a month ago     Multiple Pain Sites Yes    Pain Score 7    Pain Location Neck    Pain Descriptors / Indicators Aching    Pain Type Acute pain    Pain Onset More than a month ago    Pain Frequency Constant    Aggravating Factors  sleeping wrong    Pain Relieving Factors heat, pain meds                             [facility] Adult PT Treatment/Exercise - 08/19/20 0001      Exercises   Exercises Neck;Lumbar      Neck Exercises: Theraband   Scapula Retraction 10 reps    Scapula Retraction Limitations holding 5 seconds each    Rows 15 reps;Red      Lumbar Exercises: Stretches   Standing Extension 10 reps    Standing Extension Limitations elbows against wall     Piriformis Stretch 3 reps;20 seconds      Modalities   Modalities Electrical Stimulation;Moist Heat      Moist  Heat Therapy   Number Minutes Moist Heat 10 Minutes    Moist Heat Location Cervical;Lumbar Spine      Electrical Stimulation   Electrical Stimulation Location neck and back    Electrical Stimulation Action pre-modulated 80-150 Hz x 10 minutes    Electrical Stimulation Goals Pain      Manual Therapy   Manual therapy comments grade 2-3 P-A lumbar mobs      Neck Exercises: Stretches   Upper Trapezius Stretch 3 reps    Levator Stretch 3 reps                  PT Education - 08/19/20 1425    Education Details HEP reviwed    Person(s) Educated Patient    Methods Explanation;Demonstration    Comprehension Verbalized understanding;Returned demonstration            PT Short Term Goals - 08/11/20 1121      PT SHORT TERM GOAL #1   Title Pt will be I with initial HEP    Time 4    Period Weeks    Status New    Target Date 09/08/20      PT SHORT TERM GOAL #2   Title Pt will report 25% reduced pain neck and back    Time 4    Period Weeks    Status New      PT SHORT TERM GOAL #3   Title -      PT SHORT TERM GOAL #4   Title -             PT Long Term Goals - 08/19/20 1427      PT  LONG TERM GOAL #1   Title Pt will be I with advanced HEP    Status On-going      PT LONG TERM GOAL #2   Title will report overall 50% improvment in neck/back pain    Status On-going      PT LONG TERM GOAL #3   Title Will improve neck/back ROM to [facility].    Status On-going      PT LONG TERM GOAL #4   Title Will improve overall UE/LE strength to 4+/5 MMT    Status On-going                 Plan - 08/19/20 1423    Clinical Impression Statement Pt arriving to therapy reporting 7/10 pain in neck and low back. Pt tolerating exercises well reporting some relief following E-stim and moist heat. Cotninue to address pt's functional deficits at next visit with strengthening, ROM, mobs and endurance.    Personal Factors and Comorbidities Comorbidity 2    Comorbidities PMH: DM,HTN,    Examination-Activity Limitations Bend;Caring for Others;Carry;Lift;Stand;Stairs;Squat;Sleep;Locomotion Level;Transfers    Examination-Participation Restrictions Cleaning;Occupation;Driving;Laundry;Shop    Stability/Clinical Decision Making Evolving/Moderate complexity    Rehab Potential Good    PT Frequency 2x / week    PT Duration 8 weeks    PT Treatment/Interventions ADLs/Self Care Home Management;Cryotherapy;Electrical Stimulation;Iontophoresis 4mg /ml Dexamethasone;Moist Heat;Traction;Ultrasound;Therapeutic activities;Therapeutic exercise;Neuromuscular re-education;Balance training;Manual techniques;Passive range of motion;Dry needling;Joint Manipulations;Spinal Manipulations    PT Next Visit Plan review and update HEP PRN, needs neck and back stretching and strength.    PT Home Exercise Plan Access Code: [email or web address]    Consulted and Agree with Plan of Care Patient           Patient will benefit from skilled therapeutic intervention in order to improve the following deficits and impairments:  Decreased activity  tolerance, Decreased mobility, Decreased endurance, Decreased range of motion, Decreased strength,  Difficulty walking, Increased muscle spasms, Impaired flexibility, Postural dysfunction, Improper body mechanics, Pain  Visit Diagnosis: Chronic bilateral low back pain, unspecified whether sciatica present  Cervicalgia  Muscle weakness (generalized)  Other abnormalities of gait and mobility     Problem List Patient Active Problem List   Diagnosis Date Noted  . Unilateral primary osteoarthritis, right hip 09/27/2018  . Diabetes mellitus without complication ([place]) 97/58/8325  . Hypertension associated with diabetes ([place]) 12/08/2017  . BMI 32.0-32.9,adult 12/08/2017  . Seasonal allergies 01/21/2015  . OAB (overactive bladder) 01/21/2015  . Hyperlipidemia associated with type 2 diabetes mellitus ([place]) 02/14/2014  . Obesity 12/19/2009  . LOW BACK PAIN 03/18/2008  . DEPRESSION/ANXIETY 06/15/2007  . HYPERTENSION, BENIGN 06/15/2007    Oretha Caprice  PT, MPT 08/19/2020, 2:30 PM  [facility] [address] [place], [place], [address] Phone: [phone]   Fax:  [phone]  Name: Jordy Hewins MRN: [id number] Date of Birth: 08/20/1956

## 2020-08-21 ENCOUNTER — Ambulatory Visit (INDEPENDENT_AMBULATORY_CARE_PROVIDER_SITE_OTHER): Payer: BC Managed Care – PPO | Admitting: Physical Therapy

## 2020-08-21 ENCOUNTER — Encounter: Payer: Self-pay | Admitting: Physical Therapy

## 2020-08-21 ENCOUNTER — Other Ambulatory Visit: Payer: Self-pay

## 2020-08-21 DIAGNOSIS — G8929 Other chronic pain: Secondary | ICD-10-CM

## 2020-08-21 DIAGNOSIS — M542 Cervicalgia: Secondary | ICD-10-CM | POA: Diagnosis not present

## 2020-08-21 DIAGNOSIS — M545 Low back pain, unspecified: Secondary | ICD-10-CM | POA: Diagnosis not present

## 2020-08-21 DIAGNOSIS — R2689 Other abnormalities of gait and mobility: Secondary | ICD-10-CM | POA: Diagnosis not present

## 2020-08-21 DIAGNOSIS — M6281 Muscle weakness (generalized): Secondary | ICD-10-CM | POA: Diagnosis not present

## 2020-08-21 NOTE — Therapy (Signed)
[facility] Physical Therapy [address] [place], [place], [address] Phone: [phone]   Fax:  [phone]  Physical Therapy Treatment  Patient Details  Name: Rachel Fields MRN: [id number] Date of Birth: [birth year] Referring Provider (PT): Leandrew Koyanagi, MD   Encounter Date: 08/21/2020   PT End of Session - 08/21/20 1108    Visit Number 3    Number of Visits 12    Date for PT Re-Evaluation 10/06/20    PT Start Time 1517    PT Stop Time 1103    PT Time Calculation (min) 40 min    Activity Tolerance Patient tolerated treatment well    Behavior During Therapy [facility] for tasks assessed/performed           Past Medical History:  Diagnosis Date  . Allergy   . Anxiety   . Anxiety and depression   . Arthritis   . Depression   . HTN (hypertension)   . Hx of iron deficiency anemia    reports  trait; reports on iron her whole life  . Hypertension   . Low back pain    chronic, reports hx DDD treated by chiropractor  . No blood products    Jehovah's Witness  . OAB (overactive bladder)   . Seasonal allergies   . Sickle cell trait ([place])   . Uterine fibroid     Past Surgical History:  Procedure Laterality Date  . BUNIONECTOMY Bilateral 1987  . CESAREAN SECTION    . Teeth extracted   01/14/2017  . TUBAL LIGATION    . TUMOR REMOVAL     behind left eye    There were no vitals filed for this visit.   Subjective Assessment - 08/21/20 1043    Subjective Pt arrivng to today reporting 9/10 pain in her low back and neck. Pt reporting she must have slept wrong last night    Pertinent History PMH: DM,HTTN,    Diagnostic tests CT of neck and back on 06/11/20 show" no acute fx, mild degenerative changes in her neck and Prominent lumbar spondylosis and facet hypertrophy in lumbar".    Pain Onset More than a month ago    Pain Onset More than a month ago             [facility] Adult PT Treatment/Exercise - 08/21/20 0001      Exercises   Exercises Neck;Lumbar       Neck Exercises: Theraband   Scapula Retraction --    Scapula Retraction Limitations --    Shoulder Extension 15 reps;Red    Rows 15 reps;Red      Lumbar Exercises: Stretches   Standing Extension 10 reps    Standing Extension Limitations elbows against wall     Piriformis Stretch 2 reps;30 seconds   one rep on Lt   Other Lumbar Stretch Exercise sidelying lumbar-thoracic rotation stretching 10 sec X 5 reps bilat      Lumbar Exercises: Supine   Bridge 15 reps;5 seconds      Modalities   Modalities Electrical Stimulation;Moist Heat      Moist Heat Therapy   Number Minutes Moist Heat 15 Minutes    Moist Heat Location Cervical;Lumbar Spine      Electrical Stimulation   Electrical Stimulation Location low back    Electrical Stimulation Action IFC to low back, 15 min    Electrical Stimulation Parameters tolerance 15 min    Electrical Stimulation Goals Pain      Manual Therapy   Manual therapy  comments --      Neck Exercises: Stretches   Upper Trapezius Stretch 3 reps;10 seconds    Levator Stretch --                    PT Short Term Goals - 08/11/20 1121      PT SHORT TERM GOAL #1   Title Pt will be I with initial HEP    Time 4    Period Weeks    Status New    Target Date 09/08/20      PT SHORT TERM GOAL #2   Title Pt will report 25% reduced pain neck and back    Time 4    Period Weeks    Status New      PT SHORT TERM GOAL #3   Title -      PT SHORT TERM GOAL #4   Title -             PT Long Term Goals - 08/19/20 1427      PT LONG TERM GOAL #1   Title Pt will be I with advanced HEP    Status On-going      PT LONG TERM GOAL #2   Title will report overall 50% improvment in neck/back pain    Status On-going      PT LONG TERM GOAL #3   Title Will improve neck/back ROM to [facility].    Status On-going      PT LONG TERM GOAL #4   Title Will improve overall UE/LE strength to 4+/5 MMT    Status On-going                 Plan - 08/21/20  1109    Clinical Impression Statement Continued to use heat and stim to reduce overall pain after she slept wrong. This was followed by stretching and light strenghtening to her tolerance, she does need cuing to stay in painfree ROM. PT will continue to progress as able to her tolerance.    Personal Factors and Comorbidities Comorbidity 2    Comorbidities PMH: DM,HTN,    Examination-Activity Limitations [place];Caring for Others;Carry;Lift;Stand;Stairs;Squat;Sleep;Locomotion Level;Transfers    Examination-Participation Restrictions Cleaning;Occupation;Driving;Laundry;Shop    Stability/Clinical Decision Making Evolving/Moderate complexity    Rehab Potential Good    PT Frequency 2x / week    PT Duration 8 weeks    PT Treatment/Interventions ADLs/Self Care Home Management;Cryotherapy;Electrical Stimulation;Iontophoresis 4mg /ml Dexamethasone;Moist Heat;Traction;Ultrasound;Therapeutic activities;Therapeutic exercise;Neuromuscular re-education;Balance training;Manual techniques;Passive range of motion;Dry needling;Joint Manipulations;Spinal Manipulations    PT Next Visit Plan needs neck and back stretching and strength. Modalaties and manual PRN    PT Home Exercise Plan Access Code: [email or web address]    Consulted and Agree with Plan of Care Patient           Patient will benefit from skilled therapeutic intervention in order to improve the following deficits and impairments:  Decreased activity tolerance, Decreased mobility, Decreased endurance, Decreased range of motion, Decreased strength, Difficulty walking, Increased muscle spasms, Impaired flexibility, Postural dysfunction, Improper body mechanics, Pain  Visit Diagnosis: Chronic bilateral low back pain, unspecified whether sciatica present  Cervicalgia  Other abnormalities of gait and mobility  Muscle weakness (generalized)     Problem List Patient Active Problem List   Diagnosis Date Noted  . Unilateral primary osteoarthritis, right hip  09/27/2018  . Diabetes mellitus without complication ([place]) 71/69/6789  . Hypertension associated with diabetes ([place]) 12/08/2017  . BMI 32.0-32.9,adult 12/08/2017  . Seasonal allergies 01/21/2015  . OAB (  overactive bladder) 01/21/2015  . Hyperlipidemia associated with type 2 diabetes mellitus ([place]) 02/14/2014  . Obesity 12/19/2009  . LOW BACK PAIN 03/18/2008  . DEPRESSION/ANXIETY 06/15/2007  . HYPERTENSION, BENIGN 06/15/2007    Silvestre Mesi 08/21/2020, 11:10 AM  [facility] Physical Therapy [address] [place], [place], [address] Phone: [phone]   Fax:  [phone]  Name: Rachel Fields MRN: [id number] Date of Birth: 1955-12-30

## 2020-08-22 ENCOUNTER — Other Ambulatory Visit: Payer: Self-pay | Admitting: Orthopaedic Surgery

## 2020-08-26 ENCOUNTER — Ambulatory Visit (INDEPENDENT_AMBULATORY_CARE_PROVIDER_SITE_OTHER): Payer: BC Managed Care – PPO | Admitting: Physical Therapy

## 2020-08-26 ENCOUNTER — Other Ambulatory Visit: Payer: Self-pay

## 2020-08-26 DIAGNOSIS — R2689 Other abnormalities of gait and mobility: Secondary | ICD-10-CM

## 2020-08-26 DIAGNOSIS — M542 Cervicalgia: Secondary | ICD-10-CM

## 2020-08-26 DIAGNOSIS — G8929 Other chronic pain: Secondary | ICD-10-CM

## 2020-08-26 DIAGNOSIS — M545 Low back pain, unspecified: Secondary | ICD-10-CM | POA: Diagnosis not present

## 2020-08-26 DIAGNOSIS — M6281 Muscle weakness (generalized): Secondary | ICD-10-CM | POA: Diagnosis not present

## 2020-08-26 NOTE — Therapy (Signed)
[facility] Physical Therapy [address] [place], [place], [address] Phone: [phone]   Fax:  [phone]  Physical Therapy Treatment  Patient Details  Name: Rachel Fields MRN: [id number] Date of Birth: 09/04/1956 Referring Provider (PT): Leandrew Koyanagi, MD   Encounter Date: 08/26/2020   PT End of Session - 08/26/20 1343    Visit Number 4    Number of Visits 12    Date for PT Re-Evaluation 10/06/20    PT Start Time 0104    PT Stop Time 0135    PT Time Calculation (min) 31 min    Activity Tolerance Patient tolerated treatment well    Behavior During Therapy [facility] for tasks assessed/performed           Past Medical History:  Diagnosis Date  . Allergy   . Anxiety   . Anxiety and depression   . Arthritis   . Depression   . HTN (hypertension)   . Hx of iron deficiency anemia    reports [place] trait; reports on iron her whole life  . Hypertension   . Low back pain    chronic, reports hx DDD treated by chiropractor  . No blood products    Jehovah's Witness  . OAB (overactive bladder)   . Seasonal allergies   . Sickle cell trait ([place])   . Uterine fibroid     Past Surgical History:  Procedure Laterality Date  . BUNIONECTOMY Bilateral 1987  . CESAREAN SECTION    . Teeth extracted   01/14/2017  . TUBAL LIGATION    . TUMOR REMOVAL     behind left eye    There were no vitals filed for this visit.   Subjective Assessment - 08/26/20 1340    Subjective Pt reporting she has already done the exercises today so she only wants to have the heat and TENS and she has to leave for another plans she has.    Pertinent History PMH: DM,HTTN,    Diagnostic tests CT of neck and back on 06/11/20 show" no acute fx, mild degenerative changes in her neck and Prominent lumbar spondylosis and facet hypertrophy in lumbar".    Pain Onset More than a month ago    Pain Onset More than a month ago            [facility] Adult PT Treatment/Exercise - 08/26/20 0001      Modalities    Modalities Electrical Stimulation;Moist Heat      Moist Heat Therapy   Number Minutes Moist Heat 20 Minutes    Moist Heat Location Cervical;Lumbar Spine      Electrical Stimulation   Electrical Stimulation Location low back    Electrical Stimulation Action IFC to low back in prone    Electrical Stimulation Parameters tolerance, 20 min    Electrical Stimulation Goals Pain                  PT Education - 08/26/20 1342    Education Details home TENS unit recommendaiton, instructions and  printout    Person(s) Educated Patient    Methods Explanation;Verbal cues    Comprehension Verbalized understanding            PT Short Term Goals - 08/11/20 1121      PT SHORT TERM GOAL #1   Title Pt will be I with initial HEP    Time 4    Period Weeks    Status New    Target Date 09/08/20  PT SHORT TERM GOAL #2   Title Pt will report 25% reduced pain neck and back    Time 4    Period Weeks    Status New      PT SHORT TERM GOAL #3   Title -      PT SHORT TERM GOAL #4   Title -             PT Long Term Goals - 08/19/20 1427      PT LONG TERM GOAL #1   Title Pt will be I with advanced HEP    Status On-going      PT LONG TERM GOAL #2   Title will report overall 50% improvment in neck/back pain    Status On-going      PT LONG TERM GOAL #3   Title Will improve neck/back ROM to [facility].    Status On-going      PT LONG TERM GOAL #4   Title Will improve overall UE/LE strength to 4+/5 MMT    Status On-going                 Plan - 08/26/20 1343    Clinical Impression Statement She only wants to stay for heat and TENS this sesison. She was provided with self care education for home TENS unit so she can better manage her pain at home.    Personal Factors and Comorbidities Comorbidity 2    Comorbidities PMH: DM,HTN,    Examination-Activity Limitations [place];Caring for Others;Carry;Lift;Stand;Stairs;Squat;Sleep;Locomotion Level;Transfers     Examination-Participation Restrictions Cleaning;Occupation;Driving;Laundry;Shop    Stability/Clinical Decision Making Evolving/Moderate complexity    Rehab Potential Good    PT Frequency 2x / week    PT Duration 8 weeks    PT Treatment/Interventions ADLs/Self Care Home Management;Cryotherapy;Electrical Stimulation;Iontophoresis 4mg /ml Dexamethasone;Moist Heat;Traction;Ultrasound;Therapeutic activities;Therapeutic exercise;Neuromuscular re-education;Balance training;Manual techniques;Passive range of motion;Dry needling;Joint Manipulations;Spinal Manipulations    PT Next Visit Plan needs neck and back stretching and strength. Modalaties and manual PRN    PT Home Exercise Plan Access Code: [email or web address]    Consulted and Agree with Plan of Care Patient           Patient will benefit from skilled therapeutic intervention in order to improve the following deficits and impairments:  Decreased activity tolerance, Decreased mobility, Decreased endurance, Decreased range of motion, Decreased strength, Difficulty walking, Increased muscle spasms, Impaired flexibility, Postural dysfunction, Improper body mechanics, Pain  Visit Diagnosis: Chronic bilateral low back pain, unspecified whether sciatica present  Cervicalgia  Other abnormalities of gait and mobility  Muscle weakness (generalized)     Problem List Patient Active Problem List   Diagnosis Date Noted  . Unilateral primary osteoarthritis, right hip 09/27/2018  . Diabetes mellitus without complication ([place]) 54/56/2563  . Hypertension associated with diabetes ([place]) 12/08/2017  . BMI 32.0-32.9,adult 12/08/2017  . Seasonal allergies 01/21/2015  . OAB (overactive bladder) 01/21/2015  . Hyperlipidemia associated with type 2 diabetes mellitus ([place]) 02/14/2014  . Obesity 12/19/2009  . LOW BACK PAIN 03/18/2008  . DEPRESSION/ANXIETY 06/15/2007  . HYPERTENSION, BENIGN 06/15/2007    Silvestre Mesi 08/26/2020, 1:46 PM  [facility] Physical Therapy [address] [place], [place], [address] Phone: [phone]   Fax:  [phone]  Name: Rachel Fields MRN: [id number] Date of Birth: 08-Feb-1956

## 2020-08-26 NOTE — Patient Instructions (Signed)
TENS UNIT: This is helpful for muscle pain and spasm.   Search and Purchase a TENS 7000 2nd edition at www.tenspros.com. or amazon, or can get a similar one at the pharmacy It should be less than $30.     TENS unit instructions: Do not shower or bathe with the unit on Turn the unit off before removing electrodes or batteries If the electrodes lose stickiness add a drop of water to the electrodes after they are disconnected from the unit and place on plastic sheet. If you continued to have difficulty, call the TENS unit company to purchase more electrodes. Do not apply lotion on the skin area prior to use. Make sure the skin is clean and dry as this will help prolong the life of the electrodes. After use, always check skin for unusual red areas, rash or other skin difficulties. If there are any skin problems, does not apply electrodes to the same area. Never remove the electrodes from the unit by pulling the wires. Do not use the TENS unit or electrodes other than as directed. Do not change electrode placement without consultating your therapist or physician. Keep 2 fingers with between each electrode. Wear time ratio is 2:1, on to off times.    For example on for 30 minutes off for 15 minutes and then on for 30 minutes off for 15 minutes

## 2020-08-28 ENCOUNTER — Ambulatory Visit (INDEPENDENT_AMBULATORY_CARE_PROVIDER_SITE_OTHER): Payer: BC Managed Care – PPO | Admitting: Physical Therapy

## 2020-08-28 ENCOUNTER — Other Ambulatory Visit: Payer: Self-pay

## 2020-08-28 DIAGNOSIS — M6281 Muscle weakness (generalized): Secondary | ICD-10-CM

## 2020-08-28 DIAGNOSIS — M542 Cervicalgia: Secondary | ICD-10-CM

## 2020-08-28 DIAGNOSIS — R2689 Other abnormalities of gait and mobility: Secondary | ICD-10-CM | POA: Diagnosis not present

## 2020-08-28 DIAGNOSIS — G8929 Other chronic pain: Secondary | ICD-10-CM

## 2020-08-28 DIAGNOSIS — M545 Low back pain, unspecified: Secondary | ICD-10-CM | POA: Diagnosis not present

## 2020-08-28 NOTE — Therapy (Signed)
[facility] Physical Therapy [address] [place], [place], [address] Phone: [phone]   Fax:  [phone]  Physical Therapy Treatment  Patient Details  Name: Rachel Fields MRN: [id number] Date of Birth: 23-Jan-1956 Referring Provider (PT): Leandrew Koyanagi, MD   Encounter Date: 08/28/2020   PT End of Session - 08/28/20 1117    Visit Number 5    Number of Visits 12    Date for PT Re-Evaluation 10/06/20    PT Start Time 7654    PT Stop Time 1100    PT Time Calculation (min) 45 min    Activity Tolerance Patient tolerated treatment well    Behavior During Therapy [facility] for tasks assessed/performed           Past Medical History:  Diagnosis Date  . Allergy   . Anxiety   . Anxiety and depression   . Arthritis   . Depression   . HTN (hypertension)   . Hx of iron deficiency anemia    reports [place] trait; reports on iron her whole life  . Hypertension   . Low back pain    chronic, reports hx DDD treated by chiropractor  . No blood products    Jehovah's Witness  . OAB (overactive bladder)   . Seasonal allergies   . Sickle cell trait ([place])   . Uterine fibroid     Past Surgical History:  Procedure Laterality Date  . BUNIONECTOMY Bilateral 1987  . CESAREAN SECTION    . Teeth extracted   01/14/2017  . TUBAL LIGATION    . TUMOR REMOVAL     behind left eye    There were no vitals filed for this visit.   Subjective Assessment - 08/28/20 1041    Subjective relays 8/10 overall back pain with throbbing going down her Rt leg. She wants to get a back brace for work.    Pertinent History PMH: DM,HTTN,    Diagnostic tests CT of neck and back on 06/11/20 show" no acute fx, mild degenerative changes in her neck and Prominent lumbar spondylosis and facet hypertrophy in lumbar".    Pain Onset More than a month ago    Pain Onset More than a month ago                             [facility] Adult PT Treatment/Exercise - 08/28/20 0001      Exercises    Exercises Neck;Lumbar      Neck Exercises: Theraband   Shoulder Extension Red;20 reps    Rows Red;20 reps      Lumbar Exercises: Stretches   Standing Extension 10 reps    Standing Extension Limitations elbows against wall     Piriformis Stretch 2 reps;30 seconds   one rep on Lt   Other Lumbar Stretch Exercise sidelying lumbar-thoracic rotation stretching 10 sec X 5 reps bilat      Lumbar Exercises: Supine   Bridge 15 reps;5 seconds      Modalities   Modalities Electrical Stimulation;Moist Heat      Moist Heat Therapy   Moist Heat Location Cervical;Lumbar Spine      Electrical Stimulation   Electrical Stimulation Location low back and neck    Electrical Stimulation Action pre mod to neck and back    Electrical Stimulation Parameters tolerance 20 min    Electrical Stimulation Goals Pain      Manual Therapy   Manual therapy comments Rt leg LAD in  ER and IR      Neck Exercises: Stretches   Upper Trapezius Stretch --                    PT Short Term Goals - 08/11/20 1121      PT SHORT TERM GOAL #1   Title Pt will be I with initial HEP    Time 4    Period Weeks    Status New    Target Date 09/08/20      PT SHORT TERM GOAL #2   Title Pt will report 25% reduced pain neck and back    Time 4    Period Weeks    Status New      PT SHORT TERM GOAL #3   Title -      PT SHORT TERM GOAL #4   Title -             PT Long Term Goals - 08/19/20 1427      PT LONG TERM GOAL #1   Title Pt will be I with advanced HEP    Status On-going      PT LONG TERM GOAL #2   Title will report overall 50% improvment in neck/back pain    Status On-going      PT LONG TERM GOAL #3   Title Will improve neck/back ROM to [facility].    Status On-going      PT LONG TERM GOAL #4   Title Will improve overall UE/LE strength to 4+/5 MMT    Status On-going                 Plan - 08/28/20 1118    Clinical Impression Statement She is still having high pain levels that are  excacerated at work. She is wondering if a brace would help so we was encouraged to reach out to MD about this but would recommend only wearing it for work only and not when she isnt active.    Personal Factors and Comorbidities Comorbidity 2    Comorbidities PMH: DM,HTN,    Examination-Activity Limitations [place];Caring for Others;Carry;Lift;Stand;Stairs;Squat;Sleep;Locomotion Level;Transfers    Examination-Participation Restrictions Cleaning;Occupation;Driving;Laundry;Shop    Stability/Clinical Decision Making Evolving/Moderate complexity    Rehab Potential Good    PT Frequency 2x / week    PT Duration 8 weeks    PT Treatment/Interventions ADLs/Self Care Home Management;Cryotherapy;Electrical Stimulation;Iontophoresis 4mg /ml Dexamethasone;Moist Heat;Traction;Ultrasound;Therapeutic activities;Therapeutic exercise;Neuromuscular re-education;Balance training;Manual techniques;Passive range of motion;Dry needling;Joint Manipulations;Spinal Manipulations    PT Next Visit Plan needs neck and back stretching and strength. Modalaties and manual PRN    PT Home Exercise Plan Access Code: [email or web address]    Consulted and Agree with Plan of Care Patient           Patient will benefit from skilled therapeutic intervention in order to improve the following deficits and impairments:  Decreased activity tolerance, Decreased mobility, Decreased endurance, Decreased range of motion, Decreased strength, Difficulty walking, Increased muscle spasms, Impaired flexibility, Postural dysfunction, Improper body mechanics, Pain  Visit Diagnosis: Chronic bilateral low back pain, unspecified whether sciatica present  Cervicalgia  Other abnormalities of gait and mobility  Muscle weakness (generalized)     Problem List Patient Active Problem List   Diagnosis Date Noted  . Unilateral primary osteoarthritis, right hip 09/27/2018  . Diabetes mellitus without complication ([place]) 76/19/5093  . Hypertension associated with  diabetes ([place]) 12/08/2017  . BMI 32.0-32.9,adult 12/08/2017  . Seasonal allergies 01/21/2015  . OAB (overactive bladder) 01/21/2015  .  Hyperlipidemia associated with type 2 diabetes mellitus ([place]) 02/14/2014  . Obesity 12/19/2009  . LOW BACK PAIN 03/18/2008  . DEPRESSION/ANXIETY 06/15/2007  . HYPERTENSION, BENIGN 06/15/2007    Silvestre Mesi 08/28/2020, 11:20 AM  [facility] Physical Therapy [address] [place], [place], [address] Phone: [phone]   Fax:  [phone]  Name: Rachel Fields MRN: [id number] Date of Birth: May 06, 1956

## 2020-08-29 ENCOUNTER — Other Ambulatory Visit: Payer: Self-pay

## 2020-08-29 DIAGNOSIS — E782 Mixed hyperlipidemia: Secondary | ICD-10-CM

## 2020-08-29 DIAGNOSIS — I10 Essential (primary) hypertension: Secondary | ICD-10-CM

## 2020-08-29 MED ORDER — AMLODIPINE BESYLATE 10 MG PO TABS: 10.0000 mg | ORAL_TABLET | Freq: Every day | ORAL | 1 refills | Status: DC

## 2020-08-29 MED ORDER — ROSUVASTATIN CALCIUM 10 MG PO TABS: 10.0000 mg | ORAL_TABLET | Freq: Every day | ORAL | 2 refills | Status: DC

## 2020-08-29 MED ORDER — IRBESARTAN 150 MG PO TABS: 150.0000 mg | ORAL_TABLET | Freq: Every day | ORAL | 3 refills | Status: DC

## 2020-09-01 ENCOUNTER — Encounter: Payer: BC Managed Care – PPO | Admitting: Physical Therapy

## 2020-09-03 ENCOUNTER — Other Ambulatory Visit: Payer: Self-pay

## 2020-09-03 ENCOUNTER — Ambulatory Visit (INDEPENDENT_AMBULATORY_CARE_PROVIDER_SITE_OTHER): Payer: BC Managed Care – PPO | Admitting: Physical Therapy

## 2020-09-03 DIAGNOSIS — G8929 Other chronic pain: Secondary | ICD-10-CM

## 2020-09-03 DIAGNOSIS — M6281 Muscle weakness (generalized): Secondary | ICD-10-CM

## 2020-09-03 DIAGNOSIS — R2689 Other abnormalities of gait and mobility: Secondary | ICD-10-CM | POA: Diagnosis not present

## 2020-09-03 DIAGNOSIS — M542 Cervicalgia: Secondary | ICD-10-CM | POA: Diagnosis not present

## 2020-09-03 DIAGNOSIS — M545 Low back pain, unspecified: Secondary | ICD-10-CM | POA: Diagnosis not present

## 2020-09-03 NOTE — Therapy (Signed)
[facility] [address] [place], [place], [address] Phone: [phone]   Fax:  [phone]  Physical Therapy Treatment  Patient Details  Name: Rachel Fields MRN: [id number] Date of Birth: [birth year] Referring Provider (PT): Leandrew Koyanagi, MD   Encounter Date: 09/03/2020   PT End of Session - 09/03/20 1036    Visit Number 6    Number of Visits 12    Date for PT Re-Evaluation 10/06/20    PT Start Time 1027    PT Stop Time 1100    PT Time Calculation (min) 33 min    Activity Tolerance Patient tolerated treatment well    Behavior During Therapy [facility] for tasks assessed/performed           Past Medical History:  Diagnosis Date  . Allergy   . Anxiety   . Anxiety and depression   . Arthritis   . Depression   . HTN (hypertension)   . Hx of iron deficiency anemia    reports  trait; reports on iron her whole life  . Hypertension   . Low back pain    chronic, reports hx DDD treated by chiropractor  . No blood products    Jehovah's Witness  . OAB (overactive bladder)   . Seasonal allergies   . Sickle cell trait ([place])   . Uterine fibroid     Past Surgical History:  Procedure Laterality Date  . BUNIONECTOMY Bilateral 1987  . CESAREAN SECTION    . Teeth extracted   01/14/2017  . TUBAL LIGATION    . TUMOR REMOVAL     behind left eye    There were no vitals filed for this visit.   Subjective Assessment - 09/03/20 1033    Subjective Von arriving today 12 minutes late. Pt reporting 8/10 pain in her low back on right side. Pt amb with antalgic gait. Pt just wants "leg pulls" and E-stim/heat today. She stated, "they seem to help the most".    Pertinent History PMH: DM,HTTN,    Diagnostic tests CT of neck and back on 06/11/20 show" no acute fx, mild degenerative changes in her neck and Prominent lumbar spondylosis and facet hypertrophy in lumbar".    Currently in Pain? Yes    Pain Score 8     Pain Location Back    Pain Orientation  Right;Lower    Pain Descriptors / Indicators Aching    Pain Type Acute pain    Pain Onset More than a month ago                             [facility] Adult PT Treatment/Exercise - 09/03/20 0001      Modalities   Modalities Electrical Stimulation;Moist Heat      Moist Heat Therapy   Number Minutes Moist Heat 15 Minutes    Moist Heat Location Lumbar Spine      Electrical Stimulation   Electrical Stimulation Location right side low back and R gluteal    Electrical Stimulation Action Pre-moduated    Electrical Stimulation Parameters 15 minutes    Electrical Stimulation Goals Pain      Manual Therapy   Manual therapy comments R LE LAD x 5 holding 30 seconds each                  PT Education - 09/03/20 1050    Education Details Discussed home TENS unit, another printout provided.    Person(s) Educated  Patient    Methods Explanation;Handout    Comprehension Verbalized understanding            PT Short Term Goals - 08/11/20 1121      PT SHORT TERM GOAL #1   Title Pt will be I with initial HEP    Time 4    Period Weeks    Status New    Target Date 09/08/20      PT SHORT TERM GOAL #2   Title Pt will report 25% reduced pain neck and back    Time 4    Period Weeks    Status New      PT SHORT TERM GOAL #3   Title -      PT SHORT TERM GOAL #4   Title -             PT Long Term Goals - 08/19/20 1427      PT LONG TERM GOAL #1   Title Pt will be I with advanced HEP    Status On-going      PT LONG TERM GOAL #2   Title will report overall 50% improvment in neck/back pain    Status On-going      PT LONG TERM GOAL #3   Title Will improve neck/back ROM to [facility].    Status On-going      PT LONG TERM GOAL #4   Title Will improve overall UE/LE strength to 4+/5 MMT    Status On-going                 Plan - 09/03/20 1048    Clinical Impression Statement Von arrived late to session today requesting only LAD, E-stim / heat. Pt  tolerating LAD and reporting relief following. Continue to progress pt's more functional mobility at next session towrard LTG"s set.    Personal Factors and Comorbidities Comorbidity 2    Comorbidities PMH: DM,HTN,    Examination-Activity Limitations Bend;Caring for Others;Carry;Lift;Stand;Stairs;Squat;Sleep;Locomotion Level;Transfers    Examination-Participation Restrictions Cleaning;Occupation;Driving;Laundry;Shop    Stability/Clinical Decision Making Evolving/Moderate complexity    Rehab Potential Good    PT Frequency 2x / week    PT Duration 8 weeks    PT Treatment/Interventions ADLs/Self Care Home Management;Cryotherapy;Electrical Stimulation;Iontophoresis 4mg /ml Dexamethasone;Moist Heat;Traction;Ultrasound;Therapeutic activities;Therapeutic exercise;Neuromuscular re-education;Balance training;Manual techniques;Passive range of motion;Dry needling;Joint Manipulations;Spinal Manipulations    PT Next Visit Plan needs neck and back stretching and strength. Modalaties and manual PRN    PT Home Exercise Plan Access Code: [email or web address]    Consulted and Agree with Plan of Care Patient           Patient will benefit from skilled therapeutic intervention in order to improve the following deficits and impairments:  Decreased activity tolerance, Decreased mobility, Decreased endurance, Decreased range of motion, Decreased strength, Difficulty walking, Increased muscle spasms, Impaired flexibility, Postural dysfunction, Improper body mechanics, Pain  Visit Diagnosis: Chronic bilateral low back pain, unspecified whether sciatica present  Cervicalgia  Other abnormalities of gait and mobility  Muscle weakness (generalized)     Problem List Patient Active Problem List   Diagnosis Date Noted  . Unilateral primary osteoarthritis, right hip 09/27/2018  . Diabetes mellitus without complication ([place]) 29/56/2130  . Hypertension associated with diabetes ([place]) 12/08/2017  . BMI 32.0-32.9,adult  12/08/2017  . Seasonal allergies 01/21/2015  . OAB (overactive bladder) 01/21/2015  . Hyperlipidemia associated with type 2 diabetes mellitus ([place]) 02/14/2014  . Obesity 12/19/2009  . LOW BACK PAIN 03/18/2008  . DEPRESSION/ANXIETY 06/15/2007  . HYPERTENSION, BENIGN  06/15/2007    Oretha Caprice PT, MPT 09/03/2020, 10:52 AM  [facility] [address] [place], [place], [address] Phone: [phone]   Fax:  [phone]  Name: Rachel Fields MRN: [id number] Date of Birth: 11/19/1955

## 2020-09-03 NOTE — Patient Instructions (Signed)

## 2020-09-08 ENCOUNTER — Encounter: Payer: BC Managed Care – PPO | Admitting: Physical Therapy

## 2020-09-09 ENCOUNTER — Telehealth: Payer: Self-pay | Admitting: Family Medicine

## 2020-09-09 NOTE — Telephone Encounter (Signed)
Pt call and made a appt and want her refill on tolterodine (DETROL LA) 4 MG 24 hr capsule sent to  [id number] - [place], [place] Phone:  [phone]  Fax:  [phone]    Pt want a call back when sent,

## 2020-09-10 ENCOUNTER — Ambulatory Visit (INDEPENDENT_AMBULATORY_CARE_PROVIDER_SITE_OTHER): Payer: BC Managed Care – PPO | Admitting: Rehabilitative and Restorative Service Providers"

## 2020-09-10 ENCOUNTER — Other Ambulatory Visit: Payer: Self-pay

## 2020-09-10 ENCOUNTER — Encounter: Payer: BC Managed Care – PPO | Admitting: Physical Therapy

## 2020-09-10 ENCOUNTER — Encounter: Payer: Self-pay | Admitting: Rehabilitative and Restorative Service Providers"

## 2020-09-10 DIAGNOSIS — M545 Low back pain, unspecified: Secondary | ICD-10-CM

## 2020-09-10 DIAGNOSIS — R2689 Other abnormalities of gait and mobility: Secondary | ICD-10-CM

## 2020-09-10 DIAGNOSIS — M6281 Muscle weakness (generalized): Secondary | ICD-10-CM | POA: Diagnosis not present

## 2020-09-10 DIAGNOSIS — M542 Cervicalgia: Secondary | ICD-10-CM

## 2020-09-10 DIAGNOSIS — G8929 Other chronic pain: Secondary | ICD-10-CM

## 2020-09-10 NOTE — Therapy (Signed)
[facility] [address] [place], [place], [address] Phone: [phone]   Fax:  [phone]  Physical Therapy Treatment  Patient Details  Name: Rachel Fields MRN: [id number] Date of Birth: 07-05-1956 Referring Provider (PT): Leandrew Koyanagi, MD   Encounter Date: 09/10/2020   PT End of Session - 09/10/20 1352    Visit Number 7    Number of Visits 12    Date for PT Re-Evaluation 10/06/20    PT Start Time 4765    PT Stop Time 1427    PT Time Calculation (min) 39 min    Activity Tolerance Patient tolerated treatment well    Behavior During Therapy [facility] for tasks assessed/performed           Past Medical History:  Diagnosis Date  . Allergy   . Anxiety   . Anxiety and depression   . Arthritis   . Depression   . HTN (hypertension)   . Hx of iron deficiency anemia    reports [place] trait; reports on iron her whole life  . Hypertension   . Low back pain    chronic, reports hx DDD treated by chiropractor  . No blood products    Jehovah's Witness  . OAB (overactive bladder)   . Seasonal allergies   . Sickle cell trait ([place])   . Uterine fibroid     Past Surgical History:  Procedure Laterality Date  . BUNIONECTOMY Bilateral 1987  . CESAREAN SECTION    . Teeth extracted   01/14/2017  . TUBAL LIGATION    . TUMOR REMOVAL     behind left eye    There were no vitals filed for this visit.   Subjective Assessment - 09/10/20 1351    Subjective Pt. stated 9/10 pain upon arrival today, primarily noted on Rt lower back.  Pt. stated standing prolonged on feet seemed to hurt more today.    Pertinent History PMH: DM,HTTN,    Diagnostic tests CT of neck and back on 06/11/20 show" no acute fx, mild degenerative changes in her neck and Prominent lumbar spondylosis and facet hypertrophy in lumbar".    Currently in Pain? Yes    Pain Score 9     Pain Location Back    Pain Orientation Right;Lower    Pain Descriptors / Indicators Aching    Pain Type Acute  pain    Pain Onset More than a month ago    Pain Frequency Constant    Aggravating Factors  prolonged standing              [facility] PT Assessment - 09/10/20 0001      AROM   Lumbar Flexion movement to patella, ERP    Lumbar Extension 75% c relief of symptoms                         [facility] Adult PT Treatment/Exercise - 09/10/20 0001      Lumbar Exercises: Stretches   Other Lumbar Stretch Exercise SKC 30 sec x 3 bilateral    Other Lumbar Stretch Exercise sidelying rotational stretch Lt sidelying 15 sec x 3      Lumbar Exercises: Aerobic   Nustep Lvl 5 10 mins LE/UE      Lumbar Exercises: Standing   Other Standing Lumbar Exercises repeated extension x 5      Lumbar Exercises: Supine   Bridge 20 reps   no UE support     Manual Therapy   Manual  therapy comments Lt sidelying regional rotation mobs g3, Rt LE intermittent distraction                    PT Short Term Goals - 08/11/20 1121      PT SHORT TERM GOAL #1   Title Pt will be I with initial HEP    Time 4    Period Weeks    Status New    Target Date 09/08/20      PT SHORT TERM GOAL #2   Title Pt will report 25% reduced pain neck and back    Time 4    Period Weeks    Status New      PT SHORT TERM GOAL #3   Title -      PT SHORT TERM GOAL #4   Title -             PT Long Term Goals - 08/19/20 1427      PT LONG TERM GOAL #1   Title Pt will be I with advanced HEP    Status On-going      PT LONG TERM GOAL #2   Title will report overall 50% improvment in neck/back pain    Status On-going      PT LONG TERM GOAL #3   Title Will improve neck/back ROM to [facility].    Status On-going      PT LONG TERM GOAL #4   Title Will improve overall UE/LE strength to 4+/5 MMT    Status On-going                 Plan - 09/10/20 1407    Clinical Impression Statement Pt. demonstrated mobility restriction based on Rt lumbar region c indications of QL involvement.  Successful improved  symptoms indicated post manual and c mobility interventtion to area.    Personal Factors and Comorbidities Comorbidity 2    Comorbidities PMH: DM,HTN,    Examination-Activity Limitations Bend;Caring for Others;Carry;Lift;Stand;Stairs;Squat;Sleep;Locomotion Level;Transfers    Examination-Participation Restrictions Cleaning;Occupation;Driving;Laundry;Shop    Stability/Clinical Decision Making Evolving/Moderate complexity    Rehab Potential Good    PT Frequency 2x / week    PT Duration 8 weeks    PT Treatment/Interventions ADLs/Self Care Home Management;Cryotherapy;Electrical Stimulation;Iontophoresis 4mg /ml Dexamethasone;Moist Heat;Traction;Ultrasound;Therapeutic activities;Therapeutic exercise;Neuromuscular re-education;Balance training;Manual techniques;Passive range of motion;Dry needling;Joint Manipulations;Spinal Manipulations    PT Next Visit Plan Continue to promote improved activity tolerance c focus on lumbar mobility gains.    PT Home Exercise Plan Access Code: [email or web address]    Consulted and Agree with Plan of Care Patient           Patient will benefit from skilled therapeutic intervention in order to improve the following deficits and impairments:  Decreased activity tolerance, Decreased mobility, Decreased endurance, Decreased range of motion, Decreased strength, Difficulty walking, Increased muscle spasms, Impaired flexibility, Postural dysfunction, Improper body mechanics, Pain  Visit Diagnosis: Chronic bilateral low back pain, unspecified whether sciatica present  Cervicalgia  Other abnormalities of gait and mobility  Muscle weakness (generalized)     Problem List Patient Active Problem List   Diagnosis Date Noted  . Unilateral primary osteoarthritis, right hip 09/27/2018  . Diabetes mellitus without complication ([place]) 57/26/2035  . Hypertension associated with diabetes ([place]) 12/08/2017  . BMI 32.0-32.9,adult 12/08/2017  . Seasonal allergies 01/21/2015  . OAB  (overactive bladder) 01/21/2015  . Hyperlipidemia associated with type 2 diabetes mellitus ([place]) 02/14/2014  . Obesity 12/19/2009  . LOW BACK PAIN 03/18/2008  . DEPRESSION/ANXIETY 06/15/2007  .  HYPERTENSION, BENIGN 06/15/2007    Scot Jun, PT, DPT, OCS, ATC 09/10/20  2:19 PM    [facility] [address] [place], [place], [address] Phone: [phone]   Fax:  [phone]  Name: Glendine Swetz MRN: [id number] Date of Birth: [birth year]

## 2020-09-11 ENCOUNTER — Other Ambulatory Visit: Payer: Self-pay

## 2020-09-11 ENCOUNTER — Ambulatory Visit: Payer: BC Managed Care – PPO | Admitting: Family Medicine

## 2020-09-11 DIAGNOSIS — N3281 Overactive bladder: Secondary | ICD-10-CM

## 2020-09-11 MED ORDER — TOLTERODINE TARTRATE ER 4 MG PO CP24: ORAL_CAPSULE | ORAL | 1 refills | Status: DC

## 2020-09-11 NOTE — Telephone Encounter (Signed)
Pt Rx sent to her pharmacy, pt notified

## 2020-09-11 NOTE — Telephone Encounter (Signed)
Pt Rx sent to pt pharmacy as requested

## 2020-09-12 ENCOUNTER — Encounter: Payer: BC Managed Care – PPO | Admitting: Physical Therapy

## 2020-09-18 ENCOUNTER — Other Ambulatory Visit: Payer: Self-pay

## 2020-09-18 ENCOUNTER — Ambulatory Visit: Payer: BC Managed Care – PPO | Admitting: Family Medicine

## 2020-09-18 ENCOUNTER — Encounter: Payer: Self-pay | Admitting: Family Medicine

## 2020-09-18 VITALS — BP 130/62 | HR 93 | Temp 98.1°F | Wt 198.6 lb

## 2020-09-18 DIAGNOSIS — E1169 Type 2 diabetes mellitus with other specified complication: Secondary | ICD-10-CM

## 2020-09-18 DIAGNOSIS — N3281 Overactive bladder: Secondary | ICD-10-CM

## 2020-09-18 DIAGNOSIS — R0683 Snoring: Secondary | ICD-10-CM

## 2020-09-18 DIAGNOSIS — G8929 Other chronic pain: Secondary | ICD-10-CM

## 2020-09-18 DIAGNOSIS — L853 Xerosis cutis: Secondary | ICD-10-CM

## 2020-09-18 DIAGNOSIS — R400 Somnolence: Secondary | ICD-10-CM

## 2020-09-18 DIAGNOSIS — L309 Dermatitis, unspecified: Secondary | ICD-10-CM

## 2020-09-18 DIAGNOSIS — I10 Essential (primary) hypertension: Secondary | ICD-10-CM | POA: Diagnosis not present

## 2020-09-18 DIAGNOSIS — M545 Low back pain, unspecified: Secondary | ICD-10-CM

## 2020-09-18 MED ORDER — HYDROCORTISONE 1 % EX LOTN: 1.0000 "application " | TOPICAL_LOTION | Freq: Two times a day (BID) | CUTANEOUS | 0 refills | Status: AC

## 2020-09-18 MED ORDER — NAPROXEN 500 MG PO TABS: 500.0000 mg | ORAL_TABLET | Freq: Two times a day (BID) | ORAL | 0 refills | Status: AC | PRN

## 2020-09-18 MED ORDER — TOLTERODINE TARTRATE ER 4 MG PO CP24: ORAL_CAPSULE | ORAL | 1 refills | Status: DC

## 2020-09-18 NOTE — Patient Instructions (Signed)
Overactive Bladder, Adult  Overactive bladder refers to a condition in which a person has a sudden need to pass urine. The person may leak urine if he or she cannot get to the bathroom fast enough (urinary incontinence). A person with this condition may also wake up several times in the night to go to the bathroom. Overactive bladder is associated with poor nerve signals between your bladder and your brain. Your bladder may get the signal to empty before it is full. You may also have very sensitive muscles that make your bladder squeeze too soon. These symptoms might interfere with daily work or social activities. What are the causes? This condition may be associated with or caused by:  Urinary tract infection.  Infection of nearby tissues, such as the prostate.  Prostate enlargement.  Surgery on the uterus or urethra.  Bladder stones, inflammation, or tumors.  Drinking too much caffeine or alcohol.  Certain medicines, especially medicines that get rid of extra fluid in the body (diuretics).  Muscle or nerve weakness, especially from: ? A spinal cord injury. ? Stroke. ? Multiple sclerosis. ? Parkinson's disease.  Diabetes.  Constipation. What increases the risk? You may be at greater risk for overactive bladder if you:  Are an older adult.  Smoke.  Are going through menopause.  Have prostate problems.  Have a neurological disease, such as stroke, dementia, Parkinson's disease, or multiple sclerosis (MS).  Eat or drink things that irritate the bladder. These include alcohol, spicy food, and caffeine.  Are overweight or obese. What are the signs or symptoms? Symptoms of this condition include:  Sudden, strong urge to urinate.  Leaking urine.  Urinating 8 or more times a day.  Waking up to urinate 2 or more times a night. How is this diagnosed? Your health care provider may suspect overactive bladder based on your symptoms. He or she will diagnose this condition  by:  A physical exam and medical history.  Blood or urine tests. You might need bladder or urine tests to help determine what is causing your overactive bladder. You might also need to see a health care provider who specializes in urinary tract problems (urologist). How is this treated? Treatment for overactive bladder depends on the cause of your condition and whether it is mild or severe. You can also make lifestyle changes at home. Options include:  Bladder training. This may include: ? Learning to control the urge to urinate by following a schedule that directs you to urinate at regular intervals (timed voiding). ? Doing Kegel exercises to strengthen your pelvic floor muscles, which support your bladder. Toning these muscles can help you control urination, even if your bladder muscles are overactive.  Special devices. This may include: ? Biofeedback, which uses sensors to help you become aware of your body's signals. ? Electrical stimulation, which uses electrodes placed inside the body (implanted) or outside the body. These electrodes send gentle pulses of electricity to strengthen the nerves or muscles that control the bladder. ? Women may use a plastic device that fits into the vagina and supports the bladder (pessary).  Medicines. ? Antibiotics to treat bladder infection. ? Antispasmodics to stop the bladder from releasing urine at the wrong time. ? Tricyclic antidepressants to relax bladder muscles. ? Injections of botulinum toxin type A directly into the bladder tissue to relax bladder muscles.  Lifestyle changes. This may include: ? Weight loss. Talk to your health care provider about weight loss methods that would work best for you. ?   Diet changes. This may include reducing how much alcohol and caffeine you consume, or drinking fluids at different times of the day. ? Not smoking. Do not use any products that contain nicotine or tobacco, such as cigarettes and e-cigarettes. If  you need help quitting, ask your health care provider.  Surgery. ? A device may be implanted to help manage the nerve signals that control urination. ? An electrode may be implanted to stimulate electrical signals in the bladder. ? A procedure may be done to change the shape of the bladder. This is done only in very severe cases. Follow these instructions at home: Lifestyle  Make any diet or lifestyle changes that are recommended by your health care provider. These may include: ? Drinking less fluid or drinking fluids at different times of the day. ? Cutting down on caffeine or alcohol. ? Doing Kegel exercises. ? Losing weight if needed. ? Eating a healthy and balanced diet to prevent constipation. This may include:  Eating foods that are high in fiber, such as fresh fruits and vegetables, whole grains, and beans.  Limiting foods that are high in fat and processed sugars, such as fried and sweet foods. General instructions  Take over-the-counter and prescription medicines only as told by your health care provider.  If you were prescribed an antibiotic medicine, take it as told by your health care provider. Do not stop taking the antibiotic even if you start to feel better.  Use any implants or pessary as told by your health care provider.  If needed, wear pads to absorb urine leakage.  Keep a journal or log to track how much and when you drink and when you feel the need to urinate. This will help your health care provider monitor your condition.  Keep all follow-up visits as told by your health care provider. This is important. Contact a health care provider if:  You have a fever.  Your symptoms do not get better with treatment.  Your pain and discomfort get worse.  You have more frequent urges to urinate. Get help right away if:  You are not able to control your bladder. Summary  Overactive bladder refers to a condition in which a person has a sudden need to pass  urine.  Several conditions may lead to an overactive bladder.  Treatment for overactive bladder depends on the cause and severity of your condition.  Follow your health care provider's instructions about lifestyle changes, doing Kegel exercises, keeping a journal, and taking medicines. This information is not intended to replace advice given to you by your health care provider. Make sure you discuss any questions you have with your health care provider. Document Revised: 01/18/2019 Document Reviewed: 10/13/2017 Elsevier Patient Education  2020 [employer] for Sleep Apnea  Sleep apnea is a condition in which breathing pauses or becomes shallow during sleep. Sleep apnea screening is a test to determine if you are at risk for sleep apnea. The test is easy and only takes a few minutes. Your health care provider may ask you to have this test in preparation for surgery or as part of a physical exam. What are the symptoms of sleep apnea? Common symptoms of sleep apnea include:  Snoring.  Restless sleep.  Daytime sleepiness.  Pauses in breathing.  Choking during sleep.  Irritability.  Forgetfulness.  Trouble thinking clearly.  Depression.  Personality changes. Most people with sleep apnea are not aware that they have it. Why should I get screened? Getting screened  for sleep apnea can help:  Ensure your safety. It is important for your health care providers to know whether or not you have sleep apnea, especially if you are having surgery or have other long-term (chronic) health conditions.  Improve your health and allow you to get a better night's rest. Restful sleep can help you: ? Have more energy. ? Lose weight. ? Improve high blood pressure. ? Improve diabetes management. ? Prevent stroke. ? Prevent car accidents. How is screening done? Screening usually includes being asked a list of questions about your sleep quality. Some questions you may be asked  include:  Do you snore?  Is your sleep restless?  Do you have daytime sleepiness?  Has a partner or spouse told you that you stop breathing during sleep?  Have you had trouble concentrating or memory loss? If your screening test is positive, you are at risk for the condition. Further testing may be needed to confirm a diagnosis of sleep apnea. Where to find more information You can find screening tools online or at your health care clinic. For more information about sleep apnea screening and healthy sleep, visit these websites:  Centers for Disease Control and Prevention: LearningDermat[employer].pl  American Sleep Apnea Association: www.sleepapnea.org Contact a health care provider if:  You think that you may have sleep apnea. Summary  Sleep apnea screening can help determine if you are at risk for sleep apnea.  It is important for your health care providers to know whether or not you have sleep apnea, especially if you are having surgery or have other chronic health conditions.  You may be asked to take a screening test for sleep apnea in preparation for surgery or as part of a physical exam. This information is not intended to replace advice given to you by your health care provider. Make sure you discuss any questions you have with your health care provider. Document Revised: 07/14/2018 Document Reviewed: 01/07/2017 Elsevier Patient Education  [place].

## 2020-09-19 LAB — HEMOGLOBIN A1C
Hgb A1c MFr Bld: 13.2 % of total Hgb — ABNORMAL HIGH (ref <5.7)
Mean Plasma Glucose: 332 mg/dL
eAG (mmol/L): 18.4 mmol/L

## 2020-09-19 LAB — CBC WITH DIFFERENTIAL/PLATELET
Absolute Monocytes: 593 cells/uL (ref 200–950)
Basophils Absolute: 39 cells/uL (ref 0–200)
Basophils Relative: 0.5 %
Eosinophils Absolute: 162 cells/uL (ref 15–500)
Eosinophils Relative: 2.1 %
HCT: 39.8 % (ref 35.0–45.0)
Hemoglobin: 13 g/dL (ref 11.7–15.5)
Lymphs Abs: 2110 cells/uL (ref 850–3900)
MCH: 25.8 pg — ABNORMAL LOW (ref 27.0–33.0)
MCHC: 32.7 g/dL (ref 32.0–36.0)
MCV: 79 fL — ABNORMAL LOW (ref 80.0–100.0)
MPV: 10.5 fL (ref 7.5–12.5)
Monocytes Relative: 7.7 %
Neutro Abs: 4797 cells/uL (ref 1500–7800)
Neutrophils Relative %: 62.3 %
Platelets: 327 10*3/uL (ref 140–400)
RBC: 5.04 10*6/uL (ref 3.80–5.10)
RDW: 12.5 % (ref 11.0–15.0)
Total Lymphocyte: 27.4 %
WBC: 7.7 10*3/uL (ref 3.8–10.8)

## 2020-09-19 LAB — BASIC METABOLIC PANEL WITH GFR
BUN: 16 mg/dL (ref 7–25)
CO2: 30 mmol/L (ref 20–32)
Calcium: 9.6 mg/dL (ref 8.6–10.4)
Chloride: 98 mmol/L (ref 98–110)
Creat: 0.95 mg/dL (ref 0.50–0.99)
GFR, Est African American: 73 mL/min/{1.73_m2} (ref >=60)
GFR, Est Non African American: 63 mL/min/{1.73_m2} (ref >=60)
Glucose, Bld: 409 mg/dL — ABNORMAL HIGH (ref 65–99)
Potassium: 4.2 mmol/L (ref 3.5–5.3)
Sodium: 135 mmol/L (ref 135–146)

## 2020-09-19 LAB — TSH: TSH: 1.71 mIU/L (ref 0.40–4.50)

## 2020-09-19 LAB — VITAMIN D 25 HYDROXY (VIT D DEFICIENCY, FRACTURES): Vit D, 25-Hydroxy: 15 ng/mL — ABNORMAL LOW (ref 30–100)

## 2020-09-19 LAB — T4, FREE: Free T4: 1.1 ng/dL (ref 0.8–1.8)

## 2020-09-22 ENCOUNTER — Other Ambulatory Visit: Payer: Self-pay | Admitting: Family Medicine

## 2020-09-22 DIAGNOSIS — E1165 Type 2 diabetes mellitus with hyperglycemia: Secondary | ICD-10-CM

## 2020-09-22 DIAGNOSIS — E559 Vitamin D deficiency, unspecified: Secondary | ICD-10-CM

## 2020-09-22 MED ORDER — VITAMIN D (ERGOCALCIFEROL) 1.25 MG (50000 UNIT) PO CAPS: 50000.0000 [IU] | ORAL_CAPSULE | ORAL | 0 refills | Status: DC

## 2020-09-22 MED ORDER — METFORMIN HCL 500 MG PO TABS: 500.0000 mg | ORAL_TABLET | Freq: Every day | ORAL | 3 refills | Status: DC

## 2020-09-24 ENCOUNTER — Other Ambulatory Visit: Payer: Self-pay

## 2020-09-24 ENCOUNTER — Encounter: Payer: Self-pay | Admitting: Physical Therapy

## 2020-09-24 ENCOUNTER — Ambulatory Visit (INDEPENDENT_AMBULATORY_CARE_PROVIDER_SITE_OTHER): Payer: BC Managed Care – PPO | Admitting: Physical Therapy

## 2020-09-24 DIAGNOSIS — M545 Low back pain, unspecified: Secondary | ICD-10-CM | POA: Diagnosis not present

## 2020-09-24 DIAGNOSIS — G8929 Other chronic pain: Secondary | ICD-10-CM

## 2020-09-24 DIAGNOSIS — M542 Cervicalgia: Secondary | ICD-10-CM | POA: Diagnosis not present

## 2020-09-24 DIAGNOSIS — M6281 Muscle weakness (generalized): Secondary | ICD-10-CM

## 2020-09-24 DIAGNOSIS — R2689 Other abnormalities of gait and mobility: Secondary | ICD-10-CM | POA: Diagnosis not present

## 2020-09-24 NOTE — Therapy (Signed)
[facility] Physical Therapy [address] [place], [place], [address] Phone: [phone]   Fax:  [phone]  Physical Therapy Treatment/Discharge  PHYSICAL THERAPY DISCHARGE SUMMARY  Visits from Start of Care: 8  Current functional level related to goals / functional outcomes: See below   Remaining deficits: See below  Education / Equipment: HEP Plan: Patient agrees to discharge.  Patient goals were met. Patient is being discharged due to the patient's request.  ?????       Patient Details  Name: Rachel Fields MRN: [id number] Date of Birth: [birth year] Referring Provider (PT): Leandrew Koyanagi, MD   Encounter Date: 09/24/2020   PT End of Session - 09/24/20 1127    Visit Number 8    Number of Visits 12    Date for PT Re-Evaluation 10/06/20    PT Start Time 1100    PT Stop Time 1145    PT Time Calculation (min) 45 min    Activity Tolerance Patient tolerated treatment well    Behavior During Therapy [facility] for tasks assessed/performed           Past Medical History:  Diagnosis Date  . Allergy   . Anxiety   . Anxiety and depression   . Arthritis   . Depression   . HTN (hypertension)   . Hx of iron deficiency anemia    reports [place] trait; reports on iron her whole life  . Hypertension   . Low back pain    chronic, reports hx DDD treated by chiropractor  . No blood products    Jehovah's Witness  . OAB (overactive bladder)   . Seasonal allergies   . Sickle cell trait ([place])   . Uterine fibroid     Past Surgical History:  Procedure Laterality Date  . BUNIONECTOMY Bilateral 1987  . CESAREAN SECTION    . Teeth extracted   01/14/2017  . TUBAL LIGATION    . TUMOR REMOVAL     behind left eye    There were no vitals filed for this visit.   Subjective Assessment - 09/24/20 1126    Subjective Relays she still has some pain but it is overall better and that today will be her last session.    Pertinent History PMH: DM,HTTN,    Diagnostic tests  CT of neck and back on 06/11/20 show" no acute fx, mild degenerative changes in her neck and Prominent lumbar spondylosis and facet hypertrophy in lumbar".    Pain Onset More than a month ago    Pain Onset More than a month ago              [facility] PT Assessment - 09/24/20 0001      Assessment   Medical Diagnosis Neck and low back pain    Referring Provider (PT) Leandrew Koyanagi, MD    Onset Date/Surgical Date 06/11/20      AROM   Overall AROM Comments cervical and lumbar ROM WFL and at least 75% available      Strength   Overall Strength Comments UE/LE strength 4+ overall grossly                         [facility] Adult PT Treatment/Exercise - 09/24/20 0001      Neck Exercises: Standing   Other Standing Exercises scapular retraction 5 sec holds 2X10      Neck Exercises: Seated   Other Seated Exercise upper trap stretch 30 sec X 2 bilat  Lumbar Exercises: Stretches   Single Knee to Chest Stretch Right;Left;2 reps;30 seconds    Lower Trunk Rotation 10 seconds;5 reps    Other Lumbar Stretch Exercise sidelying rotational stretch Lt sidelying 10 sec X 5      Lumbar Exercises: Aerobic   Nustep Lvl 5 10 mins LE/UE      Lumbar Exercises: Standing   Other Standing Lumbar Exercises repeated extension 2X10      Lumbar Exercises: Supine   Bridge 20 reps;5 seconds      Moist Heat Therapy   Number Minutes Moist Heat 10 Minutes    Moist Heat Location Lumbar Spine      Electrical Stimulation   Electrical Stimulation Location low back    Electrical Stimulation Action IFC    Electrical Stimulation Parameters tolerance 10 min with heat    Electrical Stimulation Goals Pain                    PT Short Term Goals - 09/24/20 1145      PT SHORT TERM GOAL #1   Title Pt will be I with initial HEP    Time 4    Period Weeks    Status Achieved    Target Date 09/08/20      PT SHORT TERM GOAL #2   Title Pt will report 25% reduced pain neck and back    Time 4     Period Weeks    Status Achieved      PT SHORT TERM GOAL #3   Title -      PT SHORT TERM GOAL #4   Title -             PT Long Term Goals - 09/24/20 1145      PT LONG TERM GOAL #1   Title Pt will be I with advanced HEP    Status Achieved      PT LONG TERM GOAL #2   Title will report overall 50% improvment in neck/back pain    Status Partially Met      PT LONG TERM GOAL #3   Title Will improve neck/back ROM to [facility].    Status Achieved      PT LONG TERM GOAL #4   Title Will improve overall UE/LE strength to 4+/5 MMT    Status Achieved                 Plan - 09/24/20 1131    Clinical Impression Statement She has improved some with pain over the last week but still limited some with pain, this should hopefully improve more with time and continued exercise with HEP. She says this will be her last visit. HEP was reviewed today and she had no further questions and will be discharged today.    Personal Factors and Comorbidities Comorbidity 2    Comorbidities PMH: DM,HTN,    Examination-Activity Limitations [place];Caring for Others;Carry;Lift;Stand;Stairs;Squat;Sleep;Locomotion Level;Transfers    Examination-Participation Restrictions Cleaning;Occupation;Driving;Laundry;Shop    Stability/Clinical Decision Making Evolving/Moderate complexity    Rehab Potential Good    PT Frequency 2x / week    PT Duration 8 weeks    PT Treatment/Interventions ADLs/Self Care Home Management;Cryotherapy;Electrical Stimulation;Iontophoresis 19m/ml Dexamethasone;Moist Heat;Traction;Ultrasound;Therapeutic activities;Therapeutic exercise;Neuromuscular re-education;Balance training;Manual techniques;Passive range of motion;Dry needling;Joint Manipulations;Spinal Manipulations    PT Next Visit Plan Continue to promote improved activity tolerance c focus on lumbar mobility gains.    PT Home Exercise Plan Access Code: [email or web address]   Consulted and Agree with Plan of  Care Patient           Patient will  benefit from skilled therapeutic intervention in order to improve the following deficits and impairments:  Decreased activity tolerance,Decreased mobility,Decreased endurance,Decreased range of motion,Decreased strength,Difficulty walking,Increased muscle spasms,Impaired flexibility,Postural dysfunction,Improper body mechanics,Pain  Visit Diagnosis: Chronic bilateral low back pain, unspecified whether sciatica present  Cervicalgia  Other abnormalities of gait and mobility  Muscle weakness (generalized)     Problem List Patient Active Problem List   Diagnosis Date Noted  . Unilateral primary osteoarthritis, right hip 09/27/2018  . Diabetes mellitus without complication ([place]) 59/97/8776  . Hypertension associated with diabetes ([place]) 12/08/2017  . BMI 32.0-32.9,adult 12/08/2017  . Seasonal allergies 01/21/2015  . OAB (overactive bladder) 01/21/2015  . Hyperlipidemia associated with type 2 diabetes mellitus ([place]) 02/14/2014  . Obesity 12/19/2009  . LOW BACK PAIN 03/18/2008  . DEPRESSION/ANXIETY 06/15/2007  . HYPERTENSION, BENIGN 06/15/2007    Silvestre Mesi 09/24/2020, 11:46 AM  [facility] Physical Therapy [address] [place], [place], [address] Phone: [phone]   Fax:  [phone]  Name: Rachel Fields MRN: [id number] Date of Birth: 1956/05/28

## 2020-09-25 ENCOUNTER — Other Ambulatory Visit: Payer: Self-pay | Admitting: Family Medicine

## 2020-09-25 DIAGNOSIS — F419 Anxiety disorder, unspecified: Secondary | ICD-10-CM

## 2020-09-25 DIAGNOSIS — F32A Depression, unspecified: Secondary | ICD-10-CM

## 2020-09-26 ENCOUNTER — Other Ambulatory Visit: Payer: Self-pay | Admitting: Family Medicine

## 2020-09-26 DIAGNOSIS — I10 Essential (primary) hypertension: Secondary | ICD-10-CM

## 2020-09-29 ENCOUNTER — Other Ambulatory Visit: Payer: Self-pay

## 2020-09-29 DIAGNOSIS — I10 Essential (primary) hypertension: Secondary | ICD-10-CM

## 2020-09-29 MED ORDER — IRBESARTAN 150 MG PO TABS: 150.0000 mg | ORAL_TABLET | Freq: Every day | ORAL | 0 refills | Status: DC

## 2020-09-30 NOTE — Telephone Encounter (Signed)
Refill request for  Venlafaxine 25 mg LR 08/08/20, # 30, 0 rf LOV  23/9/21  FOV 10/08/20  Please advise.   Thanks.  Dm/cma

## 2020-10-01 ENCOUNTER — Other Ambulatory Visit: Payer: Self-pay | Admitting: Family Medicine

## 2020-10-01 DIAGNOSIS — N3281 Overactive bladder: Secondary | ICD-10-CM

## 2020-10-08 ENCOUNTER — Ambulatory Visit: Payer: BC Managed Care – PPO | Admitting: Family Medicine

## 2020-10-16 NOTE — Progress Notes (Addendum)
Subjective:    Patient ID: Rachel Fields, female    DOB: 11-05-1955, 65 y.o.   MRN: [id number]  No chief complaint on file.   HPI Patient is a 65 yo female with pmh sig for HTN, HLD, DM 2, OA, OAB, seasonal allergies, sickle cell trait who was seen for follow-up on BP.  Patient states she has been doing well overall.  Notes increased bladder leakage.  Not drinking any sodas or eating fried foods.  Eating breakfast and increasing water intake.  Patient endorses having 8 and 9 lb boys via SVD.  Has been out of Detrol x1 month.  Patient endorses physical therapy for low back pain 2/2 slipped disc.  Requesting refill on naproxen.  Patient sleeping 8-10 hours per night, napping during the day, and being told she snores at night.  Endorses fatigue as the day progresses.  BP typically 130s-140s/89.  Requesting plain area on side of mouth.  Endorses skin being dry, increased thirst, and urination.  A1C was 7/7% on 05/16/20, not on meds.  Past Medical History:  Diagnosis Date  . Allergy   . Anxiety   . Anxiety and depression   . Arthritis   . Depression   . HTN (hypertension)   . Hx of iron deficiency anemia    reports [place] trait; reports on iron her whole life  . Hypertension   . Low back pain    chronic, reports hx DDD treated by chiropractor  . No blood products    Jehovah's Witness  . OAB (overactive bladder)   . Seasonal allergies   . Sickle cell trait ([place])   . Uterine fibroid     No Known Allergies  ROS General: Denies fever, chills, night sweats, changes in weight, changes in appetite  + fatigue, snoring, daytime somnolence HEENT: Denies headaches, ear pain, changes in vision, rhinorrhea, sore throat  + increased thirst CV: Denies CP, palpitations, SOB, orthopnea Pulm: Denies SOB, cough, wheezing GI: Denies abdominal pain, nausea, vomiting, diarrhea, constipation GU: Denies dysuria, hematuria, vaginal discharge  + increased urination, bladder leakage Msk: Denies muscle cramps, joint  pains  + low back pain Neuro: Denies weakness, numbness, tingling Skin: Denies rashes, bruising Psych: Denies depression, anxiety, hallucinations      Objective:    Blood pressure 130/62, pulse 93, temperature 98.1 F (36.7 C), temperature source Oral, weight 198 lb 9.6 oz (90.1 kg), SpO2 99 %.  Gen. Pleasant, well-nourished, in no distress, normal affect   HEENT: [place]/AT, face symmetric, conjunctiva clear, no scleral icterus, PERRLA, EOMI, nares patent without drainage, pharynx without erythema or exudate. Lungs: no accessory muscle use, CTAB, no wheezes or rales Cardiovascular: RRR, no m/r/g, no peripheral edema Abdomen: BS present, soft, NT/ND, no hepatosplenomegaly. Musculoskeletal: No deformities, no cyanosis or clubbing, normal tone Neuro:  A&Ox3, CN II-XII intact, normal gait Skin:  Warm, dry, intact.  Hyperpigmented area left side of mouth.  Diabetic Foot Exam - Simple   Simple Foot Form Diabetic Foot exam was performed with the following findings: Yes 09/18/2020  4:02 PM  Visual Inspection See comments: Yes Sensation Testing See comments: Yes Pulse Check See comments: Yes Comments Intact to monofilament.  Vibratory sense decreased b/l.  Diminished PT and DP pulses, 1+ b/l.  Skin dry.  Discolored nails.      Wt Readings from Last 3 Encounters:  09/18/20 198 lb 9.6 oz (90.1 kg)  06/25/20 203 lb (92.1 kg)  06/11/20 200 lb (90.7 kg)    Lab Results  Component Value  Date   WBC 7.7 09/18/2020   HGB 13.0 09/18/2020   HCT 39.8 09/18/2020   PLT 327 09/18/2020   GLUCOSE 409 (H) 09/18/2020   CHOL 224 (H) 05/16/2020   TRIG 230 (H) 05/16/2020   HDL 51 05/16/2020   LDLCALC 136 (H) 05/16/2020   ALT 28 05/16/2020   AST 23 05/16/2020   NA 135 09/18/2020   K 4.2 09/18/2020   CL 98 09/18/2020   CREATININE 0.95 09/18/2020   BUN 16 09/18/2020   CO2 30 09/18/2020   TSH 1.71 09/18/2020   HGBA1C 13.2 (H) 09/18/2020   MICROALBUR 1.4 05/16/2020    Assessment/Plan:  Type  2 diabetes mellitus with other specified complication, without long-term current use of insulin (HCC)  -hgb A1C 7.7% on 05/16/20 -discussed the importance of lifestyle modifications -rx for diabetic shoes -continue Metformin 500 mg daily - Plan: Hemoglobin A1c, Podiatry  Dry skin  -discussed using a gentle moisturizer, increase po intake of water - Plan: TSH, T4, free, Hemoglobin A1c  Snoring  -Consider sleep study -Discussed lifestyle modifications including weight loss - Plan: CBC with Differential/Platelet  Daytime somnolence  -Consider sleep study -Lifestyle modifications including weight loss, increasing physical activity - Plan: TSH, T4, free, Vitamin D, 25-hydroxy, CBC with Differential/Platelets  OAB (overactive bladder)  -discussed bladder training -continue detrol la -reduce caffeine intake, spicy foods - Plan: tolterodine (DETROL LA) 4 MG 24 hr capsule  Essential hypertension -Elevated -Continue lifestyle modifications -Continue current medications including Norvasc 10 mg, irbesartan 150 mg. -For continued elevation consider switching venlafaxine to another medication - Plan: BMP with eGFR([facility]), BMP with eGFR(Quest)  Chronic low back pain without sciatica, unspecified back pain laterality -Continue supportive care -Discussed using NSAIDs sparingly given history of HTN -Consider water aerobics -Continue follow-up with PT - Plan: naproxen (NAPROSYN) 500 MG tablet  Dermatitis  - Plan: hydrocortisone 1 % lotion  F/u prn in 1-2 months  Grier Mitts, MD

## 2020-10-18 NOTE — Addendum Note (Signed)
Addended by: Grier Mitts R on: 10/18/2020 10:42 PM   Modules accepted: Orders

## 2020-10-21 ENCOUNTER — Other Ambulatory Visit: Payer: Self-pay | Admitting: Family Medicine

## 2020-10-21 DIAGNOSIS — F419 Anxiety disorder, unspecified: Secondary | ICD-10-CM

## 2020-10-21 DIAGNOSIS — F32A Depression, unspecified: Secondary | ICD-10-CM

## 2020-10-22 ENCOUNTER — Ambulatory Visit: Payer: BC Managed Care – PPO | Admitting: Family Medicine

## 2020-10-22 DIAGNOSIS — Z0289 Encounter for other administrative examinations: Secondary | ICD-10-CM

## 2020-10-23 NOTE — Telephone Encounter (Signed)
Ok for 90 days supply  

## 2020-11-04 ENCOUNTER — Other Ambulatory Visit: Payer: Self-pay

## 2020-11-05 ENCOUNTER — Ambulatory Visit: Payer: Self-pay | Admitting: Family Medicine

## 2020-11-06 ENCOUNTER — Other Ambulatory Visit: Payer: Self-pay | Admitting: Family Medicine

## 2020-11-06 DIAGNOSIS — I10 Essential (primary) hypertension: Secondary | ICD-10-CM

## 2020-11-17 ENCOUNTER — Ambulatory Visit: Payer: Self-pay | Admitting: Podiatry

## 2020-12-03 ENCOUNTER — Other Ambulatory Visit: Payer: Self-pay | Admitting: Family Medicine

## 2020-12-03 DIAGNOSIS — I10 Essential (primary) hypertension: Secondary | ICD-10-CM

## 2020-12-08 ENCOUNTER — Other Ambulatory Visit: Payer: Self-pay | Admitting: Family Medicine

## 2020-12-09 ENCOUNTER — Other Ambulatory Visit: Payer: Self-pay | Admitting: Family Medicine

## 2020-12-09 DIAGNOSIS — E559 Vitamin D deficiency, unspecified: Secondary | ICD-10-CM

## 2020-12-10 ENCOUNTER — Other Ambulatory Visit: Payer: Self-pay

## 2020-12-11 ENCOUNTER — Other Ambulatory Visit (HOSPITAL_COMMUNITY)
Admission: RE | Admit: 2020-12-11 | Discharge: 2020-12-11 | Disposition: A | Discharge status: Home / Self Care | Payer: BC Managed Care – PPO | Source: Ambulatory Visit | Attending: Family Medicine | Admitting: Family Medicine

## 2020-12-11 ENCOUNTER — Ambulatory Visit (INDEPENDENT_AMBULATORY_CARE_PROVIDER_SITE_OTHER): Payer: BC Managed Care – PPO | Admitting: Family Medicine

## 2020-12-11 ENCOUNTER — Encounter: Payer: Self-pay | Admitting: Family Medicine

## 2020-12-11 VITALS — BP 135/80 | HR 95 | Temp 98.2°F | Wt 182.0 lb

## 2020-12-11 DIAGNOSIS — N761 Subacute and chronic vaginitis: Secondary | ICD-10-CM | POA: Insufficient documentation

## 2020-12-11 DIAGNOSIS — B373 Candidiasis of vulva and vagina: Secondary | ICD-10-CM | POA: Insufficient documentation

## 2020-12-11 DIAGNOSIS — N898 Other specified noninflammatory disorders of vagina: Secondary | ICD-10-CM | POA: Diagnosis not present

## 2020-12-11 DIAGNOSIS — Z113 Encounter for screening for infections with a predominantly sexual mode of transmission: Secondary | ICD-10-CM | POA: Insufficient documentation

## 2020-12-11 DIAGNOSIS — B9689 Other specified bacterial agents as the cause of diseases classified elsewhere: Secondary | ICD-10-CM | POA: Insufficient documentation

## 2020-12-11 DIAGNOSIS — E1169 Type 2 diabetes mellitus with other specified complication: Secondary | ICD-10-CM

## 2020-12-11 MED ORDER — FLUCONAZOLE 150 MG PO TABS: ORAL_TABLET | ORAL | 0 refills | Status: AC

## 2020-12-11 NOTE — Patient Instructions (Signed)
Vaginitis  Vaginitis is a condition in which the vaginal tissue swells and becomes irritated. This condition is most often caused by a change in the normal balance of bacteria and yeast that live in the vagina. This change causes an overgrowth of certain bacteria or yeast, which causes the inflammation. There are different types of vaginitis. What are the causes? The cause of this condition depends on the type of vaginitis. It can be caused by:  Bacteria (bacterial vaginosis).  Yeast, which is a fungus (candidiasis).  A parasite (trichomoniasis vaginitis).  A virus (viral vaginitis).  Low hormone levels (atrophic vaginitis). Low hormone levels can occur during pregnancy, breastfeeding, or after menopause.  Irritants, such as bubble baths, scented tampons, and feminine sprays (allergic vaginitis). Other factors can change the normal balance of the yeast and bacteria that live in the vagina. These include:  Antibiotic medicines.  Poor hygiene.  Diaphragms, vaginal sponges, spermicides, birth control pills, and intrauterine devices (IUDs).  Sex.  Infection.  Uncontrolled diabetes.  A weakened body defense system (immune system). What increases the risk? This condition is more likely to develop in women who:  Smoke or are exposed to secondhand smoke.  Use vaginal douches, scented tampons, or scented sanitary pads.  Wear tight-fitting pants or thong underwear.  Use oral birth control pills or an IUD.  Have sex without a condom or have multiple partners.  Have an STI.  Frequently use the spermicide nonoxynol-9.  Eat lots of foods high in sugar or who have uncontrolled diabetes.  Have low estrogen levels.  Have a weakened immune system from an immune disorder or medical treatment.  Are pregnant or breastfeeding. What are the signs or symptoms? Symptoms vary depending on the cause of the vaginitis. Common symptoms include:  Abnormal vaginal discharge. ? The  discharge is white, gray, or yellow with bacterial vaginosis. ? The discharge is thick, white, and cheesy with a yeast infection. ? The discharge is frothy and yellow or greenish with trichomoniasis.  A bad vaginal smell. The smell is fishy with bacterial vaginosis.  Vaginal itching, pain, or swelling.  Pain with sex.  Pain or burning when urinating. Sometimes there are no symptoms. How is this diagnosed? This condition is diagnosed based on your symptoms and medical history. A physical exam, including a pelvic exam, will also be done. You may also have other tests, including:  Tests to determine the pH level (acidity or alkalinity) of your vagina.  A whiff test to assess the odor that results when a sample of your vaginal discharge is mixed with a potassium hydroxide solution.  Tests of vaginal fluid. A sample will be examined under a microscope. How is this treated? Treatment varies depending on the type of vaginitis you have. Your treatment may include:  Antibiotic creams or pills to treat bacterial vaginosis and trichomoniasis.  Antifungal medicines, such as vaginal creams or suppositories, to treat a yeast infection.  Medicine to ease discomfort if you have viral vaginitis. Your sexual partner should also be treated.  Estrogen delivered in a cream, pill, suppository, or vaginal ring to treat atrophic vaginitis. If vaginal dryness occurs, lubricants and moisturizing creams may help. You may need to avoid scented soaps, sprays, or douches.  Stopping use of a product that is causing allergic vaginitis and then using a vaginal cream to treat the symptoms. Follow these instructions at home: Lifestyle  Keep your genital area clean and dry. Avoid soap, and only rinse the area with water.  Do not douche  or use tampons until your health care provider says it is okay. Use sanitary pads, if needed.  Do not have sex until your health care provider approves. When you can return to sex,  practice safe sex and use condoms.  Wipe from front to back. This avoids the spread of bacteria from the rectum to the vagina. General instructions  Take over-the-counter and prescription medicines only as told by your health care provider.  If you were prescribed an antibiotic medicine, take or use it as told by your health care provider. Do not stop taking or using the antibiotic even if you start to feel better.  Keep all follow-up visits. This is important. How is this prevented?  Use mild, unscented products. Do not use things that can irritate the vagina, such as fabric softeners. Avoid the following products if they are scented: ? Feminine sprays. ? Detergents. ? Tampons. ? Feminine hygiene products. ? Soaps or bubble baths.  Let air reach your genital area. To do this: ? Wear cotton underwear to reduce moisture buildup. ? Avoid wearing underwear while you sleep. ? Avoid wearing tight pants and underwear or nylons without a cotton panel. ? Avoid wearing thong underwear.  Take off any wet clothing, such as bathing suits, as soon as possible.  Practice safe sex and use condoms. Contact a health care provider if:  You have abdominal or pelvic pain.  You have a fever or chills.  You have symptoms that last for more than 2-3 days. Get help right away if:  You have a fever and your symptoms suddenly get worse. Summary  Vaginitis is a condition in which the vaginal tissue becomes inflamed.This condition is most often caused by a change in the normal balance of bacteria and yeast that live in the vagina.  Treatment varies depending on the type of vaginitis you have.  Do not douche, use tampons, or have sex until your health care provider approves. When you can return to sex, practice safe sex and use condoms. This information is not intended to replace advice given to you by your health care provider. Make sure you discuss any questions you have with your health care  provider. Document Revised: 03/27/2020 Document Reviewed: 03/27/2020 Elsevier Patient Education  [place].

## 2020-12-15 ENCOUNTER — Other Ambulatory Visit: Payer: Self-pay | Admitting: Family Medicine

## 2020-12-15 DIAGNOSIS — B9689 Other specified bacterial agents as the cause of diseases classified elsewhere: Secondary | ICD-10-CM

## 2020-12-15 LAB — CERVICOVAGINAL ANCILLARY ONLY
Bacterial Vaginitis (gardnerella): POSITIVE — AB
Candida Glabrata: NEGATIVE
Candida Vaginitis: POSITIVE — AB
Chlamydia: NEGATIVE
Comment: NEGATIVE
Comment: NEGATIVE
Comment: NEGATIVE
Comment: NEGATIVE
Comment: NEGATIVE
Comment: NORMAL
Neisseria Gonorrhea: NEGATIVE
Trichomonas: NEGATIVE

## 2020-12-15 MED ORDER — METRONIDAZOLE 500 MG PO TABS: 500.0000 mg | ORAL_TABLET | Freq: Two times a day (BID) | ORAL | 0 refills | Status: AC

## 2020-12-16 NOTE — Progress Notes (Signed)
Subjective:    Patient ID: Rachel Fields, female    DOB: [birth year], 65 y.o.   MRN: [id number]  Chief Complaint  Patient presents with  . Vaginal Itching    HPI Patient was seen today for acute concern.  Pt endorses vaginal pruritis and irritation x 4 wks. Tried hydrocortisone cream which helped.  Denies vaginal discharge, dysuria, nausea, vomiting, changes in soaps, lotions, or detergents.  Past Medical History:  Diagnosis Date  . Allergy   . Anxiety   . Anxiety and depression   . Arthritis   . Depression   . HTN (hypertension)   . Hx of iron deficiency anemia    reports [place] trait; reports on iron her whole life  . Hypertension   . Low back pain    chronic, reports hx DDD treated by chiropractor  . No blood products    Jehovah's Witness  . OAB (overactive bladder)   . Seasonal allergies   . Sickle cell trait ([place])   . Uterine fibroid     No Known Allergies  ROS General: Denies fever, chills, night sweats, changes in weight, changes in appetite HEENT: Denies headaches, ear pain, changes in vision, rhinorrhea, sore throat CV: Denies CP, palpitations, SOB, orthopnea Pulm: Denies SOB, cough, wheezing GI: Denies abdominal pain, nausea, vomiting, diarrhea, constipation GU: Denies dysuria, hematuria, frequency, vaginal discharge  + vaginal pruritus and edema. Msk: Denies muscle cramps, joint pains Neuro: Denies weakness, numbness, tingling Skin: Denies rashes, bruising Psych: Denies depression, anxiety, hallucinations      Objective:    Blood pressure 135/80, pulse 95, temperature 98.2 F (36.8 C), temperature source Oral, weight 182 lb (82.6 kg), SpO2 97 %.  Gen. Pleasant, well-nourished, in no distress, normal affect   HEENT: [place]/AT, face symmetric, conjunctiva clear, no scleral icterus, PERRLA, EOMI, nares patent without drainage Lungs: no accessory muscle use Cardiovascular: RRR, no peripheral edema BT:DVVOHY urethral meatus, labia majora and minora with edema and  fine whitish papules/discoloration, scant white d/c noted at introitus.  No lesions noted Musculoskeletal: No deformities, no cyanosis or clubbing, normal tone Neuro:  A&Ox3, CN II-XII intact, normal gait  Wt Readings from Last 3 Encounters:  12/11/20 182 lb (82.6 kg)  09/18/20 198 lb 9.6 oz (90.1 kg)  06/25/20 203 lb (92.1 kg)    Lab Results  Component Value Date   WBC 7.7 09/18/2020   HGB 13.0 09/18/2020   HCT 39.8 09/18/2020   PLT 327 09/18/2020   GLUCOSE 409 (H) 09/18/2020   CHOL 224 (H) 05/16/2020   TRIG 230 (H) 05/16/2020   HDL 51 05/16/2020   LDLCALC 136 (H) 05/16/2020   ALT 28 05/16/2020   AST 23 05/16/2020   NA 135 09/18/2020   K 4.2 09/18/2020   CL 98 09/18/2020   CREATININE 0.95 09/18/2020   BUN 16 09/18/2020   CO2 30 09/18/2020   TSH 1.71 09/18/2020   HGBA1C 13.2 (H) 09/18/2020   MICROALBUR 1.4 05/16/2020    Assessment/Plan:  Subacute vaginitis  - Plan: Cervicovaginal ancillary only, fluconazole (DIFLUCAN) 150 MG tablet  Type 2 diabetes mellitus with other specified complication, without long-term current use of insulin (HCC)  -Uncontrolled -Hgb A1C 13.2% on 09/18/20 -Hyperglycemia likely contributing to current vaginitis -Have patient return next week for follow-up 2/2 difficulty obtaining POC hemoglobin A1c and lab closing for the day -Continue lifestyle modifications and Metformin.  Discussed the need for additional medication. - Plan: POC HgB A1c  Vaginal pruritus -Discussed possible causes including Candida infection.  Also  consider Lichen planus given appearance of skin. -Consider biopsy/Derm f/u -Follow-up in 1 week for continued or worsening symptoms.  F/u prn next wk follow-up on pruritus and uncontrolled DM 2   Grier Mitts, MD

## 2020-12-18 ENCOUNTER — Telehealth: Payer: Self-pay | Admitting: Family Medicine

## 2020-12-18 ENCOUNTER — Other Ambulatory Visit: Payer: Self-pay | Admitting: Family Medicine

## 2020-12-18 ENCOUNTER — Ambulatory Visit: Payer: BC Managed Care – PPO | Admitting: Family Medicine

## 2020-12-18 DIAGNOSIS — I10 Essential (primary) hypertension: Secondary | ICD-10-CM

## 2020-12-18 NOTE — Telephone Encounter (Signed)
Patient had appointment scheduled for today at 4:00. Called to ask if Patient would like to move her appointment up.   Patient states that due to issues with her car she would like to reschedule. Patient rescheduled for 12/26/20 at 2:30 pm.

## 2020-12-20 ENCOUNTER — Telehealth: Payer: Self-pay | Admitting: Family Medicine

## 2020-12-20 NOTE — Telephone Encounter (Signed)
Call received from team health nurse line.  The patient contacted them regarding her Effexor prescription.  They report the patient noted she was previously on Effexor 225 mg once daily though with her most recent prescription she was prescribed 100 mg twice daily.  The patient was concerned regarding this thinking that she would have to take more of the Effexor 100 mg tablets to equal her prior dosing.  In review of the chart it looks like the Effexor was last sent in to the pharmacy in October 2021 and there were no refills attached to this.  I advised that the nurse check to see if the patient has been taking the Effexor consistently and if she has she should take the Effexor as prescribed as noted on the prescription bottle and she should not take more than prescribed.  The nurse noted that the patient was feeling depressed and I advised that she needs to check and see if the patient is having suicidal ideation and if she is she needs to be evaluated immediately in person in the ED.  She also reported the patient was having dizziness with vomiting and nausea and felt weak.  I advised that she needed to be evaluated in person today for that as well.  I encouraged the nurse to call me back with any additional questions or concerns.  I will forward to the patient's PCP so they can follow-up on this as it sounds as though the patient needs a follow-up visit as well.

## 2020-12-22 NOTE — Telephone Encounter (Signed)
Patient called back and is requesting a refill for Effexor capsules [facility], please advise. CB is [phone]

## 2020-12-23 NOTE — Telephone Encounter (Signed)
Pt is calling in needing her Rx venlafaxine (EFFEXOR) 75 MG 3 times a day.  [facility] [address]  Pt is requesting to have it sent in as capsule form instead of tablet.  Pt would like to have a call once it was called in.

## 2020-12-24 NOTE — Telephone Encounter (Signed)
Patient is requesting refill but what is on med list does not match. Please advise

## 2020-12-25 ENCOUNTER — Other Ambulatory Visit: Payer: Self-pay

## 2020-12-25 DIAGNOSIS — F419 Anxiety disorder, unspecified: Secondary | ICD-10-CM

## 2020-12-25 DIAGNOSIS — F32A Depression, unspecified: Secondary | ICD-10-CM

## 2020-12-25 MED ORDER — VENLAFAXINE HCL 100 MG PO TABS: 100.0000 mg | ORAL_TABLET | Freq: Two times a day (BID) | ORAL | 0 refills | Status: DC

## 2020-12-25 NOTE — Telephone Encounter (Signed)
Spoke with patient and got clarification on Rx. Called pharmacy and refill was sent.

## 2020-12-25 NOTE — Telephone Encounter (Signed)
Spoke with patient, got better understanding of request. Refill sent to pharmacy.

## 2020-12-25 NOTE — Telephone Encounter (Signed)
Patient wants a call back ASAP about her medication for   venlafaxine (EFFEXOR) 100 MG tablet  venlafaxine (EFFEXOR) 25 MG tablet  She said that she needs the right one called in and it almost killed her. I tried to get more information from the patient but the patient is very upset and has been waiting all week to hear something from someone and didn't feel like trying to re explain what was going on with her medication.  Please advise

## 2020-12-25 NOTE — Progress Notes (Signed)
Spoke with patient, is aware of results and Rx.

## 2020-12-26 ENCOUNTER — Ambulatory Visit: Payer: BC Managed Care – PPO | Admitting: Family Medicine

## 2020-12-26 NOTE — Telephone Encounter (Signed)
Per chart review, pt previously on Effexor ER 75 mg 3 caps per day, but requested immediate release tabs 2/2 cost, hence patient started on Effexor for 112.5 g twice daily (Effexor 100 mg + half a 25 mg tab BID).  Ok to restart Effexor ER 225 mg daily.

## 2021-01-10 DIAGNOSIS — Z111 Encounter for screening for respiratory tuberculosis: Secondary | ICD-10-CM | POA: Diagnosis not present

## 2021-01-12 DIAGNOSIS — Z111 Encounter for screening for respiratory tuberculosis: Secondary | ICD-10-CM | POA: Diagnosis not present

## 2021-01-12 NOTE — Telephone Encounter (Signed)
Patient still hasn't received her refill on venlafaxine XR (EFFEXOR-XR) 75 MG 24 hr capsule  [id number] [place] [place] - [address] [facility] [place] Phone:  [phone]  Fax:  [phone]

## 2021-01-13 NOTE — Telephone Encounter (Signed)
Called and spoke with pharmacist, Ailene Ravel, verified that refill was received and filled on 12/26/2020 for EFFEXOR-XR 75 mg capsules. Pharmacists also remembers the patient picking out what refills she wanted and which she did not, stated patient did pick up the refill on 12/26/2020 that is being requested.

## 2021-01-16 ENCOUNTER — Other Ambulatory Visit: Payer: Self-pay | Admitting: Family Medicine

## 2021-01-19 ENCOUNTER — Other Ambulatory Visit (HOSPITAL_COMMUNITY): Payer: Self-pay

## 2021-01-19 MED ORDER — MELOXICAM 15 MG PO TABS
15.0000 mg | ORAL_TABLET | Freq: Every day | ORAL | 0 refills | Status: AC
  Filled 2021-01-19: qty 60, 60d supply, fill #0

## 2021-01-19 MED ORDER — METHOCARBAMOL 500 MG PO TABS
500.0000 mg | ORAL_TABLET | Freq: Four times a day (QID) | ORAL | 1 refills | Status: AC
  Filled 2021-01-19: qty 60, 15d supply, fill #0

## 2021-01-26 ENCOUNTER — Telehealth: Payer: Self-pay | Admitting: Family Medicine

## 2021-01-26 NOTE — Telephone Encounter (Signed)
error 

## 2021-01-26 NOTE — Telephone Encounter (Signed)
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   [id number] [place] [place] [facility] [place] Phone:  [phone]  Fax:  [phone]

## 2021-01-28 ENCOUNTER — Other Ambulatory Visit: Payer: Self-pay

## 2021-01-28 ENCOUNTER — Emergency Department (HOSPITAL_COMMUNITY)
Admission: EM | Admit: 2021-01-28 | Discharge: 2021-01-28 | Disposition: A | Discharge status: Home / Self Care | Payer: BC Managed Care – PPO | Attending: Emergency Medicine | Admitting: Emergency Medicine

## 2021-01-28 ENCOUNTER — Encounter (HOSPITAL_COMMUNITY): Payer: Self-pay | Admitting: Emergency Medicine

## 2021-01-28 ENCOUNTER — Other Ambulatory Visit (HOSPITAL_COMMUNITY): Payer: Self-pay

## 2021-01-28 DIAGNOSIS — R5383 Other fatigue: Secondary | ICD-10-CM | POA: Diagnosis not present

## 2021-01-28 DIAGNOSIS — E1100 Type 2 diabetes mellitus with hyperosmolarity without nonketotic hyperglycemic-hyperosmolar coma (NKHHC): Secondary | ICD-10-CM | POA: Diagnosis not present

## 2021-01-28 DIAGNOSIS — E785 Hyperlipidemia, unspecified: Secondary | ICD-10-CM | POA: Diagnosis not present

## 2021-01-28 DIAGNOSIS — Z7984 Long term (current) use of oral hypoglycemic drugs: Secondary | ICD-10-CM | POA: Diagnosis not present

## 2021-01-28 DIAGNOSIS — E1165 Type 2 diabetes mellitus with hyperglycemia: Secondary | ICD-10-CM | POA: Diagnosis not present

## 2021-01-28 DIAGNOSIS — Z79899 Other long term (current) drug therapy: Secondary | ICD-10-CM | POA: Insufficient documentation

## 2021-01-28 DIAGNOSIS — E669 Obesity, unspecified: Secondary | ICD-10-CM | POA: Insufficient documentation

## 2021-01-28 DIAGNOSIS — I10 Essential (primary) hypertension: Secondary | ICD-10-CM | POA: Diagnosis not present

## 2021-01-28 DIAGNOSIS — E1169 Type 2 diabetes mellitus with other specified complication: Secondary | ICD-10-CM | POA: Diagnosis not present

## 2021-01-28 LAB — CBC WITH DIFFERENTIAL/PLATELET
Abs Immature Granulocytes: 0.02 10*3/uL (ref 0.00–0.07)
Basophils Absolute: 0 10*3/uL (ref 0.0–0.1)
Basophils Relative: 0 %
Eosinophils Absolute: 0.1 10*3/uL (ref 0.0–0.5)
Eosinophils Relative: 1 %
HCT: 42.1 % (ref 36.0–46.0)
Hemoglobin: 14.2 g/dL (ref 12.0–15.0)
Immature Granulocytes: 0 %
Lymphocytes Relative: 26 %
Lymphs Abs: 1.8 10*3/uL (ref 0.7–4.0)
MCH: 25.5 pg — ABNORMAL LOW (ref 26.0–34.0)
MCHC: 33.7 g/dL (ref 30.0–36.0)
MCV: 75.6 fL — ABNORMAL LOW (ref 80.0–100.0)
Monocytes Absolute: 0.5 10*3/uL (ref 0.1–1.0)
Monocytes Relative: 7 %
Neutro Abs: 4.5 10*3/uL (ref 1.7–7.7)
Neutrophils Relative %: 66 %
Platelets: 282 10*3/uL (ref 150–400)
RBC: 5.57 MIL/uL — ABNORMAL HIGH (ref 3.87–5.11)
RDW: 13.1 % (ref 11.5–15.5)
WBC: 6.9 10*3/uL (ref 4.0–10.5)
nRBC: 0 % (ref 0.0–0.2)

## 2021-01-28 LAB — COMPREHENSIVE METABOLIC PANEL
ALT: 23 U/L (ref 0–44)
AST: 17 U/L (ref 15–41)
Albumin: 3.8 g/dL (ref 3.5–5.0)
Alkaline Phosphatase: 120 U/L (ref 38–126)
Anion gap: 14 (ref 5–15)
BUN: 6 mg/dL — ABNORMAL LOW (ref 8–23)
CO2: 23 mmol/L (ref 22–32)
Calcium: 9.4 mg/dL (ref 8.9–10.3)
Chloride: 94 mmol/L — ABNORMAL LOW (ref 98–111)
Creatinine, Ser: 0.92 mg/dL (ref 0.44–1.00)
GFR, Estimated: >60 mL/min (ref >60)
Glucose, Bld: 663 mg/dL (ref 70–99)
Potassium: 3.7 mmol/L (ref 3.5–5.1)
Sodium: 131 mmol/L — ABNORMAL LOW (ref 135–145)
Total Bilirubin: 0.3 mg/dL (ref 0.3–1.2)
Total Protein: 6.8 g/dL (ref 6.5–8.1)

## 2021-01-28 LAB — URINALYSIS, ROUTINE W REFLEX MICROSCOPIC
Bacteria, UA: NONE SEEN
Bilirubin Urine: NEGATIVE
Glucose, UA: >=500 mg/dL — AB
Hgb urine dipstick: NEGATIVE
Ketones, ur: NEGATIVE mg/dL
Leukocytes,Ua: NEGATIVE
Nitrite: NEGATIVE
Protein, ur: NEGATIVE mg/dL
Specific Gravity, Urine: 1.025 (ref 1.005–1.030)
pH: 6 (ref 5.0–8.0)

## 2021-01-28 LAB — LIPASE, BLOOD: Lipase: 36 U/L (ref 11–51)

## 2021-01-28 LAB — CBG MONITORING, ED
Glucose-Capillary: 484 mg/dL — ABNORMAL HIGH (ref 70–99)
Glucose-Capillary: 379 mg/dL — ABNORMAL HIGH (ref 70–99)

## 2021-01-28 MED ORDER — SODIUM CHLORIDE 0.9 % IV BOLUS
2000.0000 mL | Freq: Once | INTRAVENOUS | Status: AC
  Administered 2021-01-28: 2000 mL via INTRAVENOUS

## 2021-01-28 MED ORDER — INSULIN ASPART 100 UNIT/ML ~~LOC~~ SOLN
10.0000 [IU] | Freq: Once | SUBCUTANEOUS | Status: AC
  Administered 2021-01-28: 10 [IU] via SUBCUTANEOUS

## 2021-01-28 MED ORDER — INSULIN ASPART 100 UNIT/ML ~~LOC~~ SOLN
5.0000 [IU] | Freq: Once | SUBCUTANEOUS | Status: AC
  Administered 2021-01-28: 5 [IU] via SUBCUTANEOUS

## 2021-01-28 MED ORDER — ONDANSETRON 4 MG PO TBDP
4.0000 mg | ORAL_TABLET | Freq: Once | ORAL | Status: DC
  Filled 2021-01-28: qty 1

## 2021-01-28 MED ORDER — ONDANSETRON HCL 4 MG/2ML IJ SOLN
4.0000 mg | Freq: Once | INTRAMUSCULAR | Status: AC
  Administered 2021-01-28: 4 mg via INTRAVENOUS
  Filled 2021-01-28: qty 2

## 2021-01-28 NOTE — ED Notes (Signed)
Medications follow up appts reviewed w/ pt. Denies questions or concerns @ this time. Education on s/s of worsening and when to return. Evaluated for nausea. Pt found to have FSBS in 600's, fsbs down to 379. Given 2L bolus and 15 total units insulin. Education given on management of blood sugar, A/0 x4, verbalized understanding.   Left w/ even and steady gait. NAD noted. PIV removed and VSS.

## 2021-01-28 NOTE — Discharge Instructions (Signed)
It is very important that you follow-up closely with your primary care provider for further managements of your diabetes.  You may benefit from additional diabetic medication which may include insulin.  Discussed with your doctor if a continuous glucose monitoring device will be appropriate for you.  Please avoid foods high in carbohydrate, avoid soda, exercise at least 30 to 45 minutes of aerobic exercise 4 days a week.  Return to the ER if you have any concern.

## 2021-01-28 NOTE — ED Triage Notes (Signed)
Emergency Medicine Provider Triage Evaluation Note  Rachel Fields , a 65 y.o. female  was evaluated in triage.  Pt complains of vomiting and abdominal pain.  Symptoms have been present for 1 week.  She has not been able to consistently keep anything down.  Reports associated generalized abdominal pain.  No fevers, some nonbloody diarrhea.  No chest pain.  She reports that they have frequently been changing the dose of her Effexor and she is worried that this is causing the nausea and vomiting.  Has not had any nausea medicine at home to try.  Review of Systems  Positive: Vomiting, abdominal pain, diarrhea Negative: Chest pain, fever, blood in stool  Physical Exam  BP 138/66   Pulse (!) 101   Temp 98.7 F (37.1 C) (Oral)   Resp 15   SpO2 99%  Gen:   Awake, no distress   HEENT:  Atraumatic  Resp:  Normal effort  Cardiac:  Normal rate  Abd:   Nondistended MSK:   Moves extremities without difficulty  Neuro:  Speech clear   Medical Decision Making  Medically screening exam initiated at 5:51 PM.  Appropriate orders placed.  Rachel Fields was informed that the remainder of the evaluation will be completed by another provider, this initial triage assessment does not replace that evaluation, and the importance of remaining in the ED until their evaluation is complete.  Clinical Impression  1.  Generalized abdominal pain 2.  Nausea and vomiting   Rachel Fields, [place] 01/28/21 1811

## 2021-01-28 NOTE — ED Triage Notes (Signed)
Pt here with c/o abd pain and n/v times 1 week , pt has had a recent dose change in her meds which she thinks may be the problem

## 2021-01-28 NOTE — ED Provider Notes (Signed)
[facility] EMERGENCY DEPARTMENT Provider Note   CSN: [id number] Arrival date & time: 01/28/21  1716     History No chief complaint on file.   Rachel Fields is a 65 y.o. female.  The history is provided by the patient and medical records. No language interpreter was used.     65 year old female significant history of diabetes on metformin, hypertension, hyperlipidemia, obesity, presents with complaints of generalized fatigue.  Patient states for the past 2 weeks she has had generalized fatigue, feeling nauseous, occasional vomiting, mild abdominal discomfort, polyuria, polydipsia, weight loss, and overall not feeling well.  No fever chills no chest pain shortness of breath no dysuria and no recent sickness.  States she was diagnosed with diabetes up about 3 weeks ago and is currently taking metformin.  She also report that her depression medication Effexor was recently changed in dosage which she is unsure if it is contributing her symptoms.  Past Medical History:  Diagnosis Date  . Allergy   . Anxiety   . Anxiety and depression   . Arthritis   . Depression   . HTN (hypertension)   . Hx of iron deficiency anemia    reports [place] trait; reports on iron her whole life  . Hypertension   . Low back pain    chronic, reports hx DDD treated by chiropractor  . No blood products    Jehovah's Witness  . OAB (overactive bladder)   . Seasonal allergies   . Sickle cell trait ([place])   . Uterine fibroid     Patient Active Problem List   Diagnosis Date Noted  . Unilateral primary osteoarthritis, right hip 09/27/2018  . Diabetes mellitus without complication ([place]) 71/69/6789  . Hypertension associated with diabetes ([place]) 12/08/2017  . BMI 32.0-32.9,adult 12/08/2017  . Seasonal allergies 01/21/2015  . OAB (overactive bladder) 01/21/2015  . Hyperlipidemia associated with type 2 diabetes mellitus ([place]) 02/14/2014  . Obesity 12/19/2009  . LOW BACK PAIN 03/18/2008  .  DEPRESSION/ANXIETY 06/15/2007  . HYPERTENSION, BENIGN 06/15/2007    Past Surgical History:  Procedure Laterality Date  . BUNIONECTOMY Bilateral 1987  . CESAREAN SECTION    . Teeth extracted   01/14/2017  . TUBAL LIGATION    . TUMOR REMOVAL     behind left eye     OB History   No obstetric history on file.     Family History  Problem Relation Age of Onset  . Diabetes Father   . Hypertension Father   . Dementia Father        senile  . Other Mother        smoker  . Lung cancer Mother        2010  . Bipolar disorder Sister   . Schizophrenia Sister   . Schizophrenia Sister   . Bipolar disorder Sister   . Schizophrenia Sister   . Bipolar disorder Sister   . Bipolar disorder Daughter   . Colon cancer Neg Hx     Social History   Tobacco Use  . Smoking status: Never Smoker  . Smokeless tobacco: Never Used  Vaping Use  . Vaping Use: Never used  Substance Use Topics  . Alcohol use: Yes    Comment: socially - rare; 2 drinks a few times per month  . Drug use: No    Home Medications Prior to Admission medications   Medication Sig Start Date End Date Taking? Authorizing Provider  amLODipine (NORVASC) 10 MG tablet TAKE 1 TABLET BY  MOUTH EVERY DAY 12/04/20   Billie Ruddy, MD  calcium-vitamin D (CALCIUM + D) 250-125 MG-UNIT per tablet Take 1 tablet by mouth daily. 02/14/14   Robbie Lis, MD  cyclobenzaprine (FLEXERIL) 5 MG tablet Take 1-2 tablets (5-10 mg total) by mouth 2 (two) times daily as needed for muscle spasms. 06/11/20   Hayden Rasmussen, MD  diclofenac sodium (VOLTAREN) 1 % GEL Apply 2 g topically 4 (four) times daily. 12/19/17   Leandrew Koyanagi, MD  Fe Fum-FA-B Cmp-C-Zn-Mg-Mn-Cu (HEMOCYTE PLUS) 106-1 MG CAPS Take 1 capsule by mouth daily. 02/14/14   Robbie Lis, MD  fluconazole (DIFLUCAN) 150 MG tablet Take one tab now.  Repeat dose in 3 days for continued symptoms. 12/11/20   Billie Ruddy, MD  fluticasone (FLONASE) 50 MCG/ACT nasal spray Place 1 spray into  both nostrils at bedtime. 04/21/20   Billie Ruddy, MD  irbesartan (AVAPRO) 150 MG tablet TAKE 1 TABLET BY MOUTH EVERY DAY 12/22/20   Billie Ruddy, MD  meloxicam (MOBIC) 15 MG tablet Take 1 tablet by mouth once a day with food for inflammation. (Start afer completion of medrol dose pack) 01/19/21     meloxicam (MOBIC) 7.5 MG tablet Take 1 tablet (7.5 mg total) by mouth daily. 06/25/20   Billie Ruddy, MD  metaxalone (SKELAXIN) 800 MG tablet TAKE 1 TABLET BY MOUTH THREE TIMES A DAY 08/25/20   Aundra Dubin, PA-C  metFORMIN (GLUCOPHAGE) 500 MG tablet Take 1 tablet (500 mg total) by mouth daily with breakfast. 09/22/20   Billie Ruddy, MD  methocarbamol (ROBAXIN) 500 MG tablet Take 1 tablet by mouth every six hours as needed for muscle tension/spasms (daytime use) 01/19/21     naproxen (NAPROSYN) 500 MG tablet Take 1 tablet (500 mg total) by mouth 2 (two) times daily as needed for moderate pain. 09/18/20   Billie Ruddy, MD  OVER THE COUNTER MEDICATION Take 2 tablets by mouth daily. Biotin 5056mcg    [provider]  rosuvastatin (CRESTOR) 10 MG tablet Take 1 tablet (10 mg total) by mouth daily. 08/29/20   Billie Ruddy, MD  tolterodine (DETROL LA) 4 MG 24 hr capsule TAKE 1 CAPSULE BY MOUTH EVERY DAY 10/06/20   Billie Ruddy, MD  venlafaxine XR (EFFEXOR-XR) 75 MG 24 hr capsule TAKE 3 CAPSULES EVERY MORNING WITH BREAKFAST 12/26/20   Billie Ruddy, MD  Vitamin D, Ergocalciferol, (DRISDOL) 1.25 MG (50000 UNIT) CAPS capsule Take 1 capsule (50,000 Units total) by mouth every 7 (seven) days. 09/22/20   Billie Ruddy, MD  lisinopril (ZESTRIL) 20 MG tablet Take 1 tablet (20 mg total) by mouth daily. 04/10/20 05/13/20  Billie Ruddy, MD    Allergies    Patient has no known allergies.  Review of Systems   Review of Systems  All other systems reviewed and are negative.   Physical Exam Updated Vital Signs BP (!) 137/91 (BP Location: Right Arm)   Pulse (!) 15   Temp 98.7  F (37.1 C) (Oral)   Resp 15   SpO2 100%   Physical Exam Vitals and nursing note reviewed.  Constitutional:      General: She is not in acute distress.    Appearance: She is well-developed. She is obese.  HENT:     Head: Atraumatic.  Eyes:     Conjunctiva/sclera: Conjunctivae normal.  Cardiovascular:     Rate and Rhythm: Normal rate and regular rhythm.  Pulses: Normal pulses.     Heart sounds: Normal heart sounds.  Pulmonary:     Effort: Pulmonary effort is normal.     Breath sounds: Normal breath sounds.  Abdominal:     Palpations: Abdomen is soft.     Tenderness: There is no abdominal tenderness.  Musculoskeletal:        General: Normal range of motion.     Cervical back: Neck supple.  Skin:    General: Skin is warm.     Findings: No rash.  Neurological:     Mental Status: She is alert and oriented to person, place, and time.  Psychiatric:        Mood and Affect: Mood normal.     ED Results / Procedures / Treatments   Labs (all labs ordered are listed, but only abnormal results are displayed) Labs Reviewed  COMPREHENSIVE METABOLIC PANEL - Abnormal; Notable for the following components:      Result Value   Sodium 131 (*)    Chloride 94 (*)    Glucose, Bld 663 (*)    BUN 6 (*)    All other components within normal limits  CBC WITH DIFFERENTIAL/PLATELET - Abnormal; Notable for the following components:   RBC 5.57 (*)    MCV 75.6 (*)    MCH 25.5 (*)    All other components within normal limits  URINALYSIS, ROUTINE W REFLEX MICROSCOPIC - Abnormal; Notable for the following components:   Color, Urine COLORLESS (*)    Glucose, UA >=500 (*)    All other components within normal limits  CBG MONITORING, ED - Abnormal; Notable for the following components:   Glucose-Capillary 484 (*)    All other components within normal limits  CBG MONITORING, ED - Abnormal; Notable for the following components:   Glucose-Capillary 379 (*)    All other components within normal  limits  LIPASE, BLOOD    EKG None  Radiology No results found.  Procedures .Critical Care Performed by: Domenic Moras, PA-C Authorized by: Domenic Moras, PA-C   Critical care provider statement:    Critical care time (minutes):  30   Critical care was time spent personally by me on the following activities:  Discussions with consultants, evaluation of patient's response to treatment, examination of patient, ordering and performing treatments and interventions, ordering and review of laboratory studies, ordering and review of radiographic studies, pulse oximetry, re-evaluation of patient's condition, obtaining history from patient or surrogate and review of old charts     Medications Ordered in ED Medications  ondansetron (ZOFRAN-ODT) disintegrating tablet 4 mg (has no administration in time range)  insulin aspart (novoLOG) injection 10 Units (10 Units Subcutaneous Given 01/28/21 2108)  sodium chloride 0.9 % bolus 2,000 mL (2,000 mLs Intravenous New Bag/Given 01/28/21 2123)  ondansetron (ZOFRAN) injection 4 mg (4 mg Intravenous Given 01/28/21 2124)  insulin aspart (novoLOG) injection 5 Units (5 Units Subcutaneous Given 01/28/21 2247)    ED Course  I have reviewed the triage vital signs and the nursing notes.  Pertinent labs & imaging results that were available during my care of the patient were reviewed by me and considered in my medical decision making (see chart for details).    MDM Rules/Calculators/A&P                          BP (!) 168/66   Pulse 76   Temp 98.2 F (36.8 C) (Oral)   Resp 14   SpO2  100%   Final Clinical Impression(s) / ED Diagnoses Final diagnoses:  Hyperosmolar hyperglycemic state (HHS) ([place])    Rx / DC Orders ED Discharge Orders    None     8:47 PM Patient here with polyuria, polydipsia, weight loss, and generalized fatigue and some abdominal discomfort for the past 2 weeks.  Her work-up today is remarkable for hyperglycemia with a CBG of 663,  normal anion gap, and no ketones in the urine.  She is mentating appropriately.  Symptom is consistent with hyperglycemic hyperosmolar nonketotic state.  She is mentating appropriately.  We will give IV fluid as well as insulin and will monitor closely  11:01 PM CBG has improved from 663 to 379 after patient received 2 L of fluid as well as 10 units of insulin.  She report feeling a bit better.  Additional 5 units of insulin will be given.  I spent time discussing different methods to better control her blood sugar which includes aerobic exercise, monitoring her caloric intake, avoid carbohydrate food, as well as close follow-up with her primary care provider for better management of her diabetes.  She may benefit from insulin as metformin is likely not adequately controlled her current diabetic status.  Also encourage patient to avoid sodas.  11:22 PM Through close monitoring and administration of insulin and IV fluid, CBG has steadily improved.  At this time patient is stable for discharge.  Patient To follow-up closely with PCP for further care.  Return precaution given.  Care discussed with Dr. Sedonia Small.    Domenic Moras, PA-C 01/28/21 2323    Hayden Rasmussen, MD 01/29/21 [phone]

## 2021-01-29 NOTE — Telephone Encounter (Signed)
ATC patient, no answer. Left a voicemail.

## 2021-02-03 NOTE — Telephone Encounter (Signed)
ATC patient, no answer.  

## 2021-02-10 ENCOUNTER — Other Ambulatory Visit (HOSPITAL_COMMUNITY): Payer: Self-pay

## 2021-02-10 MED ORDER — CYCLOBENZAPRINE HCL 5 MG PO TABS
5.0000 mg | ORAL_TABLET | Freq: Every evening | ORAL | 0 refills | Status: AC | PRN
  Filled 2021-02-10: qty 60, 30d supply, fill #0

## 2021-02-18 ENCOUNTER — Other Ambulatory Visit (HOSPITAL_COMMUNITY): Payer: Self-pay

## 2021-02-23 ENCOUNTER — Other Ambulatory Visit: Payer: Self-pay

## 2021-02-23 DIAGNOSIS — I10 Essential (primary) hypertension: Secondary | ICD-10-CM

## 2021-02-23 MED ORDER — AMLODIPINE BESYLATE 10 MG PO TABS: 1.0000 | ORAL_TABLET | Freq: Every day | ORAL | 1 refills | Status: DC

## 2021-03-01 ENCOUNTER — Other Ambulatory Visit: Payer: Self-pay | Admitting: Family Medicine

## 2021-03-02 ENCOUNTER — Other Ambulatory Visit: Payer: Self-pay | Admitting: Family Medicine

## 2021-03-02 ENCOUNTER — Telehealth: Payer: Self-pay

## 2021-03-02 MED ORDER — VENLAFAXINE HCL ER 75 MG PO CP24: ORAL_CAPSULE | ORAL | 2 refills | Status: DC

## 2021-03-02 NOTE — Telephone Encounter (Signed)
Patient requested refills of previous Rx, but was changed. Refill sent for correct Rx to requested pharmacy.

## 2021-03-02 NOTE — Telephone Encounter (Signed)
Pt is calling in requesting capsules for Rx venlafaxine XR (EFFEXOR-XR)  75 MG and pt state that she is out.  [facility] [address]

## 2021-03-06 NOTE — Telephone Encounter (Signed)
Lenna Sciara is calling from [facility] and wanted to let the provider know that they needed PA for Venlafaxine and for the provider to call [phone] to give additional information regarding dosage change, please advise. [phone]

## 2021-03-16 ENCOUNTER — Telehealth: Payer: Self-pay | Admitting: Family Medicine

## 2021-03-16 NOTE — Telephone Encounter (Signed)
Rachel Fields is calling and wanted to let the provider know that venlafaxine XR (EFFEXOR-XR) 75 MG 24 hr capsule was rejected by the pharmacy because the quantity limit and medication needs a PA. Provider can call [phone] to start PA. CB is [phone]

## 2021-03-19 ENCOUNTER — Other Ambulatory Visit: Payer: Self-pay | Admitting: Family Medicine

## 2021-03-19 DIAGNOSIS — I10 Essential (primary) hypertension: Secondary | ICD-10-CM

## 2021-03-22 ENCOUNTER — Other Ambulatory Visit: Payer: Self-pay | Admitting: Family Medicine

## 2021-03-22 DIAGNOSIS — N3281 Overactive bladder: Secondary | ICD-10-CM

## 2021-03-24 NOTE — Telephone Encounter (Signed)
Called [phone], and spoke with Lisabeth Pick to start a PA for the venlafaxine XR 75 MG 24 hr capsule, she stated a PA is not needed. While on the phone with Lisabeth Pick she did a few test claims, and all showed to be paid claims, and PA is not needed. [facility], she then stated she will try it with a different NDC, and fax paperwork to be completed and returned to her.

## 2021-03-24 NOTE — Telephone Encounter (Signed)
Called [phone], and spoke with Lisabeth Pick to start a PA for the venlafaxine XR 75 MG 24 hr capsule, she stated a PA is not needed. While on the phone with Lisabeth Pick she did a few test claims, and all showed to be paid claims, and PA is not needed. [facility], she then stated she will try it with a different NDC, and fax paperwork to be completed and returned to her.

## 2021-04-05 ENCOUNTER — Other Ambulatory Visit: Payer: Self-pay | Admitting: Family Medicine

## 2021-04-05 DIAGNOSIS — I10 Essential (primary) hypertension: Secondary | ICD-10-CM

## 2021-05-04 ENCOUNTER — Telehealth: Payer: Self-pay | Admitting: Family Medicine

## 2021-05-04 MED ORDER — VENLAFAXINE HCL ER 75 MG PO CP24: ORAL_CAPSULE | ORAL | 2 refills | Status: DC

## 2021-05-04 NOTE — Telephone Encounter (Signed)
Rx sent in as requested. 

## 2021-05-04 NOTE — Telephone Encounter (Signed)
See other telephone encounter, patient's insurance has changed.

## 2021-05-04 NOTE — Telephone Encounter (Signed)
Pt is calling in needing a refill on Rx venlafaxine XR (EFFEXOR-XR) 75- MG capsule  [facility] [address]  Pt stated please to make sure she gets capsules and has new insurance [employer] that started 05/03/2021 (Sunday).

## 2021-05-06 ENCOUNTER — Other Ambulatory Visit: Payer: Self-pay

## 2021-05-07 ENCOUNTER — Ambulatory Visit (INDEPENDENT_AMBULATORY_CARE_PROVIDER_SITE_OTHER): Payer: No Typology Code available for payment source | Admitting: Family Medicine

## 2021-05-07 ENCOUNTER — Encounter: Payer: Self-pay | Admitting: Family Medicine

## 2021-05-07 VITALS — BP 130/84 | HR 72 | Temp 98.0°F | Ht 67.0 in | Wt 189.8 lb

## 2021-05-07 DIAGNOSIS — I10 Essential (primary) hypertension: Secondary | ICD-10-CM

## 2021-05-07 DIAGNOSIS — L84 Corns and callosities: Secondary | ICD-10-CM

## 2021-05-07 DIAGNOSIS — E1165 Type 2 diabetes mellitus with hyperglycemia: Secondary | ICD-10-CM

## 2021-05-07 DIAGNOSIS — F419 Anxiety disorder, unspecified: Secondary | ICD-10-CM

## 2021-05-07 DIAGNOSIS — Z780 Asymptomatic menopausal state: Secondary | ICD-10-CM

## 2021-05-07 DIAGNOSIS — E785 Hyperlipidemia, unspecified: Secondary | ICD-10-CM | POA: Diagnosis not present

## 2021-05-07 DIAGNOSIS — Z1211 Encounter for screening for malignant neoplasm of colon: Secondary | ICD-10-CM | POA: Diagnosis not present

## 2021-05-07 DIAGNOSIS — E1169 Type 2 diabetes mellitus with other specified complication: Secondary | ICD-10-CM

## 2021-05-07 DIAGNOSIS — Z23 Encounter for immunization: Secondary | ICD-10-CM | POA: Diagnosis not present

## 2021-05-07 DIAGNOSIS — J302 Other seasonal allergic rhinitis: Secondary | ICD-10-CM

## 2021-05-07 DIAGNOSIS — L600 Ingrowing nail: Secondary | ICD-10-CM

## 2021-05-07 DIAGNOSIS — Z0000 Encounter for general adult medical examination without abnormal findings: Secondary | ICD-10-CM | POA: Diagnosis not present

## 2021-05-07 DIAGNOSIS — R42 Dizziness and giddiness: Secondary | ICD-10-CM

## 2021-05-07 DIAGNOSIS — F320 Major depressive disorder, single episode, mild: Secondary | ICD-10-CM

## 2021-05-07 LAB — BASIC METABOLIC PANEL
BUN: 10 mg/dL (ref 6–23)
CO2: 28 mEq/L (ref 19–32)
Calcium: 9.1 mg/dL (ref 8.4–10.5)
Chloride: 103 mEq/L (ref 96–112)
Creatinine, Ser: 0.77 mg/dL (ref 0.40–1.20)
GFR: 81.18 mL/min (ref >60.00)
Glucose, Bld: 183 mg/dL — ABNORMAL HIGH (ref 70–99)
Potassium: 3.9 mEq/L (ref 3.5–5.1)
Sodium: 141 mEq/L (ref 135–145)

## 2021-05-07 LAB — CBC WITH DIFFERENTIAL/PLATELET
Basophils Absolute: 0 10*3/uL (ref 0.0–0.1)
Basophils Relative: 0.6 % (ref 0.0–3.0)
Eosinophils Absolute: 0.2 10*3/uL (ref 0.0–0.7)
Eosinophils Relative: 4 % (ref 0.0–5.0)
HCT: 34.3 % — ABNORMAL LOW (ref 36.0–46.0)
Hemoglobin: 11.3 g/dL — ABNORMAL LOW (ref 12.0–15.0)
Lymphocytes Relative: 29.4 % (ref 12.0–46.0)
Lymphs Abs: 1.6 10*3/uL (ref 0.7–4.0)
MCHC: 32.9 g/dL (ref 30.0–36.0)
MCV: 78 fl (ref 78.0–100.0)
Monocytes Absolute: 0.5 10*3/uL (ref 0.1–1.0)
Monocytes Relative: 8.2 % (ref 3.0–12.0)
Neutro Abs: 3.2 10*3/uL (ref 1.4–7.7)
Neutrophils Relative %: 57.8 % (ref 43.0–77.0)
Platelets: 238 10*3/uL (ref 150.0–400.0)
RBC: 4.4 Mil/uL (ref 3.87–5.11)
RDW: 14.8 % (ref 11.5–15.5)
WBC: 5.5 10*3/uL (ref 4.0–10.5)

## 2021-05-07 LAB — LIPID PANEL
Cholesterol: 147 mg/dL (ref 0–200)
HDL: 67.7 mg/dL (ref >39.00)
LDL Cholesterol: 70 mg/dL (ref 0–99)
NonHDL: 79.79
Total CHOL/HDL Ratio: 2
Triglycerides: 49 mg/dL (ref 0.0–149.0)
VLDL: 9.8 mg/dL (ref 0.0–40.0)

## 2021-05-07 LAB — HEMOGLOBIN A1C: Hgb A1c MFr Bld: 10.4 % — ABNORMAL HIGH (ref 4.6–6.5)

## 2021-05-07 LAB — T4, FREE: Free T4: 0.84 ng/dL (ref 0.60–1.60)

## 2021-05-07 LAB — TSH: TSH: 1.36 u[IU]/mL (ref 0.35–5.50)

## 2021-05-07 MED ORDER — FREESTYLE LIBRE 2 SENSOR MISC: 11 refills | Status: DC

## 2021-05-07 MED ORDER — FLUTICASONE PROPIONATE 50 MCG/ACT NA SUSP: 1.0000 | Freq: Every day | NASAL | 3 refills | Status: AC

## 2021-05-07 MED ORDER — BLOOD GLUCOSE MONITOR KIT: PACK | 0 refills | Status: DC

## 2021-05-07 MED ORDER — METFORMIN HCL 500 MG PO TABS: 500.0000 mg | ORAL_TABLET | Freq: Two times a day (BID) | ORAL | 3 refills | Status: DC

## 2021-05-07 NOTE — Progress Notes (Signed)
Subjective:     Rachel Fields is a 65 y.o. female and is here for a comprehensive physical exam. The patient reports problems - dizziness x 1 month when leaning to R side and laying back.  Increasing p.o. intake of water.  Stopped drinking sodas after ED visit for hyperglycemia.  Blood sugar was 663 due to drinking [facility].  Checks bs at work.  Taking metformin 500 mg daily.  States was in denial about DM diagnosis.  Patient joined the gym and has lost weight.  No longer snoring as much.  Patient interested in having colonoscopy done and making appointment for podiatrist.  Patient with ingrown toenail rubbing against adjacent toe causing callus.  Wearing shoes with plenty of space and toebox.  BP stable on current meds.  Anxiety and depression stable on venlafaxine XR  Social History   Socioeconomic History   Marital status: Married    Spouse name: Not on file   Number of children: Not on file   Years of education: Not on file   Highest education level: Not on file  Occupational History   Not on file  Tobacco Use   Smoking status: Never   Smokeless tobacco: Never  Vaping Use   Vaping Use: Never used  Substance and Sexual Activity   Alcohol use: Yes    Comment: socially - rare; 2 drinks a few times per month   Drug use: No   Sexual activity: Not on file  Other Topics Concern   Not on file  Social History Narrative   Work or School: Quarry manager, working on [employer]      Home Situation: Lives with husband - married in 2015; hx of homelessness in 2015      Spiritual Beliefs: Jehovah's Witness      Lifestyle: no regular CV exercise; diet improving - reports she has been able to loose weight                  Social Determinants of Health   Financial Resource Strain: Not on file  Food Insecurity: Not on file  Transportation Needs: Not on file  Physical Activity: Not on file  Stress: Not on file  Social Connections: Not on file  Intimate Partner Violence: Not on file   Health  Maintenance  Topic Date Due   OPHTHALMOLOGY EXAM  Never done   COLONOSCOPY (Pts 45-31yr Insurance coverage will need to be confirmed)  Never done   Zoster Vaccines- Shingrix (1 of 2) Never done   MAMMOGRAM  04/22/2018   PAP SMEAR-Modifier  02/26/2019   TETANUS/TDAP  10/12/2019   COVID-19 Vaccine (3 - Booster for Moderna series) 05/20/2020   HEMOGLOBIN A1C  03/19/2021   DEXA SCAN  Never done   PNA vac Low Risk Adult (1 of 2 - PCV13) 05/01/2021   INFLUENZA VACCINE  05/11/2021   FOOT EXAM  09/18/2021   HIV Screening  Completed   Hepatitis C Screening  Addressed   HPV VACCINES  Aged Out    The following portions of the patient's history were reviewed and updated as appropriate: allergies, current medications, past family history, past medical history, past social history, past surgical history, and problem list.  Review of Systems Pertinent items noted in HPI and remainder of comprehensive ROS otherwise negative.   Objective:    BP 130/84 (BP Location: Right Arm, Patient Position: Sitting, Cuff Size: Normal)   Pulse 72   Temp 98 F (36.7 C) (Oral)   Ht '5\' 7"'$  (  1.702 m)   Wt 189 lb 12.8 oz (86.1 kg)   SpO2 93%   BMI 29.73 kg/m  General appearance: alert, cooperative, and no distress Head: Normocephalic, without obvious abnormality, atraumatic Eyes: conjunctivae/corneas clear. PERRL, EOM's intact. Fundi benign. Ears: normal L TM and external ear canals both ears.  R TM full, dull in color without erythema or suppurative fluid. Nose: Nares normal. Septum midline. Mucosa normal. No drainage or sinus tenderness. Throat: lips, mucosa, and tongue normal; teeth and gums normal Neck: no adenopathy, no carotid bruit, no JVD, supple, symmetrical, trachea midline, and thyroid not enlarged, symmetric, no tenderness/mass/nodules Lungs: clear to auscultation bilaterally Heart: regular rate and rhythm, S1, S2 normal, no murmur, click, rub or gallop Abdomen: soft, non-tender; bowel sounds  normal; no masses,  no organomegaly Extremities: extremities normal, atraumatic, no cyanosis or edema Pulses: 2+ and symmetric Skin: Skin color, texture, turgor normal. No rashes or lesions fourth left toe with ingrown nail on medial edge rubbing against third left toe on lateral edge causing callus.  No erythema or drainage noted.  Corn on second left toe. Lymph nodes: Cervical, supraclavicular, and axillary nodes normal. Neurologic: Alert and oriented X 3, normal strength and tone. Normal symmetric reflexes. Normal coordination and gait    Assessment:    Healthy female exam.      Plan:    Anticipatory guidance given including wearing seatbelts, smoke detectors in the home, increasing physical activity, increasing p.o. intake of water and vegetables. -obtain labs -pt to schedule mammogram. Given info -discussed immunizations -colonoscopy due -given handout -next CPE in 1 yr See After Visit Summary for Counseling Recommendations   Essential hypertension -Elevated -Continue lifestyle modifications -Continue current medications including Norvasc - Plan: CBC with Differential/Platelet, Basic metabolic panel  Hyperlipidemia associated with type 2 diabetes mellitus ([place])  -Lifestyle modifications - Plan: TSH, T4, Free, Lipid panel  Colon cancer screening  - Plan: Ambulatory referral to Gastroenterology  Seasonal allergies -Likely contributing to fullness of right TM and vertigo symptoms -Restart regular OTC allergy medication and/or Flonase - Plan: fluticasone (FLONASE) 50 MCG/ACT nasal spray, CBC with Differential/Platelet  Need for pneumococcal vaccination -Prevnar 20 given this visit  Ingrown nail of fourth toe of left foot  -Discussed supportive care including Epson salt soaks and ways to relieve pressure on toenail - Plan: Ambulatory referral to Podiatry  Callus of foot  - Plan: Ambulatory referral to Podiatry, TSH, T4, Free  Uncontrolled type 2 diabetes mellitus  with hyperglycemia ([place])  -Congratulated on recent lifestyle modifications -Increase metformin to 500 mg twice daily.  We will likely need to further increase medication. -Continue regular eye exam - Plan: Ambulatory referral to Podiatry, Hemoglobin A1c  Asymptomatic menopausal state  - Plan: DG Bone Density  Vertigo -Likely 2/2 increased pressure in right ear from allergies -Start allergy regimen -For continued or worsening symptoms follow-up -Given information  Anxiety -Stable on current medication -GAD-7 score 7 -Continue venlafaxine XR 225 mg -Continue to monitor  Depression, major, single episode, mild (H[facility]) -Stable on current medication -PHQ-9 score 8 -Continue venlafaxine XR 225 mg -Continue to monitor  F/u as needed for continued or worsened vertigo symptoms, otherwise f/u in 1-2 months  Grier Mitts, MD

## 2021-05-11 ENCOUNTER — Other Ambulatory Visit: Payer: Self-pay

## 2021-05-11 DIAGNOSIS — N3281 Overactive bladder: Secondary | ICD-10-CM

## 2021-05-11 MED ORDER — TOLTERODINE TARTRATE ER 4 MG PO CP24: 4.0000 mg | ORAL_CAPSULE | Freq: Every day | ORAL | 0 refills | Status: DC

## 2021-05-12 ENCOUNTER — Other Ambulatory Visit: Payer: Self-pay

## 2021-05-12 DIAGNOSIS — I10 Essential (primary) hypertension: Secondary | ICD-10-CM

## 2021-05-12 DIAGNOSIS — E782 Mixed hyperlipidemia: Secondary | ICD-10-CM

## 2021-05-12 MED ORDER — ROSUVASTATIN CALCIUM 10 MG PO TABS
10.0000 mg | ORAL_TABLET | Freq: Every day | ORAL | 3 refills | Status: DC
Start: 2021-05-12 — End: 2021-11-09

## 2021-05-12 MED ORDER — AMLODIPINE BESYLATE 10 MG PO TABS: 10.0000 mg | ORAL_TABLET | Freq: Every day | ORAL | 3 refills | Status: DC

## 2021-05-12 MED ORDER — VENLAFAXINE HCL ER 75 MG PO CP24: ORAL_CAPSULE | ORAL | 2 refills | Status: DC

## 2021-06-16 ENCOUNTER — Telehealth (INDEPENDENT_AMBULATORY_CARE_PROVIDER_SITE_OTHER): Payer: No Typology Code available for payment source | Admitting: Family Medicine

## 2021-06-16 ENCOUNTER — Encounter: Payer: Self-pay | Admitting: Family Medicine

## 2021-06-16 DIAGNOSIS — U071 COVID-19: Secondary | ICD-10-CM

## 2021-06-16 MED ORDER — NIRMATRELVIR/RITONAVIR (PAXLOVID)TABLET: 3.0000 | ORAL_TABLET | Freq: Two times a day (BID) | ORAL | 0 refills | Status: AC

## 2021-06-16 MED ORDER — BENZONATATE 100 MG PO CAPS: 100.0000 mg | ORAL_CAPSULE | Freq: Three times a day (TID) | ORAL | 0 refills | Status: AC | PRN

## 2021-06-16 NOTE — Progress Notes (Signed)
Virtual Visit via Video Note  I connected with Von  on 06/16/21 at  6:20 PM EDT by a video enabled telemedicine application and verified that I am speaking with the correct person using two identifiers.  Location patient: home, [place] Location provider:work or home office Persons participating in the virtual visit: patient, provider  I discussed the limitations of evaluation and management by telemedicine and the availability of in person appointments. The patient expressed understanding and agreed to proceed.   HPI:  Acute telemedicine visit for Covid19: -Onset: 2-3 days ago; had positive test at work -Symptoms include: nasal congestion, cough, sore throat, feels tired -Denies:fevers, CP, SOB, NVD, inability to eat/drink/get out of bed -Has tried:lemon tea, honey -Pertinent past medical history: see below -Pertinent medication allergies: No Known Allergies -COVID-19 vaccine status: 2 doses, no boosters -GFR was 81 in July  ROS: See pertinent positives and negatives per HPI.  Past Medical History:  Diagnosis Date   Allergy    Anxiety    Anxiety and depression    Arthritis    Depression    HTN (hypertension)    Hx of iron deficiency anemia    reports [place] trait; reports on iron her whole life   Hypertension    Low back pain    chronic, reports hx DDD treated by chiropractor   No blood products    Jehovah's Witness   OAB (overactive bladder)    Seasonal allergies    Sickle cell trait ([place])    Uterine fibroid     Past Surgical History:  Procedure Laterality Date   BUNIONECTOMY Bilateral 1987   CESAREAN SECTION     Teeth extracted   01/14/2017   TUBAL LIGATION     TUMOR REMOVAL     behind left eye     Current Outpatient Medications:    amLODipine (NORVASC) 10 MG tablet, Take 1 tablet (10 mg total) by mouth daily., Disp: 90 tablet, Rfl: 3   benzonatate (TESSALON PERLES) 100 MG capsule, Take 1 capsule (100 mg total) by mouth 3 (three) times daily as needed., Disp: 20  capsule, Rfl: 0   blood glucose meter kit and supplies KIT, Dispense based on patient and insurance preference. Use up to four times daily as directed., Disp: 1 each, Rfl: 0   calcium-vitamin D (CALCIUM + D) 250-125 MG-UNIT per tablet, Take 1 tablet by mouth daily., Disp: 30 tablet, Rfl: 3   Continuous Blood Gluc Sensor (FREESTYLE LIBRE 2 SENSOR) MISC, Apply 1 sensor to skin every 2 weeks as directed for glucose monitoring., Disp: 2 each, Rfl: 11   cyclobenzaprine (FLEXERIL) 5 MG tablet, Take 1-2 tablets (5-10 mg total) by mouth 2 (two) times daily as needed for muscle spasms., Disp: 24 tablet, Rfl: 0   cyclobenzaprine (FLEXERIL) 5 MG tablet, Take 1-2 tablets (5-10 mg total) by mouth at bedtime as needed., Disp: 60 tablet, Rfl: 0   diclofenac sodium (VOLTAREN) 1 % GEL, Apply 2 g topically 4 (four) times daily., Disp: 1 Tube, Rfl: 5   Fe Fum-FA-B Cmp-C-Zn-Mg-Mn-Cu (HEMOCYTE PLUS) 106-1 MG CAPS, Take 1 capsule by mouth daily., Disp: 30 each, Rfl: 3   fluconazole (DIFLUCAN) 150 MG tablet, Take one tab now.  Repeat dose in 3 days for continued symptoms. (Patient taking differently: Take one tab now.  Repeat dose in 3 days for continued symptoms.), Disp: 2 tablet, Rfl: 0   fluticasone (FLONASE) 50 MCG/ACT nasal spray, Place 1 spray into both nostrils daily., Disp: 16 g, Rfl: 3  irbesartan (AVAPRO) 150 MG tablet, TAKE 1 TABLET BY MOUTH EVERY DAY, Disp: 90 tablet, Rfl: 0   meloxicam (MOBIC) 15 MG tablet, Take 1 tablet by mouth once a day with food for inflammation. (Start afer completion of medrol dose pack), Disp: 60 tablet, Rfl: 0   meloxicam (MOBIC) 7.5 MG tablet, Take 1 tablet (7.5 mg total) by mouth daily., Disp: 30 tablet, Rfl: 0   metaxalone (SKELAXIN) 800 MG tablet, TAKE 1 TABLET BY MOUTH THREE TIMES A DAY, Disp: 20 tablet, Rfl: 0   metFORMIN (GLUCOPHAGE) 500 MG tablet, Take 1 tablet (500 mg total) by mouth 2 (two) times daily with a meal., Disp: 180 tablet, Rfl: 3   methocarbamol (ROBAXIN) 500 MG  tablet, Take 1 tablet by mouth every six hours as needed for muscle tension/spasms (daytime use), Disp: 60 tablet, Rfl: 1   naproxen (NAPROSYN) 500 MG tablet, Take 1 tablet (500 mg total) by mouth 2 (two) times daily as needed for moderate pain., Disp: 30 tablet, Rfl: 0   nirmatrelvir/ritonavir EUA (PAXLOVID) 20 x 150 MG & 10 x 100MG TABS, Take 3 tablets by mouth 2 (two) times daily for 5 days. (Take nirmatrelvir 150 mg two tablets twice daily for 5 days and ritonavir 100 mg one tablet twice daily for 5 days) Patient GFR is > 60, Disp: 30 tablet, Rfl: 0   OVER THE COUNTER MEDICATION, Take 2 tablets by mouth daily. Biotin 5030mg, Disp: , Rfl:    rosuvastatin (CRESTOR) 10 MG tablet, Take 1 tablet (10 mg total) by mouth daily., Disp: 90 tablet, Rfl: 3   tolterodine (DETROL LA) 4 MG 24 hr capsule, Take 1 capsule (4 mg total) by mouth daily., Disp: 90 capsule, Rfl: 0   venlafaxine XR (EFFEXOR-XR) 75 MG 24 hr capsule, TAKE 3 CAPSULES EVERY MORNING WITH BREAKFAST, Disp: 270 capsule, Rfl: 2  EXAM:  VITALS per patient if applicable:  GENERAL: alert, oriented, appears well and in no acute distress  HEENT: atraumatic, conjunttiva clear, no obvious abnormalities on inspection of external nose and ears  NECK: normal movements of the head and neck  LUNGS: on inspection no signs of respiratory distress, breathing rate appears normal, no obvious gross SOB, gasping or wheezing  CV: no obvious cyanosis  MS: moves all visible extremities without noticeable abnormality  PSYCH/NEURO: pleasant and cooperative, no obvious depression or anxiety, speech and thought processing grossly intact  ASSESSMENT AND PLAN:  Discussed the following assessment and plan:  COVID-19   Discussed treatment options (infusions and oral options and risk of drug interactions), ideal treatment window, potential complications, isolation and precautions for COVID-19.  Discussed possibility of rebound with antivirals and the need to  reisolate if it should occur for 5 days. Checked for/reviewed any labs done in the last 90 days with GFR listed in HPI if available.  After lengthy discussion, the patient opted for treatment with Paxlovid due to being higher risk for complications of covid or severe disease and other factors. Discussed EUA status of this drug and the fact that there is preliminary limited knowledge of risks/interactions/side effects per EUA document vs possible benefits and precautions. She is aware of potential interactions after lengthy discussion.  She has opted to take one half dose of her Norvasc during treatment and hold her cholesterol medicine and detrol until 3 days after finishing her treatment course.  She reports she is not taking any of the muscle relaxers that are listed on her medication list.  This information was shared with patient  during the visit and also was provided in patient instructions. Also, advised that patient discuss risks/interactions and use with pharmacist/treatment team as well. The patient did want a prescription for cough, Tessalon Rx sent.  Other symptomatic care measures summarized in patient instructions. Work/School slipped offered: provided in patient instructions    I discussed the assessment and treatment plan with the patient. The patient was provided an opportunity to ask questions and all were answered. The patient agreed with the plan and demonstrated an understanding of the instructions.     Rachel Kern, DO

## 2021-06-16 NOTE — Patient Instructions (Addendum)
---------------------------------------------------------------------------------------------------------------------------    WORK SLIP:  Patient Rachel Fields,  [birth year], was seen for a medical visit today, 06/16/21 . Please excuse from work for a COVID like illness. We advise 10 days minimum from the onset of symptoms (06/13/21) PLUS 1 day of no fever and improved symptoms. Will defer to employer for a sooner return to work if symptoms have resolved, it is greater than 5 days since the positive test and the patient can wear a high-quality, tight fitting mask such as N95 or KN95 at all times for an additional 5 days. Would also suggest COVID19 antigen testing is negative prior to return.  Sincerely: E-signature: Dr. Colin Benton, DO [facility] Ph: [phone]   ------------------------------------------------------------------------------------------------------------------------------   HOME CARE TIPS:   -I sent the medication(s) we discussed to your pharmacy: Meds ordered this encounter  Medications   nirmatrelvir/ritonavir EUA (PAXLOVID) 20 x 150 MG & 10 x 100MG TABS    Sig: Take 3 tablets by mouth 2 (two) times daily for 5 days. (Take nirmatrelvir 150 mg two tablets twice daily for 5 days and ritonavir 100 mg one tablet twice daily for 5 days) Patient GFR is > 60    Dispense:  30 tablet    Refill:  0   benzonatate (TESSALON PERLES) 100 MG capsule    Sig: Take 1 capsule (100 mg total) by mouth 3 (three) times daily as needed.    Dispense:  20 capsule    Refill:  0     -I sent in the [facility] treatment or referral you requested per our discussion. Please see the information provided below and discuss further with the pharmacist/treatment team.  -If taking Paxlovid, please review all medications, supplement and over the counter drugs with your pharmacist and ask them to check for any interactions. Please make the following changes to your regular  medications while taking Paxlovid: -Take one half dose (52m daily) of your Amlodipine (norvasc) during the treatment -stop your cholesterol medication (Crestor/rosuvastatin) and restart 3 days after you finish treatment -stop your detrol (tolterodine) while taking the paxlovid and restart 3 days after treatment  -If taking Paxlovid, there is a chance of rebound illness after finishing your treatment. If you become sick again please isolate for an additional 5 days.    -can use tylenol if needed for fevers, aches and pains per instructions  -can use nasal saline a few times per day if you have nasal congestion  -stay hydrated, drink plenty of fluids and eat small healthy meals - avoid dairy   -follow up with your doctor in 2-3 days unless improving and feeling better  -stay home while sick, except to seek medical care. If you have COVID19, ideally it would be best to stay home for a full 10 days since the onset of symptoms PLUS one day of no fever and feeling better. Wear a good mask that fits snugly (such as N95 or KN95) if around others to reduce the risk of transmission.  It was nice to meet you today, and I really hope you are feeling better soon. I help [facility] out with telemedicine visits on Tuesdays and Thursdays and am available for visits on those days. If you have any concerns or questions following this visit please schedule a follow up visit with your Primary Care doctor or seek care at a local urgent care clinic to avoid delays in care.    Seek in person care or schedule a follow up video  visit promptly if your symptoms worsen, new concerns arise or you are not improving with treatment. Call 911 and/or seek emergency care if your symptoms are severe or life threatening.  FACT SHEET FOR PATIENTS, PARENTS, AND CAREGIVERS EMERGENCY USE AUTHORIZATION (EUA) OF PAXLOVID FOR CORONAVIRUS DISEASE 2019 (COVID-19) You are being given this Fact Sheet because your healthcare provider  believes it is necessary to provide you with PAXLOVID for the treatment of mild-to-moderate coronavirus disease (COVID-19) caused by the SARS-CoV-2 virus. This Fact Sheet contains information to help you understand the risks and benefits of taking the PAXLOVID you have received or may receive. The U.S. Food and Drug Administration (FDA) has issued an Emergency Use Authorization (EUA) to make PAXLOVID available during the COVID-19 pandemic (for more details about an EUA please see "What is an Emergency Use Authorization?" at the end of this document). PAXLOVID is not an FDA-approved medicine in the [place]. Read this Fact Sheet for information about PAXLOVID. Talk to your healthcare provider about your options or if you have any questions. It is your choice to take PAXLOVID.  What is COVID-19? COVID-19 is caused by a virus called a coronavirus. You can get COVID-19 through close contact with another person who has the virus. COVID-19 illnesses have ranged from very mild-to-severe, including illness resulting in death. While information so far suggests that most COVID-19 illness is mild, serious illness can happen and may cause some of your other medical conditions to become worse. Older people and people of all ages with severe, long lasting (chronic) medical conditions like heart disease, lung disease, and diabetes, for example seem to be at higher risk of being hospitalized for COVID-19.  What is PAXLOVID? PAXLOVID is an investigational medicine used to treat mild-to-moderate COVID-19 in adults and children [41 years of age and older weighing at least 58 pounds (85 kg)] with positive results of direct SARS-CoV-2 viral testing, and who are at high risk for progression to severe COVID-19, including hospitalization or death. PAXLOVID is investigational because it is still being studied. There is limited information about the safety and effectiveness of using PAXLOVID to treat people  with mild-to-moderate COVID-19.  The FDA has authorized the emergency use of PAXLOVID for the treatment of mild-tomoderate COVID-19 in adults and children [23 years of age and older weighing at least 65 pounds (33 kg)] with a positive test for the virus that causes COVID-19, and who are at high risk for progression to severe COVID-19, including hospitalization or death, under an EUA. 1 Revised: 26 December 2020   What should I tell my healthcare provider before I take PAXLOVID? Tell your healthcare provider if you: ? Have any allergies ? Have liver or kidney disease ? Are pregnant or plan to become pregnant ? Are breastfeeding a child ? Have any serious illnesses  Tell your healthcare provider about all the medicines you take, including prescription and over-the-counter medicines, vitamins, and herbal supplements. Some medicines may interact with PAXLOVID and may cause serious side effects. Keep a list of your medicines to show your healthcare provider and pharmacist when you get a new medicine.  You can ask your healthcare provider or pharmacist for a list of medicines that interact with PAXLOVID. Do not start taking a new medicine without telling your healthcare provider. Your healthcare provider can tell you if it is safe to take PAXLOVID with other medicines.  Tell your healthcare provider if you are taking combined hormonal contraceptive. PAXLOVID may affect how your birth control pills work.  Females who are able to become pregnant should use another effective alternative form of contraception or an additional barrier method of contraception. Talk to your healthcare provider if you have any questions about contraceptive methods that might be right for you.  How do I take PAXLOVID? ? PAXLOVID consists of 2 medicines: nirmatrelvir and ritonavir. o Take 2 pink tablets of nirmatrelvir with 1 white tablet of ritonavir by mouth 2 times each day (in the morning and in the evening) for  5 days. For each dose, take all 3 tablets at the same time. o If you have kidney disease, talk to your healthcare provider. You may need a different dose. ? Swallow the tablets whole. Do not chew, break, or crush the tablets. ? Take PAXLOVID with or without food. ? Do not stop taking PAXLOVID without talking to your healthcare provider, even if you feel better. ? If you miss a dose of PAXLOVID within 8 hours of the time it is usually taken, take it as soon as you remember. If you miss a dose by more than 8 hours, skip the missed dose and take the next dose at your regular time. Do not take 2 doses of PAXLOVID at the same time. ? If you take too much PAXLOVID, call your healthcare provider or go to the nearest hospital emergency room right away. ? If you are taking a ritonavir- or cobicistat-containing medicine to treat hepatitis C or Human Immunodeficiency Virus (HIV), you should continue to take your medicine as prescribed by your healthcare provider. 2 Revised: 26 December 2020    Talk to your healthcare provider if you do not feel better or if you feel worse after 5 days.  Who should generally not take PAXLOVID? Do not take PAXLOVID if: ? You are allergic to nirmatrelvir, ritonavir, or any of the ingredients in PAXLOVID. ? You are taking any of the following medicines: o Alfuzosin o Pethidine, propoxyphene o Ranolazine o Amiodarone, dronedarone, flecainide, propafenone, quinidine o Colchicine o Lurasidone, pimozide, clozapine o Dihydroergotamine, ergotamine, methylergonovine o Lovastatin, simvastatin o Sildenafil (Revatio) for pulmonary arterial hypertension (PAH) o Triazolam, oral midazolam o Apalutamide o Carbamazepine, phenobarbital, phenytoin o Rifampin o St. John's Wort (hypericum perforatum) Taking PAXLOVID with these medicines may cause serious or life-threatening side effects or affect how PAXLOVID works.  These are not the only medicines that may cause serious  side effects if taken with PAXLOVID. PAXLOVID may increase or decrease the levels of multiple other medicines. It is very important to tell your healthcare provider about all of the medicines you are taking because additional laboratory tests or changes in the dose of your other medicines may be necessary while you are taking PAXLOVID. Your healthcare provider may also tell you about specific symptoms to watch out for that may indicate that you need to stop or decrease the dose of some of your other medicines.  What are the important possible side effects of PAXLOVID? Possible side effects of PAXLOVID are: ? Allergic Reactions. Allergic reactions can happen in people taking PAXLOVID, even after only 1 dose. Stop taking PAXLOVID and call your healthcare provider right away if you get any of the following symptoms of an allergic reaction: o hives o trouble swallowing or breathing o swelling of the mouth, lips, or face o throat tightness o hoarseness 3 Revised: 26 December 2020  o skin rash ? Liver Problems. Tell your healthcare provider right away if you have any of these signs and symptoms of liver problems: loss of appetite,  yellowing of your skin and the whites of eyes (jaundice), dark-colored urine, pale colored stools and itchy skin, stomach area (abdominal) pain. ? Resistance to HIV Medicines. If you have untreated HIV infection, PAXLOVID may lead to some HIV medicines not working as well in the future. ? Other possible side effects include: o altered sense of taste o diarrhea o high blood pressure o muscle aches These are not all the possible side effects of PAXLOVID. Not many people have taken PAXLOVID. Serious and unexpected side effects may happen. PAXLOVID is still being studied, so it is possible that all of the risks are not known at this time.  What other treatment choices are there? Veklury (remdesivir) is FDA-approved for the treatment of mild-to-moderate HENID-78 in  certain adults and children. Talk with your doctor to see if Marijean Heath is appropriate for you. Like PAXLOVID, FDA may also allow for the emergency use of other medicines to treat people with COVID-19. Go to https://price.info/ for information on the emergency use of other medicines that are authorized by FDA to treat people with COVID-19. Your healthcare provider may talk with you about clinical trials for which you may be eligible. It is your choice to be treated or not to be treated with PAXLOVID. Should you decide not to receive it or for your child not to receive it, it will not change your standard medical care.  What if I am pregnant or breastfeeding? There is no experience treating pregnant women or breastfeeding mothers with PAXLOVID. For a mother and unborn baby, the benefit of taking PAXLOVID may be greater than the risk from the treatment. If you are pregnant, discuss your options and specific situation with your healthcare provider. It is recommended that you use effective barrier contraception or do not have sexual activity while taking PAXLOVID. If you are breastfeeding, discuss your options and specific situation with your healthcare provider. 4 Revised: 26 December 2020   How do I report side effects with PAXLOVID? Contact your healthcare provider if you have any side effects that bother you or do not go away. Report side effects to FDA MedWatch at SmoothHits.hu or call 1-800-FDA1088 or you can report side effects to [employer]. at the contact information provided below. Website Fax number Telephone number www.pfizersafetyreporting.com [phone] How should I store Lykens? Store PAXLOVID tablets at room temperature, between 68?F to 77?F (20?C to 25?C). How can I learn more about COVID-19? ? Ask your healthcare provider. ? Visit  https://jacobson-johnson.com/. ? Contact your local or state public health department. What is an Emergency Use Authorization (EUA)? The [place] FDA has made PAXLOVID available under an emergency access mechanism called an Emergency Use Authorization (EUA). The EUA is supported by a Education officer, museum and Human Service (HHS) declaration that circumstances exist to justify the emergency use of drugs and biological products during the COVID-19 pandemic. PAXLOVID for the treatment of mild-to-moderate COVID-19 in adults and children [54 years of age and older weighing at least 104 pounds (38 kg)] with positive results of direct SARS-CoV-2 viral testing, and who are at high risk for progression to severe COVID-19, including hospitalization or death, has not undergone the same type of review as an FDA-approved product. In issuing an EUA under the JKDTO-67 public health emergency, the FDA has determined, among other things, that based on the total amount of scientific evidence available including data from adequate and well-controlled clinical trials, if available, it is reasonable to believe that the product may be effective for diagnosing,  treating, or preventing COVID-19, or a serious or life-threatening disease or condition caused by COVID-19; that the known and potential benefits of the product, when used to diagnose, treat, or prevent such disease or condition, outweigh the known and potential risks of such product; and that there are no adequate, approved, and available alternatives. All of these criteria must be met to allow for the product to be used in the treatment of patients during the COVID-19 pandemic. The EUA for PAXLOVID is in effect for the duration of the COVID-19 declaration justifying emergency use of this product, unless terminated or revoked (after which the products may no longer be used under the EUA). 5 Revised: 26 December 2020     Additional Information For general  questions, visit the website or call the telephone number provided below. Website Telephone number www.COVID19oralRx.com ([phone] (1-877-C19-PACK) You can also go to www.pfizermedinfo.com or call [phone] for more information. [id number] Revised: 26 December 2020

## 2021-07-10 ENCOUNTER — Telehealth: Payer: Self-pay

## 2021-07-10 ENCOUNTER — Other Ambulatory Visit: Payer: Self-pay | Admitting: Family Medicine

## 2021-07-10 DIAGNOSIS — I10 Essential (primary) hypertension: Secondary | ICD-10-CM

## 2021-07-10 NOTE — Telephone Encounter (Signed)
Patient called requesting Rx refill on irbesartan (AVAPRO) 150 MG tablet Pt is requesting a call when Rx is sent pt stated it is ok to leave Vm because she may be sleep.

## 2021-07-10 NOTE — Telephone Encounter (Signed)
Refill sent, called pt to let her know, no answer and number on list has no vm set up. [phone]

## 2021-07-18 ENCOUNTER — Other Ambulatory Visit: Payer: Self-pay | Admitting: Family Medicine

## 2021-07-18 DIAGNOSIS — N3281 Overactive bladder: Secondary | ICD-10-CM

## 2021-08-03 ENCOUNTER — Telehealth: Payer: Self-pay | Admitting: Family Medicine

## 2021-08-03 DIAGNOSIS — E1165 Type 2 diabetes mellitus with hyperglycemia: Secondary | ICD-10-CM

## 2021-08-03 DIAGNOSIS — I10 Essential (primary) hypertension: Secondary | ICD-10-CM

## 2021-08-03 NOTE — Telephone Encounter (Signed)
Patient called stating that the pharmacy is waiting on refill requests from Dr. Volanda Napoleon. Patient is needing a refill of irbesartan (AVAPRO) 150 MG tablet and metFORMIN (GLUCOPHAGE) 500 MG tablet [facility] [place] [place].  Patient states that she is also still waiting on blood glucose meter kit and supplies KIT as well.  She says that the pharmacy told her to tell the office that no automatic refills should be sent.  Patient would like a call at [phone] once refills have been sent.  Please advise.

## 2021-08-04 MED ORDER — IRBESARTAN 150 MG PO TABS: 150.0000 mg | ORAL_TABLET | Freq: Every day | ORAL | 3 refills | Status: DC

## 2021-08-04 MED ORDER — METFORMIN HCL 500 MG PO TABS
500.0000 mg | ORAL_TABLET | Freq: Two times a day (BID) | ORAL | 3 refills | Status: DC
Start: 2021-08-04 — End: 2021-09-08

## 2021-08-04 MED ORDER — BLOOD GLUCOSE MONITOR KIT: PACK | 0 refills | Status: AC

## 2021-08-04 NOTE — Telephone Encounter (Signed)
Spoke with pt, informed refills have been sent to mail order, as well as the Rx for the Rx for the blood glucose and supplies kit as this is her new pharmacy 2/2 change in insurance.

## 2021-08-04 NOTE — Addendum Note (Signed)
Addended by: Anderson Malta on: 08/04/2021 11:30 AM   Modules accepted: Orders

## 2021-08-06 ENCOUNTER — Telehealth: Payer: Self-pay | Admitting: Family Medicine

## 2021-08-06 DIAGNOSIS — E1165 Type 2 diabetes mellitus with hyperglycemia: Secondary | ICD-10-CM

## 2021-08-06 NOTE — Telephone Encounter (Signed)
[facility] pharm tech with [facility] is calling freestyle libre 2 sensor is missing the directions, quantity and refills and reference number is [id number]

## 2021-08-20 MED ORDER — FREESTYLE LIBRE 2 SENSOR MISC: 11 refills | Status: DC

## 2021-08-20 NOTE — Telephone Encounter (Signed)
Directions sent in with electronic refill.

## 2021-09-07 ENCOUNTER — Encounter (HOSPITAL_COMMUNITY): Payer: Self-pay | Admitting: *Deleted

## 2021-09-07 ENCOUNTER — Telehealth: Payer: Self-pay

## 2021-09-07 ENCOUNTER — Emergency Department (HOSPITAL_COMMUNITY)
Admission: EM | Admit: 2021-09-07 | Discharge: 2021-09-08 | Disposition: A | Discharge status: Home / Self Care | Payer: PRIVATE HEALTH INSURANCE | Attending: Emergency Medicine | Admitting: Emergency Medicine

## 2021-09-07 ENCOUNTER — Emergency Department (HOSPITAL_COMMUNITY): Payer: PRIVATE HEALTH INSURANCE

## 2021-09-07 ENCOUNTER — Other Ambulatory Visit: Payer: Self-pay

## 2021-09-07 DIAGNOSIS — I10 Essential (primary) hypertension: Secondary | ICD-10-CM | POA: Diagnosis not present

## 2021-09-07 DIAGNOSIS — E119 Type 2 diabetes mellitus without complications: Secondary | ICD-10-CM | POA: Diagnosis not present

## 2021-09-07 DIAGNOSIS — R739 Hyperglycemia, unspecified: Secondary | ICD-10-CM

## 2021-09-07 DIAGNOSIS — Z79899 Other long term (current) drug therapy: Secondary | ICD-10-CM | POA: Diagnosis not present

## 2021-09-07 DIAGNOSIS — Z7984 Long term (current) use of oral hypoglycemic drugs: Secondary | ICD-10-CM | POA: Diagnosis not present

## 2021-09-07 DIAGNOSIS — R300 Dysuria: Secondary | ICD-10-CM | POA: Diagnosis present

## 2021-09-07 DIAGNOSIS — E1165 Type 2 diabetes mellitus with hyperglycemia: Secondary | ICD-10-CM | POA: Insufficient documentation

## 2021-09-07 LAB — CBC
HCT: 36.8 % (ref 36.0–46.0)
Hemoglobin: 12.2 g/dL (ref 12.0–15.0)
MCH: 25.3 pg — ABNORMAL LOW (ref 26.0–34.0)
MCHC: 33.2 g/dL (ref 30.0–36.0)
MCV: 76.3 fL — ABNORMAL LOW (ref 80.0–100.0)
Platelets: 265 10*3/uL (ref 150–400)
RBC: 4.82 MIL/uL (ref 3.87–5.11)
RDW: 13 % (ref 11.5–15.5)
WBC: 7.2 10*3/uL (ref 4.0–10.5)
nRBC: 0 % (ref 0.0–0.2)

## 2021-09-07 LAB — URINALYSIS, ROUTINE W REFLEX MICROSCOPIC
Bacteria, UA: NONE SEEN
Bilirubin Urine: NEGATIVE
Glucose, UA: >=500 mg/dL — AB
Hgb urine dipstick: NEGATIVE
Ketones, ur: NEGATIVE mg/dL
Leukocytes,Ua: NEGATIVE
Nitrite: NEGATIVE
Protein, ur: NEGATIVE mg/dL
Specific Gravity, Urine: 1.016 (ref 1.005–1.030)
pH: 5 (ref 5.0–8.0)

## 2021-09-07 LAB — BASIC METABOLIC PANEL
Anion gap: 8 (ref 5–15)
BUN: 20 mg/dL (ref 8–23)
CO2: 26 mmol/L (ref 22–32)
Calcium: 9.1 mg/dL (ref 8.9–10.3)
Chloride: 95 mmol/L — ABNORMAL LOW (ref 98–111)
Creatinine, Ser: 1.11 mg/dL — ABNORMAL HIGH (ref 0.44–1.00)
GFR, Estimated: 55 mL/min — ABNORMAL LOW (ref >60)
Glucose, Bld: 457 mg/dL — ABNORMAL HIGH (ref 70–99)
Potassium: 4 mmol/L (ref 3.5–5.1)
Sodium: 129 mmol/L — ABNORMAL LOW (ref 135–145)

## 2021-09-07 LAB — TROPONIN I (HIGH SENSITIVITY)
Troponin I (High Sensitivity): 4 ng/L (ref <18)
Troponin I (High Sensitivity): 5 ng/L (ref <18)

## 2021-09-07 LAB — CBG MONITORING, ED: Glucose-Capillary: 458 mg/dL — ABNORMAL HIGH (ref 70–99)

## 2021-09-07 NOTE — ED Triage Notes (Signed)
Pt reports hyperglycemia x 4 days, high as 540. Also reports chest and head pain since yesterday. Centralized chest pain non radiating, denies sob

## 2021-09-07 NOTE — Telephone Encounter (Signed)
Caller states her blood sugar has been high for the past week and she is concerned and afraid and she has to go to work tonight. Caller states she is wanting advice on how to get it down. Caller states her last reading was 564, she is having headaches and scared to go to sleep.  ---Caller states that her blood sugar has been elevated for the past week. Currently 564 with headaches  09/06/2021 5:51:38 PM Go to ED Now  Felton Clinton, RN, Maris[facility] [place] Disposition Overriden: Go to ED Now (or PCP triage) Override Reason: Patient's symptoms need a higher level of care Caller Complies Caller Understands Yes  Comments User: Foye Clock, RN Date/Time Eilene Ghazi Time): 09/06/2021 5:49:17 PM Reports chest pain earlier today  Referrals GO TO FACILITY OTHER - SPECIFY  09/07/21 1403: Pt states she did not go to ED. States her BG is 428 now. Last OV 04/11/21 recommendation was to f/u in 1-45mo.  F/u appt scheduled for 11/30.

## 2021-09-07 NOTE — ED Provider Notes (Signed)
Emergency Medicine Provider Triage Evaluation Note  Rachel Fields , a 65 y.o. female  was evaluated in triage.  Pt complains of hyperglycemia and chest pain.  The chest pain started 2 days ago, it comes and goes and is substernal.  No nausea or vomiting or shortness of breath.  Patient takes metformin for diabetes, has missed any doses.  Concerned about hyperglycemia..  Review of Systems  Positive: ABOVE Negative: ABOVE  Physical Exam  BP 122/74   Pulse 86   Temp 98.7 F (37.1 C) (Oral)   Resp 16   SpO2 100%  Gen:   Awake, no distress   Resp:  Normal effort  MSK:   Moves extremities without difficulty  Other:  S1-S2  Medical Decision Making  Medically screening exam initiated at 8:18 PM.  Appropriate orders placed.  Amar Keenum was informed that the remainder of the evaluation will be completed by another provider, this initial triage assessment does not replace that evaluation, and the importance of remaining in the ED until their evaluation is complete.  Chest pain work-up, hyperglycemia work-up   Sherrill Raring, Hershal Coria 09/07/21 2019    Lacretia Leigh, MD 09/08/21 [phone]

## 2021-09-08 LAB — CBG MONITORING, ED
Glucose-Capillary: 458 mg/dL — ABNORMAL HIGH (ref 70–99)
Glucose-Capillary: 384 mg/dL — ABNORMAL HIGH (ref 70–99)
Glucose-Capillary: 241 mg/dL — ABNORMAL HIGH (ref 70–99)

## 2021-09-08 MED ORDER — METFORMIN HCL 500 MG PO TABS: 1000.0000 mg | ORAL_TABLET | Freq: Two times a day (BID) | ORAL | 3 refills | Status: DC

## 2021-09-08 MED ORDER — SODIUM CHLORIDE 0.9 % IV BOLUS
1000.0000 mL | Freq: Once | INTRAVENOUS | Status: AC
  Administered 2021-09-08: 1000 mL via INTRAVENOUS

## 2021-09-08 MED ORDER — INSULIN ASPART 100 UNIT/ML IJ SOLN
10.0000 [IU] | Freq: Once | INTRAMUSCULAR | Status: AC
  Administered 2021-09-08: 10 [IU] via INTRAVENOUS

## 2021-09-08 NOTE — ED Notes (Signed)
Pt verbalized understanding of discharge paperwork, prescription and follow-up care.

## 2021-09-08 NOTE — ED Provider Notes (Signed)
[facility] EMERGENCY DEPARTMENT Provider Note   CSN: [id number] Arrival date & time: 09/07/21  1928     History Chief Complaint  Patient presents with   Hyperglycemia   Chest Pain    Rachel Fields is a 65 y.o. female.  HPI 65 yo female 2 diabetes diagnosed in the last year, taking metformin, complains of elevated blood sugars.  She states that she just found her blood sugar monitor 5 days ago.  Since that time she has been monitoring her blood sugars and they have been significantly elevated in the 400-600 range.  She endorses frequency of urination and dysuria.  She has had some intermittent sharp chest pain.  She denies fever, cough, dyspnea, abdominal pain, nausea, vomiting, diarrhea.     Past Medical History:  Diagnosis Date   Allergy    Anxiety    Anxiety and depression    Arthritis    Depression    HTN (hypertension)    Hx of iron deficiency anemia    reports [place] trait; reports on iron her whole life   Hypertension    Low back pain    chronic, reports hx DDD treated by chiropractor   No blood products    Jehovah's Witness   OAB (overactive bladder)    Seasonal allergies    Sickle cell trait ([place])    Uterine fibroid     Patient Active Problem List   Diagnosis Date Noted   Unilateral primary osteoarthritis, right hip 09/27/2018   Diabetes mellitus without complication ([place]) 07/31/1172   Hypertension associated with diabetes ([place]) 12/08/2017   BMI 32.0-32.9,adult 12/08/2017   Seasonal allergies 01/21/2015   OAB (overactive bladder) 01/21/2015   Hyperlipidemia associated with type 2 diabetes mellitus ([place]) 02/14/2014   Obesity 12/19/2009   LOW BACK PAIN 03/18/2008   DEPRESSION/ANXIETY 06/15/2007   HYPERTENSION, BENIGN 06/15/2007    Past Surgical History:  Procedure Laterality Date   BUNIONECTOMY Bilateral 1987   CESAREAN SECTION     Teeth extracted   01/14/2017   TUBAL LIGATION     TUMOR REMOVAL     behind left eye     OB History    No obstetric history on file.     Family History  Problem Relation Age of Onset   Diabetes Father    Hypertension Father    Dementia Father        senile   Other Mother        smoker   Lung cancer Mother        2010   Bipolar disorder Sister    Schizophrenia Sister    Schizophrenia Sister    Bipolar disorder Sister    Schizophrenia Sister    Bipolar disorder Sister    Bipolar disorder Daughter    Colon cancer Neg Hx     Social History   Tobacco Use   Smoking status: Never   Smokeless tobacco: Never  Vaping Use   Vaping Use: Never used  Substance Use Topics   Alcohol use: Yes    Comment: socially - rare; 2 drinks a few times per month   Drug use: No    Home Medications Prior to Admission medications   Medication Sig Start Date End Date Taking? Authorizing Provider  amLODipine (NORVASC) 10 MG tablet Take 1 tablet (10 mg total) by mouth daily. 05/12/21   Billie Ruddy, MD  benzonatate (TESSALON PERLES) 100 MG capsule Take 1 capsule (100 mg total) by mouth 3 (three) times daily  as needed. 06/16/21   Lucretia Kern, DO  blood glucose meter kit and supplies KIT Dispense based on patient and insurance preference. Use up to four times daily as directed. 08/04/21   Billie Ruddy, MD  calcium-vitamin D (CALCIUM + D) 250-125 MG-UNIT per tablet Take 1 tablet by mouth daily. 02/14/14   Robbie Lis, MD  Continuous Blood Gluc Sensor (FREESTYLE LIBRE 2 SENSOR) MISC Apply 1 sensor to skin every 2 weeks as directed for glucose monitoring. 08/20/21   Billie Ruddy, MD  cyclobenzaprine (FLEXERIL) 5 MG tablet Take 1-2 tablets (5-10 mg total) by mouth 2 (two) times daily as needed for muscle spasms. 06/11/20   Hayden Rasmussen, MD  cyclobenzaprine (FLEXERIL) 5 MG tablet Take 1-2 tablets (5-10 mg total) by mouth at bedtime as needed. 02/10/21     diclofenac sodium (VOLTAREN) 1 % GEL Apply 2 g topically 4 (four) times daily. 12/19/17   Leandrew Koyanagi, MD  Fe Fum-FA-B Cmp-C-Zn-Mg-Mn-Cu  (HEMOCYTE PLUS) 106-1 MG CAPS Take 1 capsule by mouth daily. 02/14/14   Robbie Lis, MD  fluconazole (DIFLUCAN) 150 MG tablet Take one tab now.  Repeat dose in 3 days for continued symptoms. Patient taking differently: Take one tab now.  Repeat dose in 3 days for continued symptoms. 12/11/20   Billie Ruddy, MD  fluticasone (FLONASE) 50 MCG/ACT nasal spray Place 1 spray into both nostrils daily. 05/07/21   Billie Ruddy, MD  irbesartan (AVAPRO) 150 MG tablet Take 1 tablet (150 mg total) by mouth daily. 08/04/21   Billie Ruddy, MD  meloxicam (MOBIC) 15 MG tablet Take 1 tablet by mouth once a day with food for inflammation. (Start afer completion of medrol dose pack) 01/19/21     meloxicam (MOBIC) 7.5 MG tablet Take 1 tablet (7.5 mg total) by mouth daily. 06/25/20   Billie Ruddy, MD  metaxalone (SKELAXIN) 800 MG tablet TAKE 1 TABLET BY MOUTH THREE TIMES A DAY 08/25/20   Aundra Dubin, PA-C  metFORMIN (GLUCOPHAGE) 500 MG tablet Take 2 tablets (1,000 mg total) by mouth 2 (two) times daily with a meal. 09/08/21   Pattricia Boss, MD  methocarbamol (ROBAXIN) 500 MG tablet Take 1 tablet by mouth every six hours as needed for muscle tension/spasms (daytime use) 01/19/21     naproxen (NAPROSYN) 500 MG tablet Take 1 tablet (500 mg total) by mouth 2 (two) times daily as needed for moderate pain. 09/18/20   Billie Ruddy, MD  OVER THE COUNTER MEDICATION Take 2 tablets by mouth daily. Biotin 5080mg    [provider]  rosuvastatin (CRESTOR) 10 MG tablet Take 1 tablet (10 mg total) by mouth daily. 05/12/21   BBillie Ruddy MD  tolterodine (DETROL LA) 4 MG 24 hr capsule TAKE 1 CAPSULE BY MOUTH  DAILY 07/20/21   BBillie Ruddy MD  venlafaxine XR (EFFEXOR-XR) 75 MG 24 hr capsule TAKE 3 CAPSULES EVERY MORNING WITH BREAKFAST 05/12/21   BBillie Ruddy MD  lisinopril (ZESTRIL) 20 MG tablet Take 1 tablet (20 mg total) by mouth daily. 04/10/20 05/13/20  BBillie Ruddy MD    Allergies    Patient  has no known allergies.  Review of Systems   Review of Systems  All other systems reviewed and are negative.  Physical Exam Updated Vital Signs BP 125/63   Pulse 83   Temp 98.8 F (37.1 C) (Oral)   Resp 17   SpO2 97%   Physical Exam  Vitals and nursing note reviewed.  Constitutional:      General: She is not in acute distress.    Appearance: She is well-developed.  HENT:     Head: Normocephalic and atraumatic.     Right Ear: External ear normal.     Left Ear: External ear normal.     Nose: Nose normal.  Eyes:     Conjunctiva/sclera: Conjunctivae normal.     Pupils: Pupils are equal, round, and reactive to light.  Cardiovascular:     Rate and Rhythm: Normal rate and regular rhythm.     Heart sounds: Normal heart sounds.  Pulmonary:     Effort: Pulmonary effort is normal.     Breath sounds: Normal breath sounds.  Abdominal:     General: Bowel sounds are normal.     Palpations: Abdomen is soft.  Musculoskeletal:        General: Normal range of motion.     Cervical back: Normal range of motion and neck supple.  Skin:    General: Skin is warm and dry.     Capillary Refill: Capillary refill takes less than 2 seconds.  Neurological:     General: No focal deficit present.     Mental Status: She is alert and oriented to person, place, and time.     Motor: No abnormal muscle tone.     Coordination: Coordination normal.  Psychiatric:        Mood and Affect: Mood normal.        Behavior: Behavior normal.        Thought Content: Thought content normal.    ED Results / Procedures / Treatments   Labs (all labs ordered are listed, but only abnormal results are displayed) Labs Reviewed  BASIC METABOLIC PANEL - Abnormal; Notable for the following components:      Result Value   Sodium 129 (*)    Chloride 95 (*)    Glucose, Bld 457 (*)    Creatinine, Ser 1.11 (*)    GFR, Estimated 55 (*)    All other components within normal limits  CBC - Abnormal; Notable for the  following components:   MCV 76.3 (*)    MCH 25.3 (*)    All other components within normal limits  URINALYSIS, ROUTINE W REFLEX MICROSCOPIC - Abnormal; Notable for the following components:   Glucose, UA >=500 (*)    All other components within normal limits  CBG MONITORING, ED - Abnormal; Notable for the following components:   Glucose-Capillary 458 (*)    All other components within normal limits  CBG MONITORING, ED - Abnormal; Notable for the following components:   Glucose-Capillary 458 (*)    All other components within normal limits  CBG MONITORING, ED - Abnormal; Notable for the following components:   Glucose-Capillary 384 (*)    All other components within normal limits  CBG MONITORING, ED  CBG MONITORING, ED  TROPONIN I (HIGH SENSITIVITY)  TROPONIN I (HIGH SENSITIVITY)    EKG EKG Interpretation  Date/Time:  Monday September 07 2021 19:48:31 EST Ventricular Rate:  82 PR Interval:  168 QRS Duration: 80 QT Interval:  356 QTC Calculation: 415 R Axis:   46 Text Interpretation: Normal sinus rhythm Normal ECG No significant change since last tracing Confirmed by Calvert Cantor [phone]) on 09/08/2021 9:04:04 AM  Radiology DG Chest 2 View  Result Date: 09/07/2021 CLINICAL DATA:  Chest pain. EXAM: CHEST - 2 VIEW COMPARISON:  Chest x-Jaylena Holloway 01/18/2019. FINDINGS: The heart size and  mediastinal contours are within normal limits. Both lungs are clear. The visualized skeletal structures are unremarkable. IMPRESSION: No active cardiopulmonary disease. Electronically Signed   By: Ronney Asters M.D.   On: 09/07/2021 20:38    Procedures Procedures   Medications Ordered in ED Medications  sodium chloride 0.9 % bolus 1,000 mL (0 mLs Intravenous Stopped 09/08/21 0936)  insulin aspart (novoLOG) injection 10 Units (10 Units Intravenous Given 09/08/21 1028)    ED Course  I have reviewed the triage vital signs and the nursing notes.  Pertinent labs & imaging results that were  available during my care of the patient were reviewed by me and considered in my medical decision making (see chart for details).    MDM Rules/Calculators/A&P                         65 year old female known diabetes who is recently started following her blood sugars.  She is found to be quite elevated.  Here in the ED her initial blood sugar was 457.  However, there is no evidence of DKA.  She is given IV fluids and insulin.  Blood sugars coming down to 384.  We Discussed diet and exercise.  Patient was hungry and ate a blueberry muffin just prior to my evaluation.  She is taking 500 mg of Glucophage twice a day.  We will increase to thousand twice daily.   Final Clinical Impression(s) / ED Diagnoses Final diagnoses:  Hyperglycemia    Rx / DC Orders ED Discharge Orders          Ordered    metFORMIN (GLUCOPHAGE) 500 MG tablet  2 times daily with meals        09/08/21 1152             Pattricia Boss, MD 09/09/21 1021

## 2021-09-08 NOTE — Discharge Instructions (Signed)
Please increase metformin to 1000 mg twice a day.  A new prescription has been sent to your pharmacy. Drink only water Eat diet mainly consisting of vegetables and beans Cut out all simple carbohydrates such as bread, muffins, cakes, and cookies. Please call your doctor for recheck in the next 1 to 2 weeks.

## 2021-09-09 ENCOUNTER — Ambulatory Visit: Payer: No Typology Code available for payment source | Admitting: Family Medicine

## 2021-09-18 ENCOUNTER — Telehealth: Payer: Self-pay

## 2021-09-18 NOTE — Telephone Encounter (Signed)
Per Dr Volanda Napoleon, not enough information in phone note, pt needs appointment.

## 2021-09-18 NOTE — Telephone Encounter (Signed)
Patient called stating her B/p is running hi and patient is afraid to go to sleep because she is home alone an afraid she wont wake up,  patient also stated she needed a Dr's note for work taking her off 3rd shift.  Patient was transferred to a triage nurse.

## 2021-09-23 ENCOUNTER — Ambulatory Visit (INDEPENDENT_AMBULATORY_CARE_PROVIDER_SITE_OTHER): Payer: No Typology Code available for payment source | Admitting: Family Medicine

## 2021-09-23 ENCOUNTER — Encounter: Payer: Self-pay | Admitting: Family Medicine

## 2021-09-23 VITALS — BP 146/82 | HR 76 | Temp 98.4°F | Wt 184.6 lb

## 2021-09-23 DIAGNOSIS — E1165 Type 2 diabetes mellitus with hyperglycemia: Secondary | ICD-10-CM | POA: Diagnosis not present

## 2021-09-23 DIAGNOSIS — I10 Essential (primary) hypertension: Secondary | ICD-10-CM

## 2021-09-23 DIAGNOSIS — Z91199 Patient's noncompliance with other medical treatment and regimen due to unspecified reason: Secondary | ICD-10-CM | POA: Diagnosis not present

## 2021-09-23 LAB — POCT GLYCOSYLATED HEMOGLOBIN (HGB A1C): Hemoglobin A1C: 13.8 % — AB (ref 4.0–5.6)

## 2021-09-23 LAB — GLUCOSE, POCT (MANUAL RESULT ENTRY): POC Glucose: 475 mg/dl — AB (ref 70–99)

## 2021-09-23 MED ORDER — GLIPIZIDE 5 MG PO TABS: 5.0000 mg | ORAL_TABLET | Freq: Two times a day (BID) | ORAL | 1 refills | Status: DC

## 2021-09-23 NOTE — Patient Instructions (Addendum)
Your hemoglobin A1c was 13.8% this visit.  I have sent in a prescription for glipizide 5 mg [facility] a day [facility] [place].  You should take this with the metformin 1000 mg twice a day.  At your next visit we will need to further adjust your medications.  It will be highly unlikely that we can control your blood sugar with pills alone given how high it is.  It is imperative that you work on diet changes and try to be consistent with taking your medications.  Without knowing what your blood sugar is at home it makes it difficult to make adjustments to your diabetes medicines.    We will have you follow-up in clinic in the next 3 to 4 weeks.

## 2021-09-23 NOTE — Progress Notes (Signed)
Subjective:    Patient ID: Rachel Fields, female    DOB: [birth year], 65 y.o.   MRN: [id number]  Chief Complaint  Patient presents with   Blood Sugar Problem    Fluctuates, cannot get it to regulate.  Wants to discuss a note taking her off of third shift.    HPI Patient was seen today for f/u from ED 09/07/21 and on chronic issues.  Seen in ED for hyperglycemia (457) without DKA.  Given IVFs and insulin.  Metformin increased from 500 mg daily to 1000 mg BID.  Pt states her bs has been up and down.  Has been in the 300s.  Pt as not checked fsbs recently 2/2 misplacing her meter during a move.  Pt notes financial stress.  Eating what is convenient.  Past Medical History:  Diagnosis Date   Allergy    Anxiety    Anxiety and depression    Arthritis    Depression    HTN (hypertension)    Hx of iron deficiency anemia    reports [place] trait; reports on iron her whole life   Hypertension    Low back pain    chronic, reports hx DDD treated by chiropractor   No blood products    Jehovah's Witness   OAB (overactive bladder)    Seasonal allergies    Sickle cell trait ([facility])    Uterine fibroid     No Known Allergies  ROS General: Denies fever, chills, night sweats, changes in weight, changes in appetite HEENT: Denies headaches, ear pain, changes in vision, rhinorrhea, sore throat +HAs CV: Denies CP, palpitations, SOB, orthopnea Pulm: Denies SOB, cough, wheezing GI: Denies abdominal pain, nausea, vomiting, diarrhea, constipation GU: Denies dysuria, hematuria, frequency, vaginal discharge Msk: Denies muscle cramps, joint pains Neuro: Denies weakness, numbness, tingling Skin: Denies rashes, bruising Psych: Denies depression, anxiety, hallucinations    Objective:    Blood pressure (!) 146/82, pulse 76, temperature 98.4 F (36.9 C), temperature source Oral, weight 184 lb 9.6 oz (83.7 kg), SpO2 99 %.  Gen. Pleasant, well-nourished, in no distress, normal affect   HEENT: [place]/AT, face  symmetric, conjunctiva clear, no scleral icterus, PERRLA, EOMI, nares patent without drainage Lungs: no accessory muscle use, CTAB, no wheezes or rales Cardiovascular: RRR, no m/r/g, no peripheral edema Musculoskeletal: No deformities, no cyanosis or clubbing, normal tone Neuro:  A&Ox3, CN II-XII intact, normal gait Skin:  Warm, no lesions/ rash   Wt Readings from Last 3 Encounters:  09/23/21 184 lb 9.6 oz (83.7 kg)  05/07/21 189 lb 12.8 oz (86.1 kg)  12/11/20 182 lb (82.6 kg)    Lab Results  Component Value Date   WBC 7.2 09/07/2021   HGB 12.2 09/07/2021   HCT 36.8 09/07/2021   PLT 265 09/07/2021   GLUCOSE 457 (H) 09/07/2021   CHOL 147 05/07/2021   TRIG 49.0 05/07/2021   HDL 67.70 05/07/2021   LDLCALC 70 05/07/2021   ALT 23 01/28/2021   AST 17 01/28/2021   NA 129 (L) 09/07/2021   K 4.0 09/07/2021   CL 95 (L) 09/07/2021   CREATININE 1.11 (H) 09/07/2021   BUN 20 09/07/2021   CO2 26 09/07/2021   TSH 1.36 05/07/2021   HGBA1C 13.8 (A) 09/23/2021   MICROALBUR 1.4 05/16/2020    Assessment/Plan:  Uncontrolled type 2 diabetes mellitus with hyperglycemia ([place])  -uncontrolled 2/2 diet, compliance, and lack of f/u complicated by financial strain -Hemoglobin A1c 13.8% this visit -discussed the importance of lifestyle modifications and close f/u. -  continue metformin 1000 mg twice daily -We will start glipizide 5 mg twice daily -Patient advised to locate her glucometer, start checking her blood sugars daily, and keep a log to bring with her to clinic. -Continue ARB and statin - Plan: POC Glucose (CBG), POC HgB A1c, glipiZIDE (GLUCOTROL) 5 MG tablet  Essential hypertension -Uncontrolled -Lifestyle modification strongly encouraged -Continue current medications including irbesartan 150 mg, Norvasc 10 mg  Poor compliance -Discussed the importance of lifestyle modifications, checking FSBS regularly, following up when advised, and taking medications  F/u in 3-4 wks  Grier Mitts, MD

## 2021-09-30 ENCOUNTER — Telehealth: Payer: Self-pay | Admitting: Family Medicine

## 2021-09-30 DIAGNOSIS — I10 Essential (primary) hypertension: Secondary | ICD-10-CM

## 2021-09-30 DIAGNOSIS — N3281 Overactive bladder: Secondary | ICD-10-CM

## 2021-09-30 NOTE — Telephone Encounter (Signed)
Patient stated that pharmacy advised her to call to get a refill for venlafaxine XR (EFFEXOR-XR) 75 MG 24 hr capsule [[id number]]  and tolterodine (DETROL LA) 4 MG 24 hr capsule [[id number]].  Patient would like to be contacted at [phone] when this is complete.  Please advise.

## 2021-09-30 NOTE — Telephone Encounter (Signed)
Pt called and her BS is 400 pt was sent to access nurse and pt was told to go to ER. Pt called back and request to speak with dr banks and was told to go to ER . Pt stated she will go to er but needs a note because she works 3rd shift . I told pt ER can give her a work note.

## 2021-09-30 NOTE — Telephone Encounter (Signed)
Refills were sent in for venlafaxine on 8/02 for 90 day supply and 2 refills; Detrol LA was filled 10/10 with a one year supply.

## 2021-10-01 NOTE — Telephone Encounter (Signed)
Pt is calling again and want her medication sent because she lost it when she was moving also want her bp med sent she stated she need It ASAP.

## 2021-10-02 MED ORDER — IRBESARTAN 150 MG PO TABS: 150.0000 mg | ORAL_TABLET | Freq: Every day | ORAL | 3 refills | Status: DC

## 2021-10-02 MED ORDER — VENLAFAXINE HCL ER 75 MG PO CP24: ORAL_CAPSULE | ORAL | 0 refills | Status: DC

## 2021-10-02 MED ORDER — TOLTERODINE TARTRATE ER 4 MG PO CP24: 4.0000 mg | ORAL_CAPSULE | Freq: Every day | ORAL | 0 refills | Status: DC

## 2021-10-02 NOTE — Telephone Encounter (Signed)
Spoke with patient and reviewed refills requested.  Refills sent.

## 2021-10-14 ENCOUNTER — Ambulatory Visit: Payer: No Typology Code available for payment source | Admitting: Family Medicine

## 2021-10-23 ENCOUNTER — Other Ambulatory Visit: Payer: Self-pay | Admitting: Family Medicine

## 2021-10-23 DIAGNOSIS — E1165 Type 2 diabetes mellitus with hyperglycemia: Secondary | ICD-10-CM

## 2021-11-09 ENCOUNTER — Other Ambulatory Visit: Payer: Self-pay

## 2021-11-09 ENCOUNTER — Telehealth: Payer: Self-pay | Admitting: Family Medicine

## 2021-11-09 DIAGNOSIS — I10 Essential (primary) hypertension: Secondary | ICD-10-CM

## 2021-11-09 DIAGNOSIS — E1165 Type 2 diabetes mellitus with hyperglycemia: Secondary | ICD-10-CM

## 2021-11-09 DIAGNOSIS — E782 Mixed hyperlipidemia: Secondary | ICD-10-CM

## 2021-11-09 DIAGNOSIS — N3281 Overactive bladder: Secondary | ICD-10-CM

## 2021-11-09 MED ORDER — IRBESARTAN 150 MG PO TABS: 150.0000 mg | ORAL_TABLET | Freq: Every day | ORAL | 3 refills | Status: DC

## 2021-11-09 MED ORDER — FREESTYLE LIBRE 2 SENSOR MISC: 11 refills | Status: AC

## 2021-11-09 MED ORDER — ROSUVASTATIN CALCIUM 10 MG PO TABS: 10.0000 mg | ORAL_TABLET | Freq: Every day | ORAL | 3 refills | Status: DC

## 2021-11-09 MED ORDER — TOLTERODINE TARTRATE ER 4 MG PO CP24: 4.0000 mg | ORAL_CAPSULE | Freq: Every day | ORAL | 0 refills | Status: DC

## 2021-11-09 MED ORDER — VENLAFAXINE HCL ER 75 MG PO CP24: ORAL_CAPSULE | ORAL | 0 refills | Status: DC

## 2021-11-09 MED ORDER — AMLODIPINE BESYLATE 10 MG PO TABS: 10.0000 mg | ORAL_TABLET | Freq: Every day | ORAL | 3 refills | Status: DC

## 2021-11-09 NOTE — Telephone Encounter (Signed)
Patient is requesting a refill for venlafaxine XR (EFFEXOR-XR) 75 MG 24 hr capsule [[id number]]  to be sent to her pharmacy.   Patient would like to be contacted [phone].  Please advise.

## 2021-11-09 NOTE — Telephone Encounter (Signed)
venlafaxine XR refilled on 10/02/21 for 270 pills (16mo supply). Pt notified of this & states she does not have any pills left; states she is in the process of moving & noted that she has "explained this before". Pt states she wouldn't be asking for a refill if she had the pills.  Called pharmacy who states medication that was refilled Dec 23 was put on hold b/c pt did not pick it up.  Pt called & notified of above. Pt appreciative & states she will go to pharmacy to pick it up.

## 2021-11-18 ENCOUNTER — Other Ambulatory Visit: Payer: Self-pay

## 2021-11-18 DIAGNOSIS — E1165 Type 2 diabetes mellitus with hyperglycemia: Secondary | ICD-10-CM

## 2021-11-18 MED ORDER — GLIPIZIDE 5 MG PO TABS: ORAL_TABLET | ORAL | 1 refills | Status: DC

## 2021-12-09 ENCOUNTER — Other Ambulatory Visit: Payer: Self-pay | Admitting: Family Medicine

## 2021-12-09 DIAGNOSIS — E1165 Type 2 diabetes mellitus with hyperglycemia: Secondary | ICD-10-CM

## 2022-01-15 ENCOUNTER — Other Ambulatory Visit: Payer: Self-pay | Admitting: Family Medicine

## 2022-01-15 DIAGNOSIS — N3281 Overactive bladder: Secondary | ICD-10-CM

## 2022-02-03 ENCOUNTER — Other Ambulatory Visit: Payer: Self-pay | Admitting: Family Medicine

## 2022-02-04 ENCOUNTER — Other Ambulatory Visit: Payer: Self-pay | Admitting: Family Medicine

## 2022-02-04 DIAGNOSIS — E1165 Type 2 diabetes mellitus with hyperglycemia: Secondary | ICD-10-CM

## 2022-02-09 ENCOUNTER — Telehealth: Payer: Self-pay | Admitting: Family Medicine

## 2022-02-15 NOTE — Telephone Encounter (Signed)
Error/njr °

## 2022-02-18 ENCOUNTER — Telehealth: Payer: Self-pay | Admitting: Family Medicine

## 2022-02-18 NOTE — Telephone Encounter (Signed)
Attempted to contact patient to inform her that Dr.Banks does not do disability forms, so she will not be completing the paperwork she dropped off and she can come pick up her incomplete forms. Did not get an answer and mailbox was full. Incomplete forms were left in filing cabinet. ? ? ? ? ?FYI  ?

## 2022-02-18 NOTE — Telephone Encounter (Signed)
Patient dropped off paperwork to be filled out. Paperwork has been put in folder for completion. ? ? ? ? ? ?Please advise  ?

## 2022-02-19 NOTE — Telephone Encounter (Signed)
Gave paperwork to provider, provider states she does not complete disability.  ?
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
## 2022-03-31 ENCOUNTER — Encounter: Payer: PRIVATE HEALTH INSURANCE | Admitting: Family Medicine

## 2022-05-06 ENCOUNTER — Encounter: Payer: No Typology Code available for payment source | Admitting: Family Medicine

## 2022-06-03 ENCOUNTER — Encounter: Payer: No Typology Code available for payment source | Admitting: Family Medicine

## 2022-06-16 ENCOUNTER — Encounter: Payer: No Typology Code available for payment source | Admitting: Family Medicine
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
## 2022-10-21 ENCOUNTER — Telehealth: Payer: Self-pay | Admitting: Family Medicine

## 2022-10-21 DIAGNOSIS — E1165 Type 2 diabetes mellitus with hyperglycemia: Secondary | ICD-10-CM

## 2022-10-21 DIAGNOSIS — E782 Mixed hyperlipidemia: Secondary | ICD-10-CM

## 2022-10-21 DIAGNOSIS — I10 Essential (primary) hypertension: Secondary | ICD-10-CM

## 2022-10-21 NOTE — Telephone Encounter (Addendum)
Pt called to request refills of the following:  metFORMIN (GLUCOPHAGE) 500 MG tablet  tolterodine (DETROL LA) 4 MG 24 hr capsule  rosuvastatin (CRESTOR) 10 MG tablet  amLODipine (NORVASC) 10 MG tablet  venlafaxine XR (EFFEXOR-XR) 75 MG 24 hr capsule  glipiZIDE (GLUCOTROL) 5 MG tablet  calcium-vitamin D (CALCIUM + D) 250-125 MG-UNIT per tablet  diclofenac sodium (VOLTAREN) 1 % GEL  calcium-vitamin D (CALCIUM + D) 250-125 MG-UNIT per tablet  Iron - ferrous sulfate  Pt has not been seen since:  09/23/2021.  Pt was scheduled for a CPE on 11/03/22.    Please advise.  [id number] [place] [place] - [address]: [phone] Fax: [phone]

## 2022-10-25 NOTE — Telephone Encounter (Signed)
Will refill at upcoming appt

## 2022-10-25 NOTE — Telephone Encounter (Signed)
Pt states she is completely out of them. Her insurance change last year. She needs a refill on maintenance medications.   Please advise.

## 2022-10-26 NOTE — Telephone Encounter (Signed)
Patient calling requesting these refills, she is out, does not want to go any longer without,

## 2022-10-29 ENCOUNTER — Telehealth: Payer: Self-pay | Admitting: Family Medicine

## 2022-10-29 MED ORDER — ROSUVASTATIN CALCIUM 10 MG PO TABS: 10.0000 mg | ORAL_TABLET | Freq: Every day | ORAL | 3 refills | Status: AC

## 2022-10-29 MED ORDER — GLIPIZIDE 5 MG PO TABS: 5.0000 mg | ORAL_TABLET | Freq: Two times a day (BID) | ORAL | 0 refills | Status: AC

## 2022-10-29 MED ORDER — AMLODIPINE BESYLATE 10 MG PO TABS: 10.0000 mg | ORAL_TABLET | Freq: Every day | ORAL | 0 refills | Status: AC

## 2022-10-29 MED ORDER — METFORMIN HCL 500 MG PO TABS: 1000.0000 mg | ORAL_TABLET | Freq: Two times a day (BID) | ORAL | 0 refills | Status: AC

## 2022-10-29 MED ORDER — IRBESARTAN 150 MG PO TABS: 150.0000 mg | ORAL_TABLET | Freq: Every day | ORAL | 0 refills | Status: AC

## 2022-10-29 NOTE — Telephone Encounter (Signed)
Okay to send in 30-day supply with 0 refills until patient's appointment next week.

## 2022-10-29 NOTE — Addendum Note (Signed)
Addended byEncarnacion Slates on: 10/29/2022 10:28 AM   Modules accepted: Orders

## 2022-10-29 NOTE — Telephone Encounter (Signed)
Rx sent for 30 days.

## 2022-10-29 NOTE — Telephone Encounter (Signed)
Attempt to inform pt. Voicemail is full.

## 2022-10-29 NOTE — Telephone Encounter (Signed)
Error/njr °

## 2022-11-01 ENCOUNTER — Other Ambulatory Visit: Payer: Self-pay | Admitting: Family Medicine

## 2022-11-01 DIAGNOSIS — N3281 Overactive bladder: Secondary | ICD-10-CM

## 2022-11-02 ENCOUNTER — Telehealth: Payer: Self-pay | Admitting: Family Medicine

## 2022-11-02 NOTE — Telephone Encounter (Signed)
Pt needs a refill on Detrol and Effexor.  When dr calls in medication please call pt.  She is out of medications.  She is wanting these to be picked up by 2:30 today because she has to go to work.    [facility] [address], [place]

## 2022-11-02 NOTE — Telephone Encounter (Signed)
Rx was sent for limited supply. Further refills can do done at her visit tomorrow.  Attempt to reach pt twice. Voicemail is full.

## 2022-11-03 ENCOUNTER — Encounter: Payer: No Typology Code available for payment source | Admitting: Family Medicine

## 2022-11-08 ENCOUNTER — Other Ambulatory Visit: Payer: Self-pay | Admitting: Family Medicine

## 2022-11-08 DIAGNOSIS — N3281 Overactive bladder: Secondary | ICD-10-CM

## 2022-11-08 NOTE — Telephone Encounter (Signed)
Pt is calling back and want dr.Banks to send her two wk's supply of Tolterodine tartrate and venlafaxine she stated she have a appt with [facility] on 11/22/22.

## 2022-12-30 ENCOUNTER — Other Ambulatory Visit: Payer: Self-pay | Admitting: Family Medicine

## 2022-12-30 DIAGNOSIS — E1165 Type 2 diabetes mellitus with hyperglycemia: Secondary | ICD-10-CM

## 2022-12-30 DIAGNOSIS — I10 Essential (primary) hypertension: Secondary | ICD-10-CM

## 2024-01-05 ENCOUNTER — Encounter: Payer: Self-pay | Admitting: Family Medicine

## 2024-01-11 ENCOUNTER — Encounter: Payer: Self-pay | Admitting: Family Medicine

## 2024-01-16 ENCOUNTER — Encounter: Payer: Self-pay | Admitting: Family Medicine

## 2024-03-20 ENCOUNTER — Ambulatory Visit: Payer: Self-pay | Admitting: Family Medicine

## 2024-03-20 DIAGNOSIS — Z0289 Encounter for other administrative examinations: Secondary | ICD-10-CM
